# Patient Record
Sex: Female | Born: 1935 | Race: White | Hispanic: No | Marital: Married | Smoking: Former smoker
Health system: Southern US, Community
[De-identification: ages and names within clinical notes are randomized; demographics above are authoritative.]

## PROBLEM LIST (undated history)

## (undated) DIAGNOSIS — I714 Abdominal aortic aneurysm, without rupture, unspecified: Secondary | ICD-10-CM

## (undated) DIAGNOSIS — J449 Chronic obstructive pulmonary disease, unspecified: Secondary | ICD-10-CM

## (undated) DIAGNOSIS — K529 Noninfective gastroenteritis and colitis, unspecified: Secondary | ICD-10-CM

---

## 2017-07-10 ENCOUNTER — Other Ambulatory Visit: Payer: Self-pay

## 2017-07-10 ENCOUNTER — Emergency Department (HOSPITAL_COMMUNITY): Payer: Medicare FFS

## 2017-07-10 ENCOUNTER — Inpatient Hospital Stay (HOSPITAL_COMMUNITY)
Admission: EM | Admit: 2017-07-10 | Discharge: 2017-07-15 | DRG: 872 | Disposition: A | Discharge status: Home / Self Care | Payer: Medicare FFS | Attending: Internal Medicine | Admitting: Internal Medicine

## 2017-07-10 ENCOUNTER — Encounter (HOSPITAL_COMMUNITY): Payer: Self-pay

## 2017-07-10 DIAGNOSIS — I10 Essential (primary) hypertension: Secondary | ICD-10-CM | POA: Diagnosis present

## 2017-07-10 DIAGNOSIS — A419 Sepsis, unspecified organism: Secondary | ICD-10-CM | POA: Diagnosis not present

## 2017-07-10 DIAGNOSIS — J432 Centrilobular emphysema: Secondary | ICD-10-CM | POA: Diagnosis present

## 2017-07-10 DIAGNOSIS — Z79899 Other long term (current) drug therapy: Secondary | ICD-10-CM | POA: Diagnosis not present

## 2017-07-10 DIAGNOSIS — E785 Hyperlipidemia, unspecified: Secondary | ICD-10-CM | POA: Diagnosis present

## 2017-07-10 DIAGNOSIS — K5900 Constipation, unspecified: Secondary | ICD-10-CM | POA: Diagnosis present

## 2017-07-10 DIAGNOSIS — I4510 Unspecified right bundle-branch block: Secondary | ICD-10-CM | POA: Diagnosis present

## 2017-07-10 DIAGNOSIS — R630 Anorexia: Secondary | ICD-10-CM | POA: Diagnosis not present

## 2017-07-10 DIAGNOSIS — Z9981 Dependence on supplemental oxygen: Secondary | ICD-10-CM | POA: Diagnosis not present

## 2017-07-10 DIAGNOSIS — Z87891 Personal history of nicotine dependence: Secondary | ICD-10-CM | POA: Diagnosis not present

## 2017-07-10 DIAGNOSIS — D649 Anemia, unspecified: Secondary | ICD-10-CM | POA: Diagnosis present

## 2017-07-10 DIAGNOSIS — N390 Urinary tract infection, site not specified: Secondary | ICD-10-CM | POA: Diagnosis present

## 2017-07-10 DIAGNOSIS — Z6820 Body mass index (BMI) 20.0-20.9, adult: Secondary | ICD-10-CM | POA: Diagnosis not present

## 2017-07-10 DIAGNOSIS — J90 Pleural effusion, not elsewhere classified: Secondary | ICD-10-CM | POA: Diagnosis present

## 2017-07-10 DIAGNOSIS — I714 Abdominal aortic aneurysm, without rupture: Secondary | ICD-10-CM | POA: Diagnosis present

## 2017-07-10 DIAGNOSIS — R627 Adult failure to thrive: Secondary | ICD-10-CM

## 2017-07-10 DIAGNOSIS — J449 Chronic obstructive pulmonary disease, unspecified: Secondary | ICD-10-CM | POA: Diagnosis not present

## 2017-07-10 DIAGNOSIS — J9811 Atelectasis: Secondary | ICD-10-CM | POA: Diagnosis present

## 2017-07-10 DIAGNOSIS — E876 Hypokalemia: Secondary | ICD-10-CM | POA: Diagnosis present

## 2017-07-10 DIAGNOSIS — J9611 Chronic respiratory failure with hypoxia: Secondary | ICD-10-CM | POA: Diagnosis present

## 2017-07-10 DIAGNOSIS — K5732 Diverticulitis of large intestine without perforation or abscess without bleeding: Secondary | ICD-10-CM | POA: Diagnosis present

## 2017-07-10 DIAGNOSIS — K769 Liver disease, unspecified: Secondary | ICD-10-CM | POA: Diagnosis present

## 2017-07-10 DIAGNOSIS — R05 Cough: Secondary | ICD-10-CM

## 2017-07-10 DIAGNOSIS — E440 Moderate protein-calorie malnutrition: Secondary | ICD-10-CM | POA: Diagnosis not present

## 2017-07-10 DIAGNOSIS — K529 Noninfective gastroenteritis and colitis, unspecified: Secondary | ICD-10-CM | POA: Diagnosis present

## 2017-07-10 DIAGNOSIS — R059 Cough, unspecified: Secondary | ICD-10-CM

## 2017-07-10 DIAGNOSIS — R634 Abnormal weight loss: Secondary | ICD-10-CM

## 2017-07-10 HISTORY — DX: Abdominal aortic aneurysm, without rupture, unspecified: I71.40

## 2017-07-10 HISTORY — DX: Noninfective gastroenteritis and colitis, unspecified: K52.9

## 2017-07-10 HISTORY — DX: Abdominal aortic aneurysm, without rupture: I71.4

## 2017-07-10 HISTORY — DX: Chronic obstructive pulmonary disease, unspecified: J44.9

## 2017-07-10 LAB — URINALYSIS, ROUTINE W REFLEX MICROSCOPIC
Bilirubin Urine: NEGATIVE
Glucose, UA: NEGATIVE mg/dL
Ketones, ur: 80 mg/dL — AB
Nitrite: NEGATIVE
Protein, ur: NEGATIVE mg/dL
Specific Gravity, Urine: >1.046 — ABNORMAL HIGH (ref 1.005–1.030)
pH: 5 (ref 5.0–8.0)

## 2017-07-10 LAB — CBC WITH DIFFERENTIAL/PLATELET
BASOS ABS: 0 10*3/uL (ref 0.0–0.1)
BASOS PCT: 0 %
EOS ABS: 0 10*3/uL (ref 0.0–0.7)
EOS PCT: 0 %
HCT: 35 % — ABNORMAL LOW (ref 36.0–46.0)
Hemoglobin: 11.2 g/dL — ABNORMAL LOW (ref 12.0–15.0)
Lymphocytes Relative: 8 %
Lymphs Abs: 1 10*3/uL (ref 0.7–4.0)
MCH: 28.6 pg (ref 26.0–34.0)
MCHC: 32 g/dL (ref 30.0–36.0)
MCV: 89.3 fL (ref 78.0–100.0)
Monocytes Absolute: 0.9 10*3/uL (ref 0.1–1.0)
Monocytes Relative: 6 %
NEUTROS PCT: 86 %
Neutro Abs: 11.9 10*3/uL (ref 1.7–7.7)
PLATELETS: 514 10*3/uL — AB (ref 150–400)
RBC: 3.92 MIL/uL (ref 3.87–5.11)
RDW: 18.9 % — AB (ref 11.5–15.5)
WBC: 13.9 10*3/uL — AB (ref 4.0–10.5)

## 2017-07-10 LAB — COMPREHENSIVE METABOLIC PANEL
ALK PHOS: 79 U/L (ref 38–126)
ALT: 16 U/L (ref 14–54)
AST: 15 U/L (ref 15–41)
Albumin: 1.5 g/dL — ABNORMAL LOW (ref 3.5–5.0)
Anion gap: 13 (ref 5–15)
BILIRUBIN TOTAL: 1.1 mg/dL (ref 0.3–1.2)
BUN: 15 mg/dL (ref 6–20)
CALCIUM: 8.2 mg/dL — AB (ref 8.9–10.3)
CO2: 24 mmol/L (ref 22–32)
CREATININE: 0.69 mg/dL (ref 0.44–1.00)
Chloride: 100 mmol/L — ABNORMAL LOW (ref 101–111)
Glucose, Bld: 69 mg/dL (ref 65–99)
Potassium: 3.7 mmol/L (ref 3.5–5.1)
SODIUM: 137 mmol/L (ref 135–145)
TOTAL PROTEIN: 5.1 g/dL — AB (ref 6.5–8.1)

## 2017-07-10 LAB — I-STAT CG4 LACTIC ACID, ED: LACTIC ACID, VENOUS: 1.04 mmol/L (ref 0.5–1.9)

## 2017-07-10 LAB — C DIFFICILE QUICK SCREEN W PCR REFLEX
C Diff antigen: NEGATIVE
C Diff interpretation: NOT DETECTED
C Diff toxin: NEGATIVE

## 2017-07-10 LAB — MAGNESIUM: Magnesium: 2 mg/dL (ref 1.7–2.4)

## 2017-07-10 LAB — I-STAT TROPONIN, ED: TROPONIN I, POC: 0.03 ng/mL (ref 0.00–0.08)

## 2017-07-10 LAB — LIPASE, BLOOD: Lipase: 25 U/L (ref 11–51)

## 2017-07-10 LAB — CBG MONITORING, ED: Glucose-Capillary: 53 mg/dL — ABNORMAL LOW (ref 65–99)

## 2017-07-10 LAB — PROTIME-INR
INR: 1
PROTHROMBIN TIME: 13.1 s (ref 11.4–15.2)

## 2017-07-10 LAB — PHOSPHORUS: PHOSPHORUS: 2.3 mg/dL — AB (ref 2.5–4.6)

## 2017-07-10 LAB — BRAIN NATRIURETIC PEPTIDE: B NATRIURETIC PEPTIDE 5: 130.9 pg/mL — AB (ref 0.0–100.0)

## 2017-07-10 MED ORDER — ACETAMINOPHEN 650 MG RE SUPP: 650.0000 mg | Freq: Four times a day (QID) | RECTAL | Status: DC | PRN

## 2017-07-10 MED ORDER — IOPAMIDOL (ISOVUE-300) INJECTION 61%
100.0000 mL | Freq: Once | INTRAVENOUS | Status: AC | PRN
  Administered 2017-07-10: 100 mL via INTRAVENOUS

## 2017-07-10 MED ORDER — DEXTROSE-NACL 5-0.9 % IV SOLN
INTRAVENOUS | Status: DC
  Administered 2017-07-10: 14:00:00 via INTRAVENOUS

## 2017-07-10 MED ORDER — ONDANSETRON HCL 4 MG PO TABS: 4.0000 mg | ORAL_TABLET | Freq: Four times a day (QID) | ORAL | Status: DC | PRN

## 2017-07-10 MED ORDER — METRONIDAZOLE IN NACL 5-0.79 MG/ML-% IV SOLN
500.0000 mg | Freq: Once | INTRAVENOUS | Status: AC
  Administered 2017-07-10: 500 mg via INTRAVENOUS
  Filled 2017-07-10: qty 100

## 2017-07-10 MED ORDER — SODIUM CHLORIDE 0.9 % IV SOLN
INTRAVENOUS | Status: AC
  Administered 2017-07-10 – 2017-07-11 (×2): via INTRAVENOUS

## 2017-07-10 MED ORDER — MIRTAZAPINE 15 MG PO TABS
7.5000 mg | ORAL_TABLET | ORAL | Status: DC
  Administered 2017-07-10 – 2017-07-14 (×5): 7.5 mg via ORAL
  Filled 2017-07-10 (×5): qty 1

## 2017-07-10 MED ORDER — PRAVASTATIN SODIUM 40 MG PO TABS
40.0000 mg | ORAL_TABLET | Freq: Every day | ORAL | Status: DC
  Administered 2017-07-10 – 2017-07-15 (×6): 40 mg via ORAL
  Filled 2017-07-10 (×6): qty 1

## 2017-07-10 MED ORDER — ENOXAPARIN SODIUM 40 MG/0.4ML ~~LOC~~ SOLN
40.0000 mg | SUBCUTANEOUS | Status: DC
Start: 2017-07-10 — End: 2017-07-15
  Administered 2017-07-10 – 2017-07-14 (×4): 40 mg via SUBCUTANEOUS
  Filled 2017-07-10 (×4): qty 0.4

## 2017-07-10 MED ORDER — ONDANSETRON HCL 4 MG/2ML IJ SOLN: 4.0000 mg | Freq: Four times a day (QID) | INTRAMUSCULAR | Status: DC | PRN

## 2017-07-10 MED ORDER — SODIUM CHLORIDE 0.9 % IV SOLN
1.5000 g | Freq: Four times a day (QID) | INTRAVENOUS | Status: DC
  Administered 2017-07-10 – 2017-07-11 (×5): 1.5 g via INTRAVENOUS
  Administered 2017-07-12: 06:00:00 via INTRAVENOUS
  Administered 2017-07-12 – 2017-07-14 (×9): 1.5 g via INTRAVENOUS
  Filled 2017-07-10 (×16): qty 1.5

## 2017-07-10 MED ORDER — IOPAMIDOL (ISOVUE-300) INJECTION 61%
INTRAVENOUS | Status: AC
  Filled 2017-07-10: qty 100

## 2017-07-10 MED ORDER — CHOLESTYRAMINE LIGHT 4 G PO PACK
4.0000 g | PACK | Freq: Two times a day (BID) | ORAL | Status: DC
  Administered 2017-07-10 – 2017-07-11 (×3): 4 g via ORAL
  Filled 2017-07-10 (×5): qty 1

## 2017-07-10 MED ORDER — LEVOFLOXACIN IN D5W 500 MG/100ML IV SOLN
500.0000 mg | Freq: Once | INTRAVENOUS | Status: AC
  Administered 2017-07-10: 500 mg via INTRAVENOUS
  Filled 2017-07-10: qty 100

## 2017-07-10 MED ORDER — ACETAMINOPHEN 325 MG PO TABS
650.0000 mg | ORAL_TABLET | Freq: Four times a day (QID) | ORAL | Status: DC | PRN
  Administered 2017-07-15: 650 mg via ORAL
  Filled 2017-07-10: qty 2

## 2017-07-10 NOTE — ED Provider Notes (Signed)
[facility] DEPT Provider Note   CSN: [id number] Arrival date & time: 07/10/17  0931     History   Chief Complaint Chief Complaint  Patient presents with  . Weakness  . Emesis    HPI Melissa Riddle is a 84 y.o. female.  HPI Had nearly a year worth of decreasing weight and loss of appetite.  His daughter reports she has become increasingly weak and can no longer independently bear weight and perform ADLs.  Patient has been hospitalized in [place] several times with pneumonia and COPD.  They attempted to do a rehabilitation facility but the patient did not improve.  Her daughter reports that she is supposed to get a colonoscopy but has not been able to get adequately strong to proceed with other diagnostic testing.  She reports due to her inability to take care of herself, she has moved her to the [place] area to take care of her in her home.  She brings her in today for concern for increased cough.  Over the past 2 days patient developed increasing wet cough.  Generalized weakness has worsened.  No fever is developed.  Patient has severe swelling of her legs.  This has been developing for several weeks.  His daughter is concerned for her mother's progressive loss of appetite, weight loss and debilitating weakness.  Her daughter feels that these issues had not been addressed by her caregivers in [place] and that as she continued to decline she had to move her closer for care.  The patient denies any point time she suffers from pain.  She reports she notes that she is extremely weak and has no appetite but does not complain of any pain.  Reports that she did have a large amount of loose diarrheal stool for several weeks.  She does advised that she was tested twice for C. difficile and tested negative.  She was put on cholestran which she reports has firmed the stool up though it is still not formed.  She is no longer having watery diarrhea. Past Medical History:   Diagnosis Date  . Abdominal aneurysm ([place])   . COPD (chronic obstructive pulmonary disease) ([facility])     There are no active problems to display for this patient.      OB History   None      Home Medications    Prior to Admission medications   Medication Sig Start Date End Date Taking? Authorizing Provider  albuterol (PROVENTIL HFA;VENTOLIN HFA) 108 (90 Base) MCG/ACT inhaler Inhale 2 puffs into the lungs daily.   Yes [provider]  albuterol (PROVENTIL) (2.5 MG/3ML) 0.083% nebulizer solution Take 2.5 mg by nebulization every 6 (six) hours as needed for wheezing or shortness of breath.   Yes [provider]  cholestyramine (QUESTRAN) 4 g packet Take 4 g by mouth 2 (two) times daily.   Yes [provider]  lovastatin (MEVACOR) 40 MG tablet Take 40 mg by mouth at bedtime.   Yes [provider]  promethazine (PHENERGAN) 25 MG tablet Take 25 mg by mouth every 6 (six) hours as needed for nausea or vomiting.   Yes [provider]    Family History History reviewed. No pertinent family history.  Social History Social History   Tobacco Use  . Smoking status: Former Smoker    Packs/day: 1.50    Years: 70.00    Pack years: 105.00    Types: Cigarettes  . Smokeless tobacco: Never Used  Substance Use Topics  .  Alcohol use: Never    Frequency: Never  . Drug use: Never     Allergies   Patient has no known allergies.   Review of Systems Review of Systems 10 Systems reviewed and are negative for acute change except as noted in the HPI.   Physical Exam Updated Vital Signs BP (!) 99/51   Pulse 97   Temp 97.7 F (36.5 C) (Oral)   Resp 18   Ht 5\' 4"  (1.626 m)   Wt 53.5 kg (118 lb)   SpO2 100%   BMI 20.25 kg/m   Physical Exam  Constitutional: She is oriented to person, place, and time.  Is frail in appearance.  She has frequent wet cough.  She is alert.  She does not have respiratory distress at rest.  HENT:  Head:  Normocephalic and atraumatic.  Mucous membranes slightly dry.  Poor dentition.  Eyes: Pupils are equal, round, and reactive to light. EOM are normal.  Neck: Neck supple.  Cardiovascular:  Borderline tachycardia.  No gross rub murmur gallop.  Pulmonary/Chest:  Frequent wet cough.  Very poor airflow to the bases.  Scattered rhonchi and expiratory wheeze coarse in nature.  Abdominal: Soft. Bowel sounds are normal. She exhibits no distension. There is no tenderness. There is no guarding.  Musculoskeletal:  3+ pitting edema left lower extremity.  2+ pitting edema right lower extremity.  Skin is very thin and fragile.  Patient also appears to have subtle edema of the right upper extremity.  Neurological: She is alert and oriented to person, place, and time. No cranial nerve deficit.  Patient is generally weak but does not have focal neurologic deficit of motor extremity.  He needs assistance to lean forward in the bed.  Skin:  Skin is thin   Psychiatric: She has a normal mood and affect.     ED Treatments / Results  Labs (all labs ordered are listed, but only abnormal results are displayed) Labs Reviewed  COMPREHENSIVE METABOLIC PANEL - Abnormal; Notable for the following components:      Result Value   Chloride 100 (*)    Calcium 8.2 (*)    Total Protein 5.1 (*)    Albumin 1.5 (*)    All other components within normal limits  BRAIN NATRIURETIC PEPTIDE - Abnormal; Notable for the following components:   B Natriuretic Peptide 130.9 (*)    All other components within normal limits  CBC WITH DIFFERENTIAL/PLATELET - Abnormal; Notable for the following components:   WBC 13.9 (*)    Hemoglobin 11.2 (*)    HCT 35.0 (*)    RDW 18.9 (*)    Platelets 514 (*)    All other components within normal limits  PHOSPHORUS - Abnormal; Notable for the following components:   Phosphorus 2.3 (*)    All other components within normal limits  CBG MONITORING, ED - Abnormal; Notable for the following  components:   Glucose-Capillary 53 (*)    All other components within normal limits  URINE CULTURE  CULTURE, BLOOD (ROUTINE X 2)  CULTURE, BLOOD (ROUTINE X 2)  LIPASE, BLOOD  PROTIME-INR  MAGNESIUM  URINALYSIS, ROUTINE W REFLEX MICROSCOPIC  I-STAT CG4 LACTIC ACID, ED  I-STAT TROPONIN, ED    EKG EKG Interpretation  Date/Time:  Friday Jul 10 2017 10:22:05 EDT Ventricular Rate:  99 PR Interval:    QRS Duration: 116 QT Interval:  352 QTC Calculation: 452 R Axis:   -79 Text Interpretation:  Sinus rhythm Incomplete right bundle branch block Low  voltage, extremity and precordial leads agree. no STEMI. no old comparison Confirmed by Charlesetta Shanks [phone]) on 07/10/2017 1:50:46 PM   Radiology Ct Chest W Contrast  Result Date: 07/10/2017 CLINICAL DATA:  Weakness, shortness of breath, and weight loss. History of breast cancer. EXAM: CT CHEST, ABDOMEN, AND PELVIS WITH CONTRAST TECHNIQUE: Multidetector CT imaging of the chest, abdomen and pelvis was performed following the standard protocol during bolus administration of intravenous contrast. CONTRAST:  180mL ISOVUE-300 IOPAMIDOL (ISOVUE-300) INJECTION 61% COMPARISON:  None. FINDINGS: CT CHEST FINDINGS Cardiovascular: Normal heart size. No pericardial effusion. Normal caliber thoracic aorta. Coronary, aortic arch, and branch vessel atherosclerotic vascular disease. No pulmonary embolism. Mediastinum/Nodes: No enlarged mediastinal, hilar, or axillary lymph nodes. Prior left axillary lymph node dissection. Thyroid gland, trachea, and esophagus demonstrate no significant findings. Small spiculated, calcified lesion at the right cardiophrenic angle is of unclear etiology, possibly an old calcified lymph node, but likely of no clinical significance. Lungs/Pleura: Small left greater than right pleural effusions. Severe upper lobe predominant centrilobular emphysema. Mild lower lobe predominant peribronchial thickening. Biapical pleuroparenchymal scarring.  Scattered minimally increased subpleural reticulation and peribronchovascular nodularity involving both lungs, sparing the right middle lobe. No consolidation or pneumothorax. No suspicious pulmonary nodule. Focal area of bronchiectasis in the left upper lobe is likely postinfectious or postinflammatory. Musculoskeletal: Prior left mastectomy. No acute or significant osseous findings. CT ABDOMEN PELVIS FINDINGS Hepatobiliary: 2.0 cm hypodense lesion in segment 5 of the liver. Status post cholecystectomy. No biliary dilatation. Pancreas: Moderate pancreatic atrophy. No ductal dilatation or surrounding inflammatory changes. Spleen: Normal in size. Multiple small calcifications, consistent with prior granulomatous disease. Adrenals/Urinary Tract: The adrenal glands are unremarkable. Moderate bilateral renal cortical atrophy. No focal renal lesion. No renal or ureteral calculi. No hydronephrosis. The bladder is unremarkable. Stomach/Bowel: Stomach is within normal limits. Appendix is not definitively visualized, however there are no secondary signs of inflammation in the right lower quadrant. 2.7 cm duodenal diverticulum arising from the second portion. No bowel obstruction. Extensive sigmoid diverticulosis. Mild wall thickening of the sigmoid colon with mild surrounding fat stranding. Moderate stool throughout the colon. Vascular/Lymphatic: There are two fusiform infrarenal abdominal aortic aneurysms. The more superior aneurysm measures 4.5 cm in maximum dimension, while the more inferior aneurysm measures up to 3.3 cm in maximum dimension. Extensive aortoiliac atherosclerotic vascular calcifications. Moderate to severe stenosis of the right renal artery origin. No enlarged abdominal or pelvic lymph nodes. Reproductive: Uterus and bilateral adnexa are unremarkable for the patient's age. Other: No abdominal wall hernia or pneumoperitoneum. No fluid collection. Musculoskeletal: No acute or significant osseous findings.  Old L4 compression deformity status post cement augmentation. IMPRESSION: Chest: 1.  No acute intrathoracic process. 2. Small left greater than right pleural effusions. 3. COPD and chronic small airways disease.  Emphysema (ICD10-J43.9). 4.  Aortic atherosclerosis (ICD10-I70.0). Abdomen and pelvis: 1. Extensive sigmoid diverticulosis with mild sigmoid colon wall thickening and surrounding inflammatory changes, suspicious for mild sigmoid diverticulitis. No evidence of perforation or drainable fluid collection. 2. Indeterminate 2.0 cm hypodense lesion in segment 5 of the liver. Recommend further evaluation with ultrasound or nonemergent MRI of the liver with and without contrast (if the patient can hold her breath appropriately). 3. Two fusiform infrarenal abdominal aortic aneurysms measuring up to 4.5 cm in maximal dimension. Recommend followup by abdomen and pelvis CTA in 6 months, and vascular surgery referral/consultation if not already obtained. This recommendation follows ACR consensus guidelines: White Paper of the ACR Incidental Findings Committee II on Vascular Findings. J  Am Coll Radiol 2013; 10:789-794. Electronically Signed   By: Titus Dubin M.D.   On: 07/10/2017 13:15   Ct Abdomen Pelvis W Contrast  Result Date: 07/10/2017 CLINICAL DATA:  Weakness, shortness of breath, and weight loss. History of breast cancer. EXAM: CT CHEST, ABDOMEN, AND PELVIS WITH CONTRAST TECHNIQUE: Multidetector CT imaging of the chest, abdomen and pelvis was performed following the standard protocol during bolus administration of intravenous contrast. CONTRAST:  151mL ISOVUE-300 IOPAMIDOL (ISOVUE-300) INJECTION 61% COMPARISON:  None. FINDINGS: CT CHEST FINDINGS Cardiovascular: Normal heart size. No pericardial effusion. Normal caliber thoracic aorta. Coronary, aortic arch, and branch vessel atherosclerotic vascular disease. No pulmonary embolism. Mediastinum/Nodes: No enlarged mediastinal, hilar, or axillary lymph nodes.  Prior left axillary lymph node dissection. Thyroid gland, trachea, and esophagus demonstrate no significant findings. Small spiculated, calcified lesion at the right cardiophrenic angle is of unclear etiology, possibly an old calcified lymph node, but likely of no clinical significance. Lungs/Pleura: Small left greater than right pleural effusions. Severe upper lobe predominant centrilobular emphysema. Mild lower lobe predominant peribronchial thickening. Biapical pleuroparenchymal scarring. Scattered minimally increased subpleural reticulation and peribronchovascular nodularity involving both lungs, sparing the right middle lobe. No consolidation or pneumothorax. No suspicious pulmonary nodule. Focal area of bronchiectasis in the left upper lobe is likely postinfectious or postinflammatory. Musculoskeletal: Prior left mastectomy. No acute or significant osseous findings. CT ABDOMEN PELVIS FINDINGS Hepatobiliary: 2.0 cm hypodense lesion in segment 5 of the liver. Status post cholecystectomy. No biliary dilatation. Pancreas: Moderate pancreatic atrophy. No ductal dilatation or surrounding inflammatory changes. Spleen: Normal in size. Multiple small calcifications, consistent with prior granulomatous disease. Adrenals/Urinary Tract: The adrenal glands are unremarkable. Moderate bilateral renal cortical atrophy. No focal renal lesion. No renal or ureteral calculi. No hydronephrosis. The bladder is unremarkable. Stomach/Bowel: Stomach is within normal limits. Appendix is not definitively visualized, however there are no secondary signs of inflammation in the right lower quadrant. 2.7 cm duodenal diverticulum arising from the second portion. No bowel obstruction. Extensive sigmoid diverticulosis. Mild wall thickening of the sigmoid colon with mild surrounding fat stranding. Moderate stool throughout the colon. Vascular/Lymphatic: There are two fusiform infrarenal abdominal aortic aneurysms. The more superior aneurysm  measures 4.5 cm in maximum dimension, while the more inferior aneurysm measures up to 3.3 cm in maximum dimension. Extensive aortoiliac atherosclerotic vascular calcifications. Moderate to severe stenosis of the right renal artery origin. No enlarged abdominal or pelvic lymph nodes. Reproductive: Uterus and bilateral adnexa are unremarkable for the patient's age. Other: No abdominal wall hernia or pneumoperitoneum. No fluid collection. Musculoskeletal: No acute or significant osseous findings. Old L4 compression deformity status post cement augmentation. IMPRESSION: Chest: 1.  No acute intrathoracic process. 2. Small left greater than right pleural effusions. 3. COPD and chronic small airways disease.  Emphysema (ICD10-J43.9). 4.  Aortic atherosclerosis (ICD10-I70.0). Abdomen and pelvis: 1. Extensive sigmoid diverticulosis with mild sigmoid colon wall thickening and surrounding inflammatory changes, suspicious for mild sigmoid diverticulitis. No evidence of perforation or drainable fluid collection. 2. Indeterminate 2.0 cm hypodense lesion in segment 5 of the liver. Recommend further evaluation with ultrasound or nonemergent MRI of the liver with and without contrast (if the patient can hold her breath appropriately). 3. Two fusiform infrarenal abdominal aortic aneurysms measuring up to 4.5 cm in maximal dimension. Recommend followup by abdomen and pelvis CTA in 6 months, and vascular surgery referral/consultation if not already obtained. This recommendation follows ACR consensus guidelines: White Paper of the ACR Incidental Findings Committee II on Vascular Findings. J Am  Coll Radiol 2013; 10:789-794. Electronically Signed   By: Titus Dubin M.D.   On: 07/10/2017 13:15    Procedures Procedures (including critical care time)  Medications Ordered in ED Medications  iopamidol (ISOVUE-300) 61 % injection (has no administration in time range)  dextrose 5 %-0.9 % sodium chloride infusion ( Intravenous New  Bag/Given 07/10/17 1331)  levofloxacin (LEVAQUIN) IVPB 500 mg (has no administration in time range)  metroNIDAZOLE (FLAGYL) IVPB 500 mg (has no administration in time range)  iopamidol (ISOVUE-300) 61 % injection 100 mL (100 mLs Intravenous Contrast Given 07/10/17 1232)     Initial Impression / Assessment and Plan / ED Course  I have reviewed the triage vital signs and the nursing notes.  Pertinent labs & imaging results that were available during my care of the patient were reviewed by me and considered in my medical decision making (see chart for details).    Consult: [facility] hospitalist Dr. Herbert Moors for admission.  Final Clinical Impressions(s) / ED Diagnoses   Final diagnoses:  Sigmoid diverticulitis  Chronic obstructive pulmonary disease, unspecified COPD type ([place])  Weight loss  Anorexia   Patient presents as outlined above.  He has multifactorial chronic medical problems that may be contributing to the patient's overall condition.  She however has acutely worsened with inability to tolerate oral intake and too weak to walk independently per the patient's daughter.  Positive findings today include sigmoid diverticulitis and leukocytosis.  Reportedly, patient has had 2 C. difficile tests that have been negative.  He has however had direct exposure via her husband and previous antibiotics.  Reportedly at this time however stool is not liquid.  Plan will be to admit for diverticulitis with decreased oral intake and chronic COPD with exacerbation. ED Discharge Orders    None       Charlesetta Shanks, MD 07/10/17 1358

## 2017-07-10 NOTE — ED Notes (Signed)
Attempted second blood culture but unable to obtain. RN aware.

## 2017-07-10 NOTE — ED Triage Notes (Addendum)
Patient BIB EMS from home with complaints of N/V and generalized weakness. Patient family reports the patient has had decreased PO intake for the past year. In the past week, patient has started vomiting any PO intake. Patient denies any specific pain. Patient recently discharged from rehab facility, per EMS report. Patient has history of COPD and lives on [address]. Patient has history of left sided mastectomy. Patient also complains of new onset unproductive, but congested cough

## 2017-07-10 NOTE — ED Notes (Signed)
Bed: WA04 Expected date:  Expected time:  Means of arrival:  Comments: EMS-N/V

## 2017-07-10 NOTE — ED Notes (Signed)
Unable to obtain 2nd blood culture. EDMD Pfeiffer aware.

## 2017-07-10 NOTE — H&P (Signed)
History and Physical    Quynh Basso RCV:[id number] DOB: 09/04/1928 DOA: 07/10/2017  PCP: Patient, No Pcp Per   Patient coming from: home    Chief Complaint: N/V, FTT  HPI: Melissa Riddle is a 84 y.o. female with medical history significant of COPD on 2.5 L nasal cannula, chronic diarrhea of unclear etiology on cholestyramine, hyperlipidemia who comes in with 1 week of nausea, vomiting, 1 year of diarrhea and failure to thrive.  On discussion with the daughter patient has had approximately 1 year of dominantly diarrhea sometimes alternating with constipation.  She was pending an outpatient GI evaluation however she was never medically stable enough to receive it.  She apparently was hospitalized in March 17 to March 29 at outside hospital in [place] with a "blockage" and then discharged for approximately 2 weeks to rehab.  Since then her daughter reports that the patient has progressively gotten weaker.  She could normally get around with a walker but is now been unable to get out of bed.  The patient apparently used to live with her husband of 32 years however her daughter recently brought her to [facility] approximately 1 week ago when she did not feel like the patient was safe at home.  The patient can barely perform most ADLs including toileting, transfer, making her own food.  Since the week that her daughter took her home she has had 3 episodes of nonbilious nonbloody emesis.  These are usually in the setting of eating food.  She has not had any abdominal pain and adamantly denies this.  She has had significantly decreased p.o. intake for the past week as well.  She denies any fevers, cough, congestion, shortness of breath, orthopnea, lower extremity edema, paroxysmal nocturnal dyspnea, rash.  ED Course: In the ED patient's vitals are notable for mild hypertension at 91/51 and heart rate of 104.  Patient's labs are notable for a white blood cell count of 13.9.  Platelets were 514.  CT chest  abdomen pelvis was performed and showed possible subtle sigmoid diverticulitis.  CT chest showed small left greater than right pleural effusions with no acute process.  Patient's albumin was noted to be 1.5.  Patient was admitted for colitis and failure to thrive.  Review of Systems: As per HPI otherwise 10 point review of systems negative.    Past Medical History:  Diagnosis Date  . Abdominal aneurysm ([place])   . Chronic diarrhea 07/10/2017  . COPD (chronic obstructive pulmonary disease) ([place])     History reviewed. No pertinent surgical history.   reports that she has quit smoking. Her smoking use included cigarettes. She has a 105.00 pack-year smoking history. She has never used smokeless tobacco. She reports that she does not drink alcohol or use drugs.  No Known Allergies  History reviewed. No pertinent family history.   Prior to Admission medications   Medication Sig Start Date End Date Taking? Authorizing Provider  albuterol (PROVENTIL HFA;VENTOLIN HFA) 108 (90 Base) MCG/ACT inhaler Inhale 2 puffs into the lungs daily.   Yes [provider]  albuterol (PROVENTIL) (2.5 MG/3ML) 0.083% nebulizer solution Take 2.5 mg by nebulization every 6 (six) hours as needed for wheezing or shortness of breath.   Yes [provider]  cholestyramine (QUESTRAN) 4 g packet Take 4 g by mouth 2 (two) times daily.   Yes [provider]  lovastatin (MEVACOR) 40 MG tablet Take 40 mg by mouth at bedtime.   Yes [provider]  promethazine (PHENERGAN) 25 MG tablet  Take 25 mg by mouth every 6 (six) hours as needed for nausea or vomiting.   Yes [provider]    Physical Exam: Vitals:   07/10/17 1111 07/10/17 1130 07/10/17 1200 07/10/17 1330  BP: 119/62 (!) 103/53 (!) 106/48 (!) 99/51  Pulse: 99 (!) 102 (!) 104 97  Resp:    18  Temp:      TempSrc:      SpO2: 100% 100% 100% 100%  Weight:      Height:        Constitutional: NAD, calm, comfortable Vitals:    07/10/17 1111 07/10/17 1130 07/10/17 1200 07/10/17 1330  BP: 119/62 (!) 103/53 (!) 106/48 (!) 99/51  Pulse: 99 (!) 102 (!) 104 97  Resp:    18  Temp:      TempSrc:      SpO2: 100% 100% 100% 100%  Weight:      Height:       Eyes: Anicteric sclera ENMT: Dry mucous membranes, poor dentition Neck: normal, supple Respiratory: No increased work of breathing, diminished lung sounds at bases, no wheezes, rhonchi, rales Cardiovascular: Stent heart sounds, regular rate and rhythm, no murmurs Abdomen: Soft, nontender, plus bowel sounds, no rebound or guarding Musculoskeletal: 2+ lower extremity edema Skin: Skin tears Neurologic: Mostly intact, moving all extremities Psychiatric: Normal judgment and insight. Alert and oriented x 3. Normal mood.    Labs on Admission: I have personally reviewed following labs and imaging studies  CBC: Recent Labs  Lab 07/10/17 1110  WBC 13.9*  NEUTROABS 11.9  HGB 11.2*  HCT 35.0*  MCV 89.3  PLT 824*   Basic Metabolic Panel: Recent Labs  Lab 07/10/17 1110  NA 137  K 3.7  CL 100*  CO2 24  GLUCOSE 69  BUN 15  CREATININE 0.69  CALCIUM 8.2*  MG 2.0  PHOS 2.3*   GFR: Estimated Creatinine Clearance: 41.8 mL/min (by C-G formula based on SCr of 0.69 mg/dL). Liver Function Tests: Recent Labs  Lab 07/10/17 1110  AST 15  ALT 16  ALKPHOS 79  BILITOT 1.1  PROT 5.1*  ALBUMIN 1.5*   Recent Labs  Lab 07/10/17 1110  LIPASE 25   No results for input(s): AMMONIA in the last 168 hours. Coagulation Profile: Recent Labs  Lab 07/10/17 1110  INR 1.00   Cardiac Enzymes: No results for input(s): CKTOTAL, CKMB, CKMBINDEX, TROPONINI in the last 168 hours. BNP (last 3 results) No results for input(s): PROBNP in the last 8760 hours. HbA1C: No results for input(s): HGBA1C in the last 72 hours. CBG: Recent Labs  Lab 07/10/17 1209  GLUCAP 53*   Lipid Profile: No results for input(s): CHOL, HDL, LDLCALC, TRIG, CHOLHDL, LDLDIRECT in the last  72 hours. Thyroid Function Tests: No results for input(s): TSH, T4TOTAL, FREET4, T3FREE, THYROIDAB in the last 72 hours. Anemia Panel: No results for input(s): VITAMINB12, FOLATE, FERRITIN, TIBC, IRON, RETICCTPCT in the last 72 hours. Urine analysis: No results found for: COLORURINE, APPEARANCEUR, [place], [place], GLUCOSEU, HGBUR, BILIRUBINUR, KETONESUR, PROTEINUR, UROBILINOGEN, NITRITE, LEUKOCYTESUR  Radiological Exams on Admission: Ct Chest W Contrast  Result Date: 07/10/2017 CLINICAL DATA:  Weakness, shortness of breath, and weight loss. History of breast cancer. EXAM: CT CHEST, ABDOMEN, AND PELVIS WITH CONTRAST TECHNIQUE: Multidetector CT imaging of the chest, abdomen and pelvis was performed following the standard protocol during bolus administration of intravenous contrast. CONTRAST:  132mL ISOVUE-300 IOPAMIDOL (ISOVUE-300) INJECTION 61% COMPARISON:  None. FINDINGS: CT CHEST FINDINGS Cardiovascular: Normal heart size. No pericardial effusion.  Normal caliber thoracic aorta. Coronary, aortic arch, and branch vessel atherosclerotic vascular disease. No pulmonary embolism. Mediastinum/Nodes: No enlarged mediastinal, hilar, or axillary lymph nodes. Prior left axillary lymph node dissection. Thyroid gland, trachea, and esophagus demonstrate no significant findings. Small spiculated, calcified lesion at the right cardiophrenic angle is of unclear etiology, possibly an old calcified lymph node, but likely of no clinical significance. Lungs/Pleura: Small left greater than right pleural effusions. Severe upper lobe predominant centrilobular emphysema. Mild lower lobe predominant peribronchial thickening. Biapical pleuroparenchymal scarring. Scattered minimally increased subpleural reticulation and peribronchovascular nodularity involving both lungs, sparing the right middle lobe. No consolidation or pneumothorax. No suspicious pulmonary nodule. Focal area of bronchiectasis in the left upper lobe is likely  postinfectious or postinflammatory. Musculoskeletal: Prior left mastectomy. No acute or significant osseous findings. CT ABDOMEN PELVIS FINDINGS Hepatobiliary: 2.0 cm hypodense lesion in segment 5 of the liver. Status post cholecystectomy. No biliary dilatation. Pancreas: Moderate pancreatic atrophy. No ductal dilatation or surrounding inflammatory changes. Spleen: Normal in size. Multiple small calcifications, consistent with prior granulomatous disease. Adrenals/Urinary Tract: The adrenal glands are unremarkable. Moderate bilateral renal cortical atrophy. No focal renal lesion. No renal or ureteral calculi. No hydronephrosis. The bladder is unremarkable. Stomach/Bowel: Stomach is within normal limits. Appendix is not definitively visualized, however there are no secondary signs of inflammation in the right lower quadrant. 2.7 cm duodenal diverticulum arising from the second portion. No bowel obstruction. Extensive sigmoid diverticulosis. Mild wall thickening of the sigmoid colon with mild surrounding fat stranding. Moderate stool throughout the colon. Vascular/Lymphatic: There are two fusiform infrarenal abdominal aortic aneurysms. The more superior aneurysm measures 4.5 cm in maximum dimension, while the more inferior aneurysm measures up to 3.3 cm in maximum dimension. Extensive aortoiliac atherosclerotic vascular calcifications. Moderate to severe stenosis of the right renal artery origin. No enlarged abdominal or pelvic lymph nodes. Reproductive: Uterus and bilateral adnexa are unremarkable for the patient's age. Other: No abdominal wall hernia or pneumoperitoneum. No fluid collection. Musculoskeletal: No acute or significant osseous findings. Old L4 compression deformity status post cement augmentation. IMPRESSION: Chest: 1.  No acute intrathoracic process. 2. Small left greater than right pleural effusions. 3. COPD and chronic small airways disease.  Emphysema (ICD10-J43.9). 4.  Aortic atherosclerosis  (ICD10-I70.0). Abdomen and pelvis: 1. Extensive sigmoid diverticulosis with mild sigmoid colon wall thickening and surrounding inflammatory changes, suspicious for mild sigmoid diverticulitis. No evidence of perforation or drainable fluid collection. 2. Indeterminate 2.0 cm hypodense lesion in segment 5 of the liver. Recommend further evaluation with ultrasound or nonemergent MRI of the liver with and without contrast (if the patient can hold her breath appropriately). 3. Two fusiform infrarenal abdominal aortic aneurysms measuring up to 4.5 cm in maximal dimension. Recommend followup by abdomen and pelvis CTA in 6 months, and vascular surgery referral/consultation if not already obtained. This recommendation follows ACR consensus guidelines: White Paper of the ACR Incidental Findings Committee II on Vascular Findings. J Am Coll Radiol 2013; 10:789-794. Electronically Signed   By: Titus Dubin M.D.   On: 07/10/2017 13:15   Ct Abdomen Pelvis W Contrast  Result Date: 07/10/2017 CLINICAL DATA:  Weakness, shortness of breath, and weight loss. History of breast cancer. EXAM: CT CHEST, ABDOMEN, AND PELVIS WITH CONTRAST TECHNIQUE: Multidetector CT imaging of the chest, abdomen and pelvis was performed following the standard protocol during bolus administration of intravenous contrast. CONTRAST:  125mL ISOVUE-300 IOPAMIDOL (ISOVUE-300) INJECTION 61% COMPARISON:  None. FINDINGS: CT CHEST FINDINGS Cardiovascular: Normal heart size. No pericardial effusion. Normal  caliber thoracic aorta. Coronary, aortic arch, and branch vessel atherosclerotic vascular disease. No pulmonary embolism. Mediastinum/Nodes: No enlarged mediastinal, hilar, or axillary lymph nodes. Prior left axillary lymph node dissection. Thyroid gland, trachea, and esophagus demonstrate no significant findings. Small spiculated, calcified lesion at the right cardiophrenic angle is of unclear etiology, possibly an old calcified lymph node, but likely of no  clinical significance. Lungs/Pleura: Small left greater than right pleural effusions. Severe upper lobe predominant centrilobular emphysema. Mild lower lobe predominant peribronchial thickening. Biapical pleuroparenchymal scarring. Scattered minimally increased subpleural reticulation and peribronchovascular nodularity involving both lungs, sparing the right middle lobe. No consolidation or pneumothorax. No suspicious pulmonary nodule. Focal area of bronchiectasis in the left upper lobe is likely postinfectious or postinflammatory. Musculoskeletal: Prior left mastectomy. No acute or significant osseous findings. CT ABDOMEN PELVIS FINDINGS Hepatobiliary: 2.0 cm hypodense lesion in segment 5 of the liver. Status post cholecystectomy. No biliary dilatation. Pancreas: Moderate pancreatic atrophy. No ductal dilatation or surrounding inflammatory changes. Spleen: Normal in size. Multiple small calcifications, consistent with prior granulomatous disease. Adrenals/Urinary Tract: The adrenal glands are unremarkable. Moderate bilateral renal cortical atrophy. No focal renal lesion. No renal or ureteral calculi. No hydronephrosis. The bladder is unremarkable. Stomach/Bowel: Stomach is within normal limits. Appendix is not definitively visualized, however there are no secondary signs of inflammation in the right lower quadrant. 2.7 cm duodenal diverticulum arising from the second portion. No bowel obstruction. Extensive sigmoid diverticulosis. Mild wall thickening of the sigmoid colon with mild surrounding fat stranding. Moderate stool throughout the colon. Vascular/Lymphatic: There are two fusiform infrarenal abdominal aortic aneurysms. The more superior aneurysm measures 4.5 cm in maximum dimension, while the more inferior aneurysm measures up to 3.3 cm in maximum dimension. Extensive aortoiliac atherosclerotic vascular calcifications. Moderate to severe stenosis of the right renal artery origin. No enlarged abdominal or  pelvic lymph nodes. Reproductive: Uterus and bilateral adnexa are unremarkable for the patient's age. Other: No abdominal wall hernia or pneumoperitoneum. No fluid collection. Musculoskeletal: No acute or significant osseous findings. Old L4 compression deformity status post cement augmentation. IMPRESSION: Chest: 1.  No acute intrathoracic process. 2. Small left greater than right pleural effusions. 3. COPD and chronic small airways disease.  Emphysema (ICD10-J43.9). 4.  Aortic atherosclerosis (ICD10-I70.0). Abdomen and pelvis: 1. Extensive sigmoid diverticulosis with mild sigmoid colon wall thickening and surrounding inflammatory changes, suspicious for mild sigmoid diverticulitis. No evidence of perforation or drainable fluid collection. 2. Indeterminate 2.0 cm hypodense lesion in segment 5 of the liver. Recommend further evaluation with ultrasound or nonemergent MRI of the liver with and without contrast (if the patient can hold her breath appropriately). 3. Two fusiform infrarenal abdominal aortic aneurysms measuring up to 4.5 cm in maximal dimension. Recommend followup by abdomen and pelvis CTA in 6 months, and vascular surgery referral/consultation if not already obtained. This recommendation follows ACR consensus guidelines: White Paper of the ACR Incidental Findings Committee II on Vascular Findings. J Am Coll Radiol 2013; 10:789-794. Electronically Signed   By: Titus Dubin M.D.   On: 07/10/2017 13:15    EKG: Independently reviewed. Low voltage, right bundle branch block, no acute ST segment changes  Assessment/Plan Principal Problem:   Acute colitis Active Problems:   COPD (chronic obstructive pulmonary disease) ([facility])   HLD (hyperlipidemia)   Chronic diarrhea   FTT (failure to thrive) in adult    #) Acute colitis: Unclear if this is contributing to her failure to thrive or not however she does have an elevated white count and CT  evidence of colitis.  She does not have much abdominal  pain however she does have some nausea and vomiting.  Unclear if this is related to her chronic diarrhea or not however suspect that this is either a separate issue or simply sequelae of this. - IV Unasyn - Gentle IV fluids for 1 day - Follow-up stool studies and C. difficile PCR  #) Failure to thrive: No clear medical reason for this.  She continues to have worsening anorexia, weight loss, significant weakness.  Her mood appears to be so-so.  Her dentition is quite poor but she denies any tooth pain.  She simply reports she is not hungry.  Unclear if there is any reversible component to this failure to thrive.  The daughter understands this and is primarily concerned about whether the patient can care for herself or not.  Patient already has home health and home health PT however has not started the services yet. -PT consult -Nutrition consult -Consult care management and social work  #) Hypoalbuminemia: Patient noted to have quite a low albumin considering that she is otherwise relatively healthy.  Suspect that this is most likely related to her diminished p.o. intake and a marker of her extremely poor nutritional status however will check a UA to see if she has significant amounts of albuminuria.  Would also consider sending off stool studies for fecal alpha-1 antitrypsin to see if she is developed protein losing enteropathy possibly as a consequence of whatever this chronic diarrhea is ongoing.  #) Chronic diarrhea: Unclear etiology for this chronic diarrhea.  Suspect most likely based on her age that IBD is unlikely that microscopic colitis is on the differential.  None of the medications he is on is known to cause this.  Unfortunately diagnosing her is quite difficult as she is on a significant amount of oxygen support at home. -Continue home cholestyramine -Outpatient GI consultation -Stool studies as above  #) Hyperlipidemia: -Continue statin  #) COPD: Unknown Gold stage, patient is on  her chronic 2.5 L nasal cannula -Continue PRN bronchodilators  Fluids: Gentle IV fluids Elect lites: Monitor and supplement Nutrition: Per above nutrition consult and regular diet  Prophylaxis: Enoxaparin  Disposition: Pending improvement of her colitis and evaluation by PT, social work  Full code    Cristy Folks MD [facility]   If 7PM-7AM, please contact night-coverage [email or web address] Password [email or web address]  07/10/2017, 2:07 PM

## 2017-07-10 NOTE — ED Notes (Signed)
Patient transported to CT 

## 2017-07-10 NOTE — ED Notes (Signed)
ED TO INPATIENT HANDOFF REPORT  Name/Age/Gender Melissa Riddle 84 y.o. female  Code Status   Home/SNF/Other Home  Chief Complaint Nausea; Emesis  Level of Care/Admitting Diagnosis ED Disposition    ED Disposition Condition [facility] Area: [place] [[id number]]  Level of Care: Med-Surg [16]  Diagnosis: Acute colitis [[id number]]  Admitting Physician: Cristy Folks [[id number]]  Attending Physician: Cristy Folks [[id number]]  Estimated length of stay: past midnight tomorrow  Certification:: I certify this patient will need inpatient services for at least 2 midnights  PT Class (Do Not Modify): Inpatient [101]  PT Acc Code (Do Not Modify): Private [1]       Medical History Past Medical History:  Diagnosis Date  . Abdominal aneurysm ([place])   . Chronic diarrhea 07/10/2017  . COPD (chronic obstructive pulmonary disease) ([facility])     Allergies No Known Allergies  IV Location/Drains/Wounds Patient Lines/Drains/Airways Status   Active Line/Drains/Airways    Name:   Placement date:   Placement time:   Site:   Days:   Peripheral IV 07/10/17 Right Antecubital   07/10/17    1113    Antecubital   less than 1          Labs/Imaging Results for orders placed or performed during the hospital encounter of 07/10/17 (from the past 48 hour(s))  Lipase, blood     Status: None   Collection Time: 07/10/17 11:10 AM  Result Value Ref Range   Lipase 25 11 - 51 U/L    Comment: Performed at [facility], [place] [address] [place], [place] [address]  Comprehensive metabolic panel     Status: Abnormal   Collection Time: 07/10/17 11:10 AM  Result Value Ref Range   Sodium 137 135 - 145 mmol/L   Potassium 3.7 3.5 - 5.1 mmol/L   Chloride 100 (L) 101 - 111 mmol/L   CO2 24 22 - 32 mmol/L   Glucose, Bld 69 65 - 99 mg/dL   BUN 15 6 - 20 mg/dL   Creatinine, Ser 0.69 0.44 - 1.00 mg/dL   Calcium 8.2 (L) 8.9 - 10.3 mg/dL   Total Protein 5.1 (L)  6.5 - 8.1 g/dL   Albumin 1.5 (L) 3.5 - 5.0 g/dL   AST 15 15 - 41 U/L   ALT 16 14 - 54 U/L   Alkaline Phosphatase 79 38 - 126 U/L   Total Bilirubin 1.1 0.3 - 1.2 mg/dL   GFR calc non Af Amer >60 >60 mL/min   GFR calc Af Amer >60 >60 mL/min    Comment: (NOTE) The eGFR has been calculated using the CKD EPI equation. This calculation has not been validated in all clinical situations. eGFR's persistently <60 mL/min signify possible Chronic Kidney Disease.    Anion gap 13 5 - 15    Comment: Performed at [facility], [place] [address], [place], [place] [address]  Brain natriuretic peptide     Status: Abnormal   Collection Time: 07/10/17 11:10 AM  Result Value Ref Range   B Natriuretic Peptide 130.9 (H) 0.0 - 100.0 pg/mL    Comment: Performed at [facility], [place] [address] [place], [place] [address]  CBC with Differential     Status: Abnormal   Collection Time: 07/10/17 11:10 AM  Result Value Ref Range   WBC 13.9 (H) 4.0 - 10.5 K/uL   RBC 3.92 3.87 - 5.11 MIL/uL   Hemoglobin 11.2 (L) 12.0 - 15.0 g/dL  HCT 35.0 (L) 36.0 - 46.0 %   MCV 89.3 78.0 - 100.0 fL   MCH 28.6 26.0 - 34.0 pg   MCHC 32.0 30.0 - 36.0 g/dL   RDW 18.9 (H) 11.5 - 15.5 %   Platelets 514 (H) 150 - 400 K/uL   Neutrophils Relative % 86 %   Neutro Abs 11.9 1.7 - 7.7 K/uL   Lymphocytes Relative 8 %   Lymphs Abs 1.0 0.7 - 4.0 K/uL   Monocytes Relative 6 %   Monocytes Absolute 0.9 0.1 - 1.0 K/uL   Eosinophils Relative 0 %   Eosinophils Absolute 0.0 0.0 - 0.7 K/uL   Basophils Relative 0 %   Basophils Absolute 0.0 0.0 - 0.1 K/uL   WBC Morphology TOXIC GRANULATION     Comment: Performed at [facility], [place] [address] [place], [place] [address]  Protime-INR     Status: None   Collection Time: 07/10/17 11:10 AM  Result Value Ref Range   Prothrombin Time 13.1 11.4 - 15.2 seconds   INR 1.00     Comment: Performed at [facility], [place] [address] [place], [place] [address]  Magnesium     Status: None   Collection Time: 07/10/17 11:10 AM  Result Value Ref Range   Magnesium 2.0 1.7 - 2.4 mg/dL    Comment: Performed at [facility], [place] [address], [place], [place] [address]  Phosphorus     Status: Abnormal   Collection Time: 07/10/17 11:10 AM  Result Value Ref Range   Phosphorus 2.3 (L) 2.5 - 4.6 mg/dL    Comment: Performed at [facility], [place] [address] [place], [place] [address]  I-stat troponin, ED     Status: None   Collection Time: 07/10/17 11:20 AM  Result Value Ref Range   Troponin i, poc 0.03 0.00 - 0.08 ng/mL   Comment 3            Comment: Due to the release kinetics of cTnI, a negative result within the first hours of the onset of symptoms does not rule out myocardial infarction with certainty. If myocardial infarction is still suspected, repeat the test at appropriate intervals.   I-Stat CG4 Lactic Acid, ED     Status: None   Collection Time: 07/10/17 11:22 AM  Result Value Ref Range   Lactic Acid, Venous 1.04 0.5 - 1.9 mmol/L  CBG monitoring, ED     Status: Abnormal   Collection Time: 07/10/17 12:09 PM  Result Value Ref Range   Glucose-Capillary 53 (L) 65 - 99 mg/dL   Ct Chest W Contrast  Result Date: 07/10/2017 CLINICAL DATA:  Weakness, shortness of breath, and weight loss. History of breast cancer. EXAM: CT CHEST, ABDOMEN, AND PELVIS WITH CONTRAST TECHNIQUE: Multidetector CT imaging of the chest, abdomen and pelvis was performed following the standard protocol during bolus administration of intravenous contrast. CONTRAST:  13m ISOVUE-300 IOPAMIDOL (ISOVUE-300) INJECTION 61% COMPARISON:  None. FINDINGS: CT CHEST FINDINGS Cardiovascular: Normal heart size. No pericardial effusion. Normal caliber thoracic aorta. Coronary, aortic arch, and branch vessel atherosclerotic vascular disease. No pulmonary embolism. Mediastinum/Nodes: No enlarged mediastinal, hilar, or axillary  lymph nodes. Prior left axillary lymph node dissection. Thyroid gland, trachea, and esophagus demonstrate no significant findings. Small spiculated, calcified lesion at the right cardiophrenic angle is of unclear etiology, possibly an old calcified lymph node, but likely of no clinical significance. Lungs/Pleura: Small left greater than right pleural effusions. Severe upper lobe predominant centrilobular  emphysema. Mild lower lobe predominant peribronchial thickening. Biapical pleuroparenchymal scarring. Scattered minimally increased subpleural reticulation and peribronchovascular nodularity involving both lungs, sparing the right middle lobe. No consolidation or pneumothorax. No suspicious pulmonary nodule. Focal area of bronchiectasis in the left upper lobe is likely postinfectious or postinflammatory. Musculoskeletal: Prior left mastectomy. No acute or significant osseous findings. CT ABDOMEN PELVIS FINDINGS Hepatobiliary: 2.0 cm hypodense lesion in segment 5 of the liver. Status post cholecystectomy. No biliary dilatation. Pancreas: Moderate pancreatic atrophy. No ductal dilatation or surrounding inflammatory changes. Spleen: Normal in size. Multiple small calcifications, consistent with prior granulomatous disease. Adrenals/Urinary Tract: The adrenal glands are unremarkable. Moderate bilateral renal cortical atrophy. No focal renal lesion. No renal or ureteral calculi. No hydronephrosis. The bladder is unremarkable. Stomach/Bowel: Stomach is within normal limits. Appendix is not definitively visualized, however there are no secondary signs of inflammation in the right lower quadrant. 2.7 cm duodenal diverticulum arising from the second portion. No bowel obstruction. Extensive sigmoid diverticulosis. Mild wall thickening of the sigmoid colon with mild surrounding fat stranding. Moderate stool throughout the colon. Vascular/Lymphatic: There are two fusiform infrarenal abdominal aortic aneurysms. The more superior  aneurysm measures 4.5 cm in maximum dimension, while the more inferior aneurysm measures up to 3.3 cm in maximum dimension. Extensive aortoiliac atherosclerotic vascular calcifications. Moderate to severe stenosis of the right renal artery origin. No enlarged abdominal or pelvic lymph nodes. Reproductive: Uterus and bilateral adnexa are unremarkable for the patient's age. Other: No abdominal wall hernia or pneumoperitoneum. No fluid collection. Musculoskeletal: No acute or significant osseous findings. Old L4 compression deformity status post cement augmentation. IMPRESSION: Chest: 1.  No acute intrathoracic process. 2. Small left greater than right pleural effusions. 3. COPD and chronic small airways disease.  Emphysema (ICD10-J43.9). 4.  Aortic atherosclerosis (ICD10-I70.0). Abdomen and pelvis: 1. Extensive sigmoid diverticulosis with mild sigmoid colon wall thickening and surrounding inflammatory changes, suspicious for mild sigmoid diverticulitis. No evidence of perforation or drainable fluid collection. 2. Indeterminate 2.0 cm hypodense lesion in segment 5 of the liver. Recommend further evaluation with ultrasound or nonemergent MRI of the liver with and without contrast (if the patient can hold her breath appropriately). 3. Two fusiform infrarenal abdominal aortic aneurysms measuring up to 4.5 cm in maximal dimension. Recommend followup by abdomen and pelvis CTA in 6 months, and vascular surgery referral/consultation if not already obtained. This recommendation follows ACR consensus guidelines: White Paper of the ACR Incidental Findings Committee II on Vascular Findings. J Am Coll Radiol 2013; 10:789-794. Electronically Signed   By: Titus Dubin M.D.   On: 07/10/2017 13:15   Ct Abdomen Pelvis W Contrast  Result Date: 07/10/2017 CLINICAL DATA:  Weakness, shortness of breath, and weight loss. History of breast cancer. EXAM: CT CHEST, ABDOMEN, AND PELVIS WITH CONTRAST TECHNIQUE: Multidetector CT imaging of  the chest, abdomen and pelvis was performed following the standard protocol during bolus administration of intravenous contrast. CONTRAST:  113m ISOVUE-300 IOPAMIDOL (ISOVUE-300) INJECTION 61% COMPARISON:  None. FINDINGS: CT CHEST FINDINGS Cardiovascular: Normal heart size. No pericardial effusion. Normal caliber thoracic aorta. Coronary, aortic arch, and branch vessel atherosclerotic vascular disease. No pulmonary embolism. Mediastinum/Nodes: No enlarged mediastinal, hilar, or axillary lymph nodes. Prior left axillary lymph node dissection. Thyroid gland, trachea, and esophagus demonstrate no significant findings. Small spiculated, calcified lesion at the right cardiophrenic angle is of unclear etiology, possibly an old calcified lymph node, but likely of no clinical significance. Lungs/Pleura: Small left greater than right pleural effusions. Severe upper lobe predominant centrilobular emphysema.  Mild lower lobe predominant peribronchial thickening. Biapical pleuroparenchymal scarring. Scattered minimally increased subpleural reticulation and peribronchovascular nodularity involving both lungs, sparing the right middle lobe. No consolidation or pneumothorax. No suspicious pulmonary nodule. Focal area of bronchiectasis in the left upper lobe is likely postinfectious or postinflammatory. Musculoskeletal: Prior left mastectomy. No acute or significant osseous findings. CT ABDOMEN PELVIS FINDINGS Hepatobiliary: 2.0 cm hypodense lesion in segment 5 of the liver. Status post cholecystectomy. No biliary dilatation. Pancreas: Moderate pancreatic atrophy. No ductal dilatation or surrounding inflammatory changes. Spleen: Normal in size. Multiple small calcifications, consistent with prior granulomatous disease. Adrenals/Urinary Tract: The adrenal glands are unremarkable. Moderate bilateral renal cortical atrophy. No focal renal lesion. No renal or ureteral calculi. No hydronephrosis. The bladder is unremarkable.  Stomach/Bowel: Stomach is within normal limits. Appendix is not definitively visualized, however there are no secondary signs of inflammation in the right lower quadrant. 2.7 cm duodenal diverticulum arising from the second portion. No bowel obstruction. Extensive sigmoid diverticulosis. Mild wall thickening of the sigmoid colon with mild surrounding fat stranding. Moderate stool throughout the colon. Vascular/Lymphatic: There are two fusiform infrarenal abdominal aortic aneurysms. The more superior aneurysm measures 4.5 cm in maximum dimension, while the more inferior aneurysm measures up to 3.3 cm in maximum dimension. Extensive aortoiliac atherosclerotic vascular calcifications. Moderate to severe stenosis of the right renal artery origin. No enlarged abdominal or pelvic lymph nodes. Reproductive: Uterus and bilateral adnexa are unremarkable for the patient's age. Other: No abdominal wall hernia or pneumoperitoneum. No fluid collection. Musculoskeletal: No acute or significant osseous findings. Old L4 compression deformity status post cement augmentation. IMPRESSION: Chest: 1.  No acute intrathoracic process. 2. Small left greater than right pleural effusions. 3. COPD and chronic small airways disease.  Emphysema (ICD10-J43.9). 4.  Aortic atherosclerosis (ICD10-I70.0). Abdomen and pelvis: 1. Extensive sigmoid diverticulosis with mild sigmoid colon wall thickening and surrounding inflammatory changes, suspicious for mild sigmoid diverticulitis. No evidence of perforation or drainable fluid collection. 2. Indeterminate 2.0 cm hypodense lesion in segment 5 of the liver. Recommend further evaluation with ultrasound or nonemergent MRI of the liver with and without contrast (if the patient can hold her breath appropriately). 3. Two fusiform infrarenal abdominal aortic aneurysms measuring up to 4.5 cm in maximal dimension. Recommend followup by abdomen and pelvis CTA in 6 months, and vascular surgery  referral/consultation if not already obtained. This recommendation follows ACR consensus guidelines: White Paper of the ACR Incidental Findings Committee II on Vascular Findings. J Am Coll Radiol 2013; 10:789-794. Electronically Signed   By: Titus Dubin M.D.   On: 07/10/2017 13:15    Pending Labs Unresulted Labs (From admission, onward)   Start     Ordered   07/10/17 1421  C difficile quick scan w PCR reflex  (C Difficile quick screen w PCR reflex panel)  Once, for 24 hours,   R     07/10/17 1420   07/10/17 1421  Gastrointestinal Panel by PCR , Stool  (Gastrointestinal Panel by PCR, Stool)  Once,   R     07/10/17 1420   07/10/17 1048  Urine culture  STAT,   STAT     07/10/17 1049   07/10/17 1048  Culture, blood (routine x 2)  BLOOD CULTURE X 2,   STAT     07/10/17 1049   07/10/17 1001  Urinalysis, Routine w reflex microscopic  Once,   STAT     07/10/17 1001   Signed and Held  Basic metabolic panel  Tomorrow morning,  R     Signed and Held   Signed and Held  CBC  Tomorrow morning,   R     Signed and Held      Vitals/Pain Today's Vitals   07/10/17 1500 07/10/17 1530 07/10/17 1600 07/10/17 1640  BP: (!) 93/53 (!) 94/52 (!) 99/56 (!) 95/59  Pulse: (!) 109 (!) 104 (!) 103 (!) 104  Resp:      Temp:      TempSrc:      SpO2: 92% 94% 100% 100%  Weight:      Height:        Isolation Precautions Enteric precautions (UV disinfection)  Medications Medications  iopamidol (ISOVUE-300) 61 % injection (has no administration in time range)  iopamidol (ISOVUE-300) 61 % injection 100 mL (100 mLs Intravenous Contrast Given 07/10/17 1232)  levofloxacin (LEVAQUIN) IVPB 500 mg (0 mg Intravenous Stopped 07/10/17 1512)  metroNIDAZOLE (FLAGYL) IVPB 500 mg (0 mg Intravenous Stopped 07/10/17 1639)    Mobility non-ambulatory

## 2017-07-11 DIAGNOSIS — J449 Chronic obstructive pulmonary disease, unspecified: Secondary | ICD-10-CM

## 2017-07-11 DIAGNOSIS — R627 Adult failure to thrive: Secondary | ICD-10-CM

## 2017-07-11 DIAGNOSIS — A419 Sepsis, unspecified organism: Principal | ICD-10-CM

## 2017-07-11 DIAGNOSIS — E785 Hyperlipidemia, unspecified: Secondary | ICD-10-CM

## 2017-07-11 DIAGNOSIS — E876 Hypokalemia: Secondary | ICD-10-CM

## 2017-07-11 LAB — GASTROINTESTINAL PANEL BY PCR, STOOL (REPLACES STOOL CULTURE)

## 2017-07-11 LAB — CBC
HCT: 28.9 % — ABNORMAL LOW (ref 36.0–46.0)
Hemoglobin: 9.2 g/dL — ABNORMAL LOW (ref 12.0–15.0)
MCH: 28.7 pg (ref 26.0–34.0)
MCHC: 31.8 g/dL (ref 30.0–36.0)
MCV: 90 fL (ref 78.0–100.0)
Platelets: 402 10*3/uL — ABNORMAL HIGH (ref 150–400)
RBC: 3.21 MIL/uL — ABNORMAL LOW (ref 3.87–5.11)
RDW: 18.4 % — ABNORMAL HIGH (ref 11.5–15.5)
WBC: 10.5 K/uL (ref 4.0–10.5)

## 2017-07-11 LAB — BASIC METABOLIC PANEL WITH GFR
Anion gap: 10 (ref 5–15)
BUN: 12 mg/dL (ref 6–20)
Chloride: 104 mmol/L (ref 101–111)
GFR calc Af Amer: >60 mL/min (ref >60)
GFR calc non Af Amer: >60 mL/min (ref >60)
Potassium: 3.2 mmol/L — ABNORMAL LOW (ref 3.5–5.1)
Sodium: 137 mmol/L (ref 135–145)

## 2017-07-11 LAB — BASIC METABOLIC PANEL
CO2: 23 mmol/L (ref 22–32)
Calcium: 7.5 mg/dL — ABNORMAL LOW (ref 8.9–10.3)
Creatinine, Ser: 0.64 mg/dL (ref 0.44–1.00)
Glucose, Bld: 75 mg/dL (ref 65–99)

## 2017-07-11 NOTE — Evaluation (Signed)
Physical Therapy Evaluation Patient Details Name: Melissa Riddle MRN: [id number] DOB: 31-Jan-1929 Today's Date: 07/11/2017   History of Present Illness  Janika Jedlicka is a 84 y.o. female with medical history significant of COPD on 2.5 L nasal cannula, chronic diarrhea of unclear etiology on cholestyramine, hyperlipidemia who comes in with 1 week of nausea, vomiting, 1 year of diarrhea and failure to thrive.   Clinical Impression  Pt admitted with above diagnosis. Pt currently with functional limitations due to the deficits listed below (see PT Problem List). Pt will benefit from skilled PT to increase their independence and safety with mobility to allow discharge to the venue listed below.  Pt has come to [place] from [place] to be with family after recent rehab stay.  Pt reports they are building a ramp at daughter's house and will have 24 hour A.  She has a rollator, BSC and w/c.  If family can A pt at current level then recommend HHPT, if they are unable to , then will need SNF. Pt was able to stand with MAX A, but unable to take steps.     Follow Up Recommendations Home health PT;Supervision/Assistance - 24 hour    Equipment Recommendations  Other (comment)(hospital bed? continue to assess)    Recommendations for Other Services       Precautions / Restrictions Precautions Precautions: Fall      Mobility  Bed Mobility Overal bed mobility: Needs Assistance Bed Mobility: Supine to Sit     Supine to sit: Min assist     General bed mobility comments: with increased time  Transfers Overall transfer level: Needs assistance   Transfers: Sit to/from Stand;Squat Pivot Transfers Sit to Stand: Max assist   Squat pivot transfers: Max assist     General transfer comment: Pt unable to stand on first attempt.  On 2nd attempt, she was able to stand, but unable to turn feet and had some retropulsion noted.  Dropped arm on recliner and did squat pivot with MAX A.  Ambulation/Gait                 Stairs            Wheelchair Mobility    Modified Rankin (Stroke Patients Only)       Balance Overall balance assessment: Needs assistance Sitting-balance support: Feet supported Sitting balance-Leahy Scale: Fair       Standing balance-Leahy Scale: Poor Standing balance comment: heavy reliance on RW                             Pertinent Vitals/Pain Pain Assessment: No/denies pain    Home Living Family/patient expects to be discharged to:: Private residence Living Arrangements: Children(staying with daughter) Available Help at Discharge: Family;Available 24 hours/day Type of Home: House Home Access: Ramped entrance;Stairs to enter(currently building ramp)   Entrance Stairs-Number of Steps: 2 Home Layout: One level Home Equipment: Walker - 4 wheels;Bedside commode;Wheelchair - manual Additional Comments: home o2 at 3 L    Prior Function Level of Independence: Independent with assistive device(s)   Gait / Transfers Assistance Needed: hasn't walked in over a week. She was in rehab in [place] and didn't feel she got any stronger     Comments: Staying with daughter til she is well enough to return to [place] and her husband     Hand Dominance        Extremity/Trunk Assessment   Upper Extremity Assessment Upper Extremity Assessment: Generalized  weakness    Lower Extremity Assessment Lower Extremity Assessment: Generalized weakness(swelling noted B feet)       Communication   Communication: No difficulties  Cognition Arousal/Alertness: Awake/alert Behavior During Therapy: WFL for tasks assessed/performed Overall Cognitive Status: Within Functional Limits for tasks assessed                                        General Comments General comments (skin integrity, edema, etc.): swelling in B feet    Exercises     Assessment/Plan    PT Assessment Patient needs continued PT services  PT Problem List Decreased  strength;Decreased activity tolerance;Decreased balance;Decreased mobility       PT Treatment Interventions DME instruction;Functional mobility training;Gait training;Therapeutic activities;Therapeutic exercise;Balance training;Patient/family education    PT Goals (Current goals can be found in the Care Plan section)  Acute Rehab PT Goals Patient Stated Goal: go to daughter's house and get therapy there PT Goal Formulation: With patient Time For Goal Achievement: 07/25/17 Potential to Achieve Goals: Good    Frequency Min 3X/week   Barriers to discharge        Co-evaluation               AM-PAC PT "6 Clicks" Daily Activity  Outcome Measure Difficulty turning over in bed (including adjusting bedclothes, sheets and blankets)?: Unable Difficulty moving from lying on back to sitting on the side of the bed? : Unable Difficulty sitting down on and standing up from a chair with arms (e.g., wheelchair, bedside commode, etc,.)?: Unable Help needed moving to and from a bed to chair (including a wheelchair)?: A Lot Help needed walking in hospital room?: Total Help needed climbing 3-5 steps with a railing? : Total 6 Click Score: 7    End of Session Equipment Utilized During Treatment: Gait belt Activity Tolerance: Patient limited by fatigue Patient left: in chair;with call bell/phone within reach;with nursing/sitter in room Nurse Communication: Mobility status PT Visit Diagnosis: Unsteadiness on feet (R26.81);Muscle weakness (generalized) (M62.81)    Time: 1856-3149 PT Time Calculation (min) (ACUTE ONLY): 36 min   Charges:   PT Evaluation $PT Eval Moderate Complexity: 1 Mod PT Treatments $Therapeutic Activity: 8-22 mins   PT G Codes:        Jesiah Yerby L. Tamala Julian, [place] Pager [phone] 07/11/2017   Galen Manila 07/11/2017, 11:13 AM

## 2017-07-11 NOTE — Clinical Social Work Note (Signed)
Clinical Social Work Assessment  Patient Details  Name: Melissa Riddle MRN: [id number] Date of Birth: 1928/05/27  Date of referral:  07/11/17               Reason for consult:  Facility Placement                Permission sought to share information with:  Case Manager, Family Supports Permission granted to share information::  Yes, Verbal Permission Granted  Name::     Charity fundraiser::     Relationship::  daughter  Contact Information:     Housing/Transportation Living arrangements for the past 2 months:  [place], [place] of Information:  Patient, Adult Children Patient Interpreter Needed:  None Criminal Activity/Legal Involvement Pertinent to Current Situation/Hospitalization:  No - Comment as needed Significant Relationships:  Adult Children, Spouse Lives with:  Spouse Do you feel safe going back to the place where you live?  Yes Need for family participation in patient care:  Yes (Comment)  Care giving concerns:  Patient reports she lives at home in [place] with her spouse that is 86. Spouse is limited in ability to help patient.    Social Worker assessment / plan:  LCSW consulted for SNF placement.   Patient admitted for acute colitis.   Patient is from [place], where she lives with her spouse. She has been staying with her daughter, Arbie Cookey, in [place], after a rehab stay at [facility] in [place]. According to daughter patient was in rehab for 15 days and discharged due to no progress. Arbie Cookey appealed the decision and was denied. Patient stayed at rehab a total of 17 days.   Ideally patient reports that she would like to go home to [place] ith her husband and have [place]. Patient understands that that is not an option because her husband cannot help her. Patient's daughter is willing to take her to her home with [facility] in [place], where she can provide 24 hr supervision. LCSW discussed HH and SNF options with patient and daughter. Daughter inquired about  SNF in [place]. LCSW discussed the process and cost of transport if they cannot provide transportation.   Patient did not give LCSW permission to fax out to facilities,but gave permission to start insurance [place]. Patient has [employer]. Patient and daughter expressed understanding that insurance co may not approve due to lack of progress during recent SNF stay. [place] office is not open on the weekends.   PLAN: TBD  Employment status:  Retired Nurse, adult PT Recommendations:  Home with [place] / Referral to community resources:  [place]  Patient/Family's Response to care:  Patient and family thankful for [employer].   Patient/Family's Understanding of and Emotional Response to Diagnosis, Current Treatment, and Prognosis:  Patient reports that she prefers to be at home with her spouse. Patient has poor insight into the level of care that she needs. Daughter, Arbie Cookey is realistic in the care that her mother needs. Arbie Cookey and patients spouse are in the process of talking to patient about SNF for LTC.   Emotional Assessment Appearance:  Appears stated age Attitude/Demeanor/Rapport:    Affect (typically observed):  Calm, Accepting Orientation:  Oriented to Self, Oriented to Place, Oriented to  Time, Oriented to Situation Alcohol / Substance use:  Not Applicable Psych involvement (Current and /or in the community):  No (Comment)  Discharge Needs  Concerns to be addressed:  Discharge Planning Concerns Readmission  within the last 30 days:  No Current discharge risk:  None Barriers to Discharge:  Continued Medical Work up, No SNF bed   Servando Snare, LCSW 07/11/2017, 1:30 PM

## 2017-07-11 NOTE — Progress Notes (Addendum)
PROGRESS NOTE    Melissa Riddle  YCX:[id number] DOB: [birth year] DOA: 07/10/2017 PCP: Patient, No Pcp Per   Brief Narrative:  HPI On 07/10/2017 by Dr. Brigid Re Melissa Riddle is a 84 y.o. female with medical history significant of COPD on 2.5 L nasal cannula, chronic diarrhea of unclear etiology on cholestyramine, hyperlipidemia who comes in with 1 week of nausea, vomiting, 1 year of diarrhea and failure to thrive.  On discussion with the daughter patient has had approximately 1 year of dominantly diarrhea sometimes alternating with constipation.  She was pending an outpatient GI evaluation however she was never medically stable enough to receive it.  She apparently was hospitalized in March 17 to March 29 at outside hospital in [place] with a "blockage" and then discharged for approximately 2 weeks to rehab.  Since then her daughter reports that the patient has progressively gotten weaker.  She could normally get around with a walker but is now been unable to get out of bed.  The patient apparently used to live with her husband of 72 years however her daughter recently brought her to [facility] approximately 1 week ago when she did not feel like the patient was safe at home.  The patient can barely perform most ADLs including toileting, transfer, making her own food.  Since the week that her daughter took her home she has had 3 episodes of nonbilious nonbloody emesis.  These are usually in the setting of eating food.  She has not had any abdominal pain and adamantly denies this.  She has had significantly decreased p.o. intake for the past week as well.  She denies any fevers, cough, congestion, shortness of breath, orthopnea, lower extremity edema, paroxysmal nocturnal dyspnea, rash.  Assessment & Plan   Sepsis secondary to acute colitis vs ?UTI -Upon admission, patient noted to be tachycardic, tachypneic with leukocytosis -Patient does have a history of chronic diarrhea.  Also complaining of  some nausea and vomiting on admission. -CT abdomen pelvis showed extensive sigmoid diverticulosis with mild sigmoid colon wall thickening and surrounding inflammatory changes suspicious for mild diverticulitis -Continue IV fluids, Unasyn -C. difficile PCR negative, GI pathogen panel pending -UA large leukocytes, many bacteria, 21-50 WBC -Blood and urine cultures pending -Continue Unasyn  Failure to thrive -Continues to have anorexia, weight loss, weakness -Unknown etiology -Nutrition consulted and appreciated -PT consulted  Hypoalbuminemia -Secondary to poor oral intake -Treatment plan as above  Chronic diarrhea -Unclear etiology -Patient is supposed to follow-up with gastroenterology as an outpatient -Continue cholestyramine -Stool studies as above  Hypokalemia -Possibly secondary to poor oral intake versus chronic diarrhea -Potassium 3.2, will replace and continue to monitor BMP  Hyperlipidemia -Continue statin  COPD with chronic home oxygen use -Patient uses 2.5 L home oxygen continuously -Stable  Liver lesion -CTA noted 2 cm hypodense lesion in segment 5 of the liver -Nonemergent MRI or ultrasound recommended for further evaluation  Abdominal aortic aneurysms -Patient noted to have to falciform infrarenal abdominal aortic aneurysms measuring up to 4.5 cm on CTA abdomen pelvis -Repeat CTA in 6 months and vascular consultation  DVT Prophylaxis  lovenox  Code Status: Full  Family Communication: None at bedside  Disposition Plan: Admitted. Pending improvement and work up.  Consultants None  Procedures  None  Antibiotics   Anti-infectives (From admission, onward)   Start     Dose/Rate Route Frequency Ordered Stop   07/10/17 1800  ampicillin-sulbactam (UNASYN) 1.5 g in sodium chloride 0.9 % 100 mL IVPB  1.5 g 200 mL/hr over 30 Minutes Intravenous Every 6 hours 07/10/17 1744     07/10/17 1345  levofloxacin (LEVAQUIN) IVPB 500 mg     500 mg 100 mL/hr  over 60 Minutes Intravenous  Once 07/10/17 1331 07/10/17 1512   07/10/17 1345  metroNIDAZOLE (FLAGYL) IVPB 500 mg     500 mg 100 mL/hr over 60 Minutes Intravenous  Once 07/10/17 1331 07/10/17 1639      Subjective:   Ar[place]a Jansky seen and examined today.  Continues to feel weak and tired.  Denies any current chest pain, shortness of breath, abdominal pain.  No longer having nausea or vomiting.  Has chronic diarrhea.  Denies current headache or dizziness.  Objective:   Vitals:   07/10/17 1745 07/10/17 1749 07/10/17 2030 07/11/17 0612  BP: (!) 112/56  (!) 104/55 (!) 100/53  Pulse: (!) 102  (!) 102 (!) 101  Resp: 20  18 (!) 25  Temp: 97.7 F (36.5 C)  97.9 F (36.6 C) 97.8 F (36.6 C)  TempSrc: Oral  Oral Oral  SpO2: (!) 89% 100% 100% 100%  Weight:      Height:       No intake or output data in the 24 hours ending 07/11/17 1025 Filed Weights   07/10/17 0957  Weight: 53.5 kg (118 lb)    Exam  General: Well developed, thin, elderly, chronically ill-appearing  HEENT: NCAT, mucous membranes dry, poor dentition  Neck: Supple  Cardiovascular: S1 S2 auscultated, no rubs, murmurs or gallops.  Tachycardic  Respiratory: Diminished breath sounds however clear.  No wheezing or rhonchi.  Abdomen: Soft, nontender, nondistended, + bowel sounds  Extremities: warm dry without cyanosis clubbing. +2LE edema  Neuro: AAOx3, nonfocal  Skin: Without rashes exudates or nodules. Multiple skin tears  Psych: depressed, however appropriate   Data Reviewed: I have personally reviewed following labs and imaging studies  CBC: Recent Labs  Lab 07/10/17 1110 07/11/17 0521  WBC 13.9* 10.5  NEUTROABS 11.9  --   HGB 11.2* 9.2*  HCT 35.0* 28.9*  MCV 89.3 90.0  PLT 514* 734*   Basic Metabolic Panel: Recent Labs  Lab 07/10/17 1110 07/11/17 0521  NA 137 137  K 3.7 3.2*  CL 100* 104  CO2 24 23  GLUCOSE 69 75  BUN 15 12  CREATININE 0.69 0.64  CALCIUM 8.2* 7.5*  MG 2.0  --     PHOS 2.3*  --    GFR: Estimated Creatinine Clearance: 41.8 mL/min (by C-G formula based on SCr of 0.64 mg/dL). Liver Function Tests: Recent Labs  Lab 07/10/17 1110  AST 15  ALT 16  ALKPHOS 79  BILITOT 1.1  PROT 5.1*  ALBUMIN 1.5*   Recent Labs  Lab 07/10/17 1110  LIPASE 25   No results for input(s): AMMONIA in the last 168 hours. Coagulation Profile: Recent Labs  Lab 07/10/17 1110  INR 1.00   Cardiac Enzymes: No results for input(s): CKTOTAL, CKMB, CKMBINDEX, TROPONINI in the last 168 hours. BNP (last 3 results) No results for input(s): PROBNP in the last 8760 hours. HbA1C: No results for input(s): HGBA1C in the last 72 hours. CBG: Recent Labs  Lab 07/10/17 1209  GLUCAP 53*   Lipid Profile: No results for input(s): CHOL, HDL, LDLCALC, TRIG, CHOLHDL, LDLDIRECT in the last 72 hours. Thyroid Function Tests: No results for input(s): TSH, T4TOTAL, FREET4, T3FREE, THYROIDAB in the last 72 hours. Anemia Panel: No results for input(s): VITAMINB12, FOLATE, FERRITIN, TIBC, IRON, RETICCTPCT in the last 72  hours. Urine analysis:    Component Value Date/Time   COLORURINE YELLOW 07/10/2017 2115   APPEARANCEUR HAZY (A) 07/10/2017 2115   LABSPEC >1.046 (H) 07/10/2017 2115   PHURINE 5.0 07/10/2017 2115   GLUCOSEU NEGATIVE 07/10/2017 2115   HGBUR MODERATE (A) 07/10/2017 2115   BILIRUBINUR NEGATIVE 07/10/2017 2115   KETONESUR 80 (A) 07/10/2017 2115   PROTEINUR NEGATIVE 07/10/2017 2115   NITRITE NEGATIVE 07/10/2017 2115   LEUKOCYTESUR LARGE (A) 07/10/2017 2115   Sepsis Labs: @LABRCNTIP (procalcitonin:4,lacticidven:4)  ) Recent Results (from the past 240 hour(s))  C difficile quick scan w PCR reflex     Status: None   Collection Time: 07/10/17  9:54 PM  Result Value Ref Range Status   C Diff antigen NEGATIVE NEGATIVE Final   C Diff toxin NEGATIVE NEGATIVE Final   C Diff interpretation No C. difficile detected.  Final    Comment: Performed at [facility], [place] [address] [place],  [address]      Radiology Studies: Ct Chest W Contrast  Result Date: 07/10/2017 CLINICAL DATA:  Weakness, shortness of breath, and weight loss. History of breast cancer. EXAM: CT CHEST, ABDOMEN, AND PELVIS WITH CONTRAST TECHNIQUE: Multidetector CT imaging of the chest, abdomen and pelvis was performed following the standard protocol during bolus administration of intravenous contrast. CONTRAST:  114mL ISOVUE-300 IOPAMIDOL (ISOVUE-300) INJECTION 61% COMPARISON:  None. FINDINGS: CT CHEST FINDINGS Cardiovascular: Normal heart size. No pericardial effusion. Normal caliber thoracic aorta. Coronary, aortic arch, and branch vessel atherosclerotic vascular disease. No pulmonary embolism. Mediastinum/Nodes: No enlarged mediastinal, hilar, or axillary lymph nodes. Prior left axillary lymph node dissection. Thyroid gland, trachea, and esophagus demonstrate no significant findings. Small spiculated, calcified lesion at the right cardiophrenic angle is of unclear etiology, possibly an old calcified lymph node, but likely of no clinical significance. Lungs/Pleura: Small left greater than right pleural effusions. Severe upper lobe predominant centrilobular emphysema. Mild lower lobe predominant peribronchial thickening. Biapical pleuroparenchymal scarring. Scattered minimally increased subpleural reticulation and peribronchovascular nodularity involving both lungs, sparing the right middle lobe. No consolidation or pneumothorax. No suspicious pulmonary nodule. Focal area of bronchiectasis in the left upper lobe is likely postinfectious or postinflammatory. Musculoskeletal: Prior left mastectomy. No acute or significant osseous findings. CT ABDOMEN PELVIS FINDINGS Hepatobiliary: 2.0 cm hypodense lesion in segment 5 of the liver. Status post cholecystectomy. No biliary dilatation. Pancreas: Moderate pancreatic atrophy. No ductal dilatation or surrounding inflammatory changes. Spleen:  Normal in size. Multiple small calcifications, consistent with prior granulomatous disease. Adrenals/Urinary Tract: The adrenal glands are unremarkable. Moderate bilateral renal cortical atrophy. No focal renal lesion. No renal or ureteral calculi. No hydronephrosis. The bladder is unremarkable. Stomach/Bowel: Stomach is within normal limits. Appendix is not definitively visualized, however there are no secondary signs of inflammation in the right lower quadrant. 2.7 cm duodenal diverticulum arising from the second portion. No bowel obstruction. Extensive sigmoid diverticulosis. Mild wall thickening of the sigmoid colon with mild surrounding fat stranding. Moderate stool throughout the colon. Vascular/Lymphatic: There are two fusiform infrarenal abdominal aortic aneurysms. The more superior aneurysm measures 4.5 cm in maximum dimension, while the more inferior aneurysm measures up to 3.3 cm in maximum dimension. Extensive aortoiliac atherosclerotic vascular calcifications. Moderate to severe stenosis of the right renal artery origin. No enlarged abdominal or pelvic lymph nodes. Reproductive: Uterus and bilateral adnexa are unremarkable for the patient's age. Other: No abdominal wall hernia or pneumoperitoneum. No fluid collection. Musculoskeletal: No acute or significant osseous findings. Old L4 compression deformity status post  cement augmentation. IMPRESSION: Chest: 1.  No acute intrathoracic process. 2. Small left greater than right pleural effusions. 3. COPD and chronic small airways disease.  Emphysema (ICD10-J43.9). 4.  Aortic atherosclerosis (ICD10-I70.0). Abdomen and pelvis: 1. Extensive sigmoid diverticulosis with mild sigmoid colon wall thickening and surrounding inflammatory changes, suspicious for mild sigmoid diverticulitis. No evidence of perforation or drainable fluid collection. 2. Indeterminate 2.0 cm hypodense lesion in segment 5 of the liver. Recommend further evaluation with ultrasound or  nonemergent MRI of the liver with and without contrast (if the patient can hold her breath appropriately). 3. Two fusiform infrarenal abdominal aortic aneurysms measuring up to 4.5 cm in maximal dimension. Recommend followup by abdomen and pelvis CTA in 6 months, and vascular surgery referral/consultation if not already obtained. This recommendation follows ACR consensus guidelines: White Paper of the ACR Incidental Findings Committee II on Vascular Findings. J Am Coll Radiol 2013; 10:789-794. Electronically Signed   By: Titus Dubin M.D.   On: 07/10/2017 13:15   Ct Abdomen Pelvis W Contrast  Result Date: 07/10/2017 CLINICAL DATA:  Weakness, shortness of breath, and weight loss. History of breast cancer. EXAM: CT CHEST, ABDOMEN, AND PELVIS WITH CONTRAST TECHNIQUE: Multidetector CT imaging of the chest, abdomen and pelvis was performed following the standard protocol during bolus administration of intravenous contrast. CONTRAST:  1110mL ISOVUE-300 IOPAMIDOL (ISOVUE-300) INJECTION 61% COMPARISON:  None. FINDINGS: CT CHEST FINDINGS Cardiovascular: Normal heart size. No pericardial effusion. Normal caliber thoracic aorta. Coronary, aortic arch, and branch vessel atherosclerotic vascular disease. No pulmonary embolism. Mediastinum/Nodes: No enlarged mediastinal, hilar, or axillary lymph nodes. Prior left axillary lymph node dissection. Thyroid gland, trachea, and esophagus demonstrate no significant findings. Small spiculated, calcified lesion at the right cardiophrenic angle is of unclear etiology, possibly an old calcified lymph node, but likely of no clinical significance. Lungs/Pleura: Small left greater than right pleural effusions. Severe upper lobe predominant centrilobular emphysema. Mild lower lobe predominant peribronchial thickening. Biapical pleuroparenchymal scarring. Scattered minimally increased subpleural reticulation and peribronchovascular nodularity involving both lungs, sparing the right middle  lobe. No consolidation or pneumothorax. No suspicious pulmonary nodule. Focal area of bronchiectasis in the left upper lobe is likely postinfectious or postinflammatory. Musculoskeletal: Prior left mastectomy. No acute or significant osseous findings. CT ABDOMEN PELVIS FINDINGS Hepatobiliary: 2.0 cm hypodense lesion in segment 5 of the liver. Status post cholecystectomy. No biliary dilatation. Pancreas: Moderate pancreatic atrophy. No ductal dilatation or surrounding inflammatory changes. Spleen: Normal in size. Multiple small calcifications, consistent with prior granulomatous disease. Adrenals/Urinary Tract: The adrenal glands are unremarkable. Moderate bilateral renal cortical atrophy. No focal renal lesion. No renal or ureteral calculi. No hydronephrosis. The bladder is unremarkable. Stomach/Bowel: Stomach is within normal limits. Appendix is not definitively visualized, however there are no secondary signs of inflammation in the right lower quadrant. 2.7 cm duodenal diverticulum arising from the second portion. No bowel obstruction. Extensive sigmoid diverticulosis. Mild wall thickening of the sigmoid colon with mild surrounding fat stranding. Moderate stool throughout the colon. Vascular/Lymphatic: There are two fusiform infrarenal abdominal aortic aneurysms. The more superior aneurysm measures 4.5 cm in maximum dimension, while the more inferior aneurysm measures up to 3.3 cm in maximum dimension. Extensive aortoiliac atherosclerotic vascular calcifications. Moderate to severe stenosis of the right renal artery origin. No enlarged abdominal or pelvic lymph nodes. Reproductive: Uterus and bilateral adnexa are unremarkable for the patient's age. Other: No abdominal wall hernia or pneumoperitoneum. No fluid collection. Musculoskeletal: No acute or significant osseous findings. Old L4 compression deformity status post cement  augmentation. IMPRESSION: Chest: 1.  No acute intrathoracic process. 2. Small left  greater than right pleural effusions. 3. COPD and chronic small airways disease.  Emphysema (ICD10-J43.9). 4.  Aortic atherosclerosis (ICD10-I70.0). Abdomen and pelvis: 1. Extensive sigmoid diverticulosis with mild sigmoid colon wall thickening and surrounding inflammatory changes, suspicious for mild sigmoid diverticulitis. No evidence of perforation or drainable fluid collection. 2. Indeterminate 2.0 cm hypodense lesion in segment 5 of the liver. Recommend further evaluation with ultrasound or nonemergent MRI of the liver with and without contrast (if the patient can hold her breath appropriately). 3. Two fusiform infrarenal abdominal aortic aneurysms measuring up to 4.5 cm in maximal dimension. Recommend followup by abdomen and pelvis CTA in 6 months, and vascular surgery referral/consultation if not already obtained. This recommendation follows ACR consensus guidelines: White Paper of the ACR Incidental Findings Committee II on Vascular Findings. J Am Coll Radiol 2013; 10:789-794. Electronically Signed   By: Titus Dubin M.D.   On: 07/10/2017 13:15     Scheduled Meds: . cholestyramine  4 g Oral BID  . enoxaparin (LOVENOX) injection  40 mg Subcutaneous Q24H  . mirtazapine  7.5 mg Oral Q24H  . pravastatin  40 mg Oral q1800   Continuous Infusions: . sodium chloride 50 mL/hr at 07/11/17 0740  . ampicillin-sulbactam (UNASYN) IV Stopped (07/11/17 0735)     LOS: 1 day   Time Spent in minutes   45 minutes  Teryn Gust D.O. on 07/11/2017 at 10:25 AM  Between 7am to 7pm - Pager - [phone]  After 7pm go to [email or web address] - password [email or web address]  And look for the night coverage person covering for me after hours  [facility] Office  [phone]

## 2017-07-12 LAB — URINE CULTURE

## 2017-07-12 LAB — BASIC METABOLIC PANEL
ANION GAP: 9 (ref 5–15)
BUN: 9 mg/dL (ref 6–20)
CALCIUM: 7.3 mg/dL — AB (ref 8.9–10.3)
CO2: 22 mmol/L (ref 22–32)
Chloride: 108 mmol/L (ref 101–111)
Creatinine, Ser: 0.49 mg/dL (ref 0.44–1.00)
GFR calc Af Amer: >60 mL/min (ref >60)
GFR calc non Af Amer: >60 mL/min (ref >60)
GLUCOSE: 74 mg/dL (ref 65–99)
POTASSIUM: 3.2 mmol/L — AB (ref 3.5–5.1)
Sodium: 139 mmol/L (ref 135–145)

## 2017-07-12 LAB — CBC
HEMATOCRIT: 28.7 % — AB (ref 36.0–46.0)
Hemoglobin: 9.1 g/dL — ABNORMAL LOW (ref 12.0–15.0)
MCH: 28.7 pg (ref 26.0–34.0)
MCHC: 31.7 g/dL (ref 30.0–36.0)
MCV: 90.5 fL (ref 78.0–100.0)
Platelets: 374 10*3/uL (ref 150–400)
RBC: 3.17 MIL/uL — ABNORMAL LOW (ref 3.87–5.11)
RDW: 18.7 % — AB (ref 11.5–15.5)
WBC: 9.2 10*3/uL (ref 4.0–10.5)

## 2017-07-12 LAB — MAGNESIUM: Magnesium: 2.2 mg/dL (ref 1.7–2.4)

## 2017-07-12 MED ORDER — BENEPROTEIN PO POWD
1.0000 | Freq: Three times a day (TID) | ORAL | Status: DC
  Filled 2017-07-12: qty 227

## 2017-07-12 MED ORDER — RESOURCE INSTANT PROTEIN PO PWD PACKET
1.0000 | Freq: Three times a day (TID) | ORAL | Status: DC
  Administered 2017-07-12 – 2017-07-14 (×3): 6 g via ORAL
  Filled 2017-07-12 (×10): qty 6

## 2017-07-12 MED ORDER — COLESTIPOL HCL 1 G PO TABS
2.0000 g | ORAL_TABLET | Freq: Two times a day (BID) | ORAL | Status: DC
  Administered 2017-07-12 – 2017-07-15 (×4): 2 g via ORAL
  Filled 2017-07-12 (×8): qty 2

## 2017-07-12 MED ORDER — UNJURY CHICKEN SOUP POWDER
8.0000 [oz_av] | Freq: Every day | ORAL | Status: DC
  Administered 2017-07-12 – 2017-07-14 (×2): 8 [oz_av] via ORAL
  Filled 2017-07-12 (×4): qty 27

## 2017-07-12 MED ORDER — BENZONATATE 100 MG PO CAPS
100.0000 mg | ORAL_CAPSULE | Freq: Three times a day (TID) | ORAL | Status: DC
  Administered 2017-07-12 – 2017-07-15 (×9): 100 mg via ORAL
  Filled 2017-07-12 (×11): qty 1

## 2017-07-12 MED ORDER — POTASSIUM CHLORIDE CRYS ER 10 MEQ PO TBCR
40.0000 meq | EXTENDED_RELEASE_TABLET | Freq: Once | ORAL | Status: AC
  Administered 2017-07-12: 40 meq via ORAL
  Filled 2017-07-12: qty 4

## 2017-07-12 NOTE — Progress Notes (Signed)
Initial Nutrition Assessment  DOCUMENTATION CODES:   Non-severe (moderate) malnutrition in context of chronic illness  INTERVENTION:   -Provide Beneprotein powder TID with meals, each provides 25 kcal and 6g protein -Provide Unjury chicken Soup daily, Each serving provides 100kcal and 21g protein   NUTRITION DIAGNOSIS:   Moderate Malnutrition related to chronic illness, diarrhea as evidenced by energy intake < or equal to 75% for > or equal to 1 month, moderate fat depletion, moderate muscle depletion.  GOAL:   Patient will meet greater than or equal to 90% of their needs  MONITOR:   PO intake, Supplement acceptance, Labs, Weight trends, I & O's  REASON FOR ASSESSMENT:   Consult Assessment of nutrition requirement/status  ASSESSMENT:   84 y.o. female with medical history significant of COPD on 2.5 L nasal cannula, chronic diarrhea of unclear etiology on cholestyramine, hyperlipidemia who comes in with 1 week of nausea, vomiting, 1 year of diarrhea and failure to thrive.  On discussion with the daughter patient has had approximately 1 year of dominantly diarrhea sometimes alternating with constipation.  Patient in room with daughter at bedside. Pt and pt's daughter report improved appetite. Lunch was still at bedside, pt consumed <25% of potato salad. Pt did not eat anything for breakfast other than coffee and some chocolate milk. Pt states she tries to drink milk with her meals. Pt states she has had poor appetite for the past week but per report from pt's daughter, pt has struggled with appetite, diarrhea and some vomitiing for ~1 year now.  Pt does not like Ensure or Boost supplements. Pt's daughter was trying to find clear Premier Protein for pt to try at home. Pt is willing to try Beneprotein and Unjury chicken soup supplements. Will order.  No weight history in chart.   Medications: Remeron tablet daily Labs reviewed: Low K   NUTRITION - FOCUSED PHYSICAL EXAM:    Most  Recent Value  Orbital Region  No depletion  Upper Arm Region  Moderate depletion  Thoracic and Lumbar Region  Unable to assess  Buccal Region  No depletion  Temple Region  Mild depletion  Clavicle Bone Region  Moderate depletion  Clavicle and Acromion Bone Region  Moderate depletion  Scapular Bone Region  Unable to assess  Dorsal Hand  No depletion  Patellar Region  Unable to assess  Anterior Thigh Region  Unable to assess  Posterior Calf Region  Unable to assess  Edema (RD Assessment)  Moderate       Diet Order:   Diet Order           Diet regular Room service appropriate? Yes; Fluid consistency: Thin  Diet effective now          EDUCATION NEEDS:   Education needs have been addressed  Skin:  Skin Assessment: Reviewed RN Assessment  Last BM:  5/17  Height:   Ht Readings from Last 1 Encounters:  07/10/17 5\' 4"  (1.626 m)    Weight:   Wt Readings from Last 1 Encounters:  07/10/17 118 lb (53.5 kg)    Ideal Body Weight:  54.5 kg  BMI:  Body mass index is 20.25 kg/m.  Estimated Nutritional Needs:   Kcal:  1400-1600  Protein:  65-75g  Fluid:  1.6L/day   Clayton Bibles, MS, RD, LDN [place] Dietitian Pager: [phone] After Hours Pager: [phone]

## 2017-07-12 NOTE — Progress Notes (Signed)
PT Note Clarification on DC recommendations per RN request: If family can provide 12* assist, then HHPT recommended. If not, then SNF recommended. Please see Clinical Impression in PT Evaluation from 07/11/17.   Blondell Reveal Kistler PT 07/12/2017  [phone]

## 2017-07-12 NOTE — Progress Notes (Signed)
PROGRESS NOTE    Melissa Riddle  MLJ:[id number] DOB: 25-Sep-1928 DOA: 07/10/2017 PCP: Patient, No Pcp Per   Brief Narrative:  HPI On 07/10/2017 by Dr. Brigid Re Purohit Melissa Riddle is a 84 y.o. female with medical history significant of COPD on 2.5 L nasal cannula, chronic diarrhea of unclear etiology on cholestyramine, hyperlipidemia who comes in with 1 week of nausea, vomiting, 1 year of diarrhea and failure to thrive.  On discussion with the daughter patient has had approximately 1 year of dominantly diarrhea sometimes alternating with constipation.  She was pending an outpatient GI evaluation however she was never medically stable enough to receive it.  She apparently was hospitalized in March 17 to March 29 at outside hospital in [place] with a "blockage" and then discharged for approximately 2 weeks to rehab.  Since then her daughter reports that the patient has progressively gotten weaker.  She could normally get around with a walker but is now been unable to get out of bed.  The patient apparently used to live with her husband of 10 years however her daughter recently brought her to [facility] approximately 1 week ago when she did not feel like the patient was safe at home.  The patient can barely perform most ADLs including toileting, transfer, making her own food.  Since the week that her daughter took her home she has had 3 episodes of nonbilious nonbloody emesis.  These are usually in the setting of eating food.  She has not had any abdominal pain and adamantly denies this.  She has had significantly decreased p.o. intake for the past week as well.  She denies any fevers, cough, congestion, shortness of breath, orthopnea, lower extremity edema, paroxysmal nocturnal dyspnea, rash.  Interim history Admitted for sepsis for colitis and UTI. Currently on antibiotics. Needs SNF  Assessment & Plan   Sepsis secondary to acute colitis vs ?UTI -Upon admission, patient noted to be tachycardic,  tachypneic with leukocytosis -Patient does have a history of chronic diarrhea.  Also complaining of some nausea and vomiting on admission. -CT abdomen pelvis showed extensive sigmoid diverticulosis with mild sigmoid colon wall thickening and surrounding inflammatory changes suspicious for mild diverticulitis -Continue IV fluids, Unasyn -C. difficile PCR negative, GI pathogen panel pending -UA large leukocytes, many bacteria, 21-50 WBC -Blood cultures show no growth to date -Urine culture show multiple species -Continue Unasyn  Failure to thrive/Deconditioning -Continues to have anorexia, weight loss, weakness -Unknown etiology -Nutrition consulted and appreciated -PT consulted and recommended Home health, however patient appears to need max assistance with standing and unable to take steps.  -Patient truly needs SNF placement -Appetite seems to be improving  Hypoalbuminemia -Secondary to poor oral intake -Treatment plan as above  Chronic diarrhea -Unclear etiology -Patient is supposed to follow-up with gastroenterology as an outpatient -Continue cholestyramine -Stool studies as above  Hypokalemia -Possibly secondary to poor oral intake versus chronic diarrhea -Despite replacement, potassium still 3.2.  Will obtain magnesium level today as well as continue to replace potassium -Continue to monitor BMP  Hyperlipidemia -Continue statin  COPD with chronic home oxygen use -Patient uses 2.5 L home oxygen continuously -Stable  Cough -Possibly secondary to COPD -Will obtain chest x-ray today -Order antitussives  Liver lesion -CTA noted 2 cm hypodense lesion in segment 5 of the liver -Nonemergent MRI or ultrasound recommended for further evaluation  Abdominal aortic aneurysms -Patient noted to have to falciform infrarenal abdominal aortic aneurysms measuring up to 4.5 cm on CTA abdomen pelvis -Repeat CTA in  6 months and vascular consultation  DVT Prophylaxis   lovenox  Code Status: Full  Family Communication: Daughter at bedside  Disposition Plan: Admitted. Pending improvement and work up.  Dispo TBD  Consultants None  Procedures  None  Antibiotics   Anti-infectives (From admission, onward)   Start     Dose/Rate Route Frequency Ordered Stop   07/10/17 1800  ampicillin-sulbactam (UNASYN) 1.5 g in sodium chloride 0.9 % 100 mL IVPB     1.5 g 200 mL/hr over 30 Minutes Intravenous Every 6 hours 07/10/17 1744     07/10/17 1345  levofloxacin (LEVAQUIN) IVPB 500 mg     500 mg 100 mL/hr over 60 Minutes Intravenous  Once 07/10/17 1331 07/10/17 1512   07/10/17 1345  metroNIDAZOLE (FLAGYL) IVPB 500 mg     500 mg 100 mL/hr over 60 Minutes Intravenous  Once 07/10/17 1331 07/10/17 1639      Subjective:   Arcola Jansky seen and examined today.  Continues to feel weak and tired.  Although states that she was able to eat more yesterday and this morning.  Does complain of cough.  Denies current chest pain, shortness of breath, abdominal pain, nausea or vomiting, dizziness or headache.  Objective:   Vitals:   07/11/17 0612 07/11/17 1400 07/11/17 2232 07/12/17 0543  BP: (!) 100/53 (!) 100/49 (!) 121/51 (!) 115/53  Pulse: (!) 101 97 97 92  Resp: (!) 25 (!) 24 20 19   Temp: 97.8 F (36.6 C) 97.9 F (36.6 C) 97.7 F (36.5 C) 98.2 F (36.8 C)  TempSrc: Oral Oral Oral Oral  SpO2: 100% 100% 95% 100%  Weight:      Height:        Intake/Output Summary (Last 24 hours) at 07/12/2017 1207 Last data filed at 07/12/2017 8657 Gross per 24 hour  Intake 1631.67 ml  Output 2 ml  Net 1629.67 ml   Filed Weights   07/10/17 0957  Weight: 53.5 kg (118 lb)   Exam  General: Well developed, chronically ill appearing, thin, elderly, NAD  HEENT: NCAT, mucous membranes moist.   Neck: Supple   Cardiovascular: S1 S2 auscultated, no rubs, murmurs or gallops. Regular rate and rhythm.  Respiratory: Diminished breath sounds, wheezing or rhonchi.   Occasional dry cough.  Abdomen: Soft, nontender, nondistended, + bowel sounds  Extremities: warm dry without cyanosis clubbing. +2 LE edema  Neuro: AAOx3, nonfocal  Psych: appropriate mood and affect  Data Reviewed: I have personally reviewed following labs and imaging studies  CBC: Recent Labs  Lab 07/10/17 1110 07/11/17 0521 07/12/17 0537  WBC 13.9* 10.5 9.2  NEUTROABS 11.9  --   --   HGB 11.2* 9.2* 9.1*  HCT 35.0* 28.9* 28.7*  MCV 89.3 90.0 90.5  PLT 514* 402* 846   Basic Metabolic Panel: Recent Labs  Lab 07/10/17 1110 07/11/17 0521 07/12/17 0537  NA 137 137 139  K 3.7 3.2* 3.2*  CL 100* 104 108  CO2 24 23 22   GLUCOSE 69 75 74  BUN 15 12 9   CREATININE 0.69 0.64 0.49  CALCIUM 8.2* 7.5* 7.3*  MG 2.0  --   --   PHOS 2.3*  --   --    GFR: Estimated Creatinine Clearance: 41.8 mL/min (by C-G formula based on SCr of 0.49 mg/dL). Liver Function Tests: Recent Labs  Lab 07/10/17 1110  AST 15  ALT 16  ALKPHOS 79  BILITOT 1.1  PROT 5.1*  ALBUMIN 1.5*   Recent Labs  Lab 07/10/17 1110  LIPASE 25   No results for input(s): AMMONIA in the last 168 hours. Coagulation Profile: Recent Labs  Lab 07/10/17 1110  INR 1.00   Cardiac Enzymes: No results for input(s): CKTOTAL, CKMB, CKMBINDEX, TROPONINI in the last 168 hours. BNP (last 3 results) No results for input(s): PROBNP in the last 8760 hours. HbA1C: No results for input(s): HGBA1C in the last 72 hours. CBG: Recent Labs  Lab 07/10/17 1209  GLUCAP 53*   Lipid Profile: No results for input(s): CHOL, HDL, LDLCALC, TRIG, CHOLHDL, LDLDIRECT in the last 72 hours. Thyroid Function Tests: No results for input(s): TSH, T4TOTAL, FREET4, T3FREE, THYROIDAB in the last 72 hours. Anemia Panel: No results for input(s): VITAMINB12, FOLATE, FERRITIN, TIBC, IRON, RETICCTPCT in the last 72 hours. Urine analysis:    Component Value Date/Time   COLORURINE YELLOW 07/10/2017 2115   APPEARANCEUR HAZY (A) 07/10/2017  2115   LABSPEC >1.046 (H) 07/10/2017 2115   PHURINE 5.0 07/10/2017 2115   GLUCOSEU NEGATIVE 07/10/2017 2115   HGBUR MODERATE (A) 07/10/2017 2115   BILIRUBINUR NEGATIVE 07/10/2017 2115   KETONESUR 80 (A) 07/10/2017 2115   PROTEINUR NEGATIVE 07/10/2017 2115   NITRITE NEGATIVE 07/10/2017 2115   LEUKOCYTESUR LARGE (A) 07/10/2017 2115   Sepsis Labs: @LABRCNTIP (procalcitonin:4,lacticidven:4)  ) Recent Results (from the past 240 hour(s))  Culture, blood (routine x 2)     Status: None (Preliminary result)   Collection Time: 07/10/17 11:10 AM  Result Value Ref Range Status   Specimen Description   Final    BLOOD RIGHT ANTECUBITAL Performed at [facility], [place] [address], [place], [place] [address]    Special Requests   Final    BOTTLES DRAWN AEROBIC AND ANAEROBIC Blood Culture adequate volume Performed at [facility] [address], [place], [place] [address]    Culture   Final    NO GROWTH 2 DAYS Performed at [facility] [address], [place], [place] [address]    Report Status PENDING  Incomplete  Gastrointestinal Panel by PCR , Stool     Status: None   Collection Time: 07/10/17  2:21 PM  Result Value Ref Range Status   Campylobacter species NOT DETECTED NOT DETECTED Final   Plesimonas shigelloides NOT DETECTED NOT DETECTED Final   Salmonella species NOT DETECTED NOT DETECTED Final   Yersinia enterocolitica NOT DETECTED NOT DETECTED Final   Vibrio species NOT DETECTED NOT DETECTED Final   Vibrio cholerae NOT DETECTED NOT DETECTED Final   Enteroaggregative E coli (EAEC) NOT DETECTED NOT DETECTED Final   Enteropathogenic E coli (EPEC) NOT DETECTED NOT DETECTED Final   Enterotoxigenic E coli (ETEC) NOT DETECTED NOT DETECTED Final   Shiga like toxin producing E coli (STEC) NOT DETECTED NOT DETECTED Final   Shigella/Enteroinvasive E coli (EIEC) NOT DETECTED NOT DETECTED Final   Cryptosporidium NOT DETECTED NOT DETECTED Final    Cyclospora cayetanensis NOT DETECTED NOT DETECTED Final   Entamoeba histolytica NOT DETECTED NOT DETECTED Final   Giardia lamblia NOT DETECTED NOT DETECTED Final   Adenovirus F40/41 NOT DETECTED NOT DETECTED Final   Astrovirus NOT DETECTED NOT DETECTED Final   Norovirus GI/GII NOT DETECTED NOT DETECTED Final   Rotavirus A NOT DETECTED NOT DETECTED Final   Sapovirus (I, II, IV, and V) NOT DETECTED NOT DETECTED Final    Comment: Performed at [facility], [address] [place], [place] [address]  Culture, blood (routine x 2)     Status: None (Preliminary result)  Collection Time: 07/10/17  5:40 PM  Result Value Ref Range Status   Specimen Description   Final    BLOOD RIGHT HAND Performed at [facility], [place] [address], [place], [place] [address]    Special Requests   Final    BOTTLES DRAWN AEROBIC ONLY Blood Culture results may not be optimal due to an inadequate volume of blood received in culture bottles Performed at [facility] [address] [place], [place] [address]    Culture   Final    NO GROWTH 2 DAYS Performed at [facility] [address], [place], [place] [address]    Report Status PENDING  Incomplete  Urine culture     Status: Abnormal   Collection Time: 07/10/17  9:15 PM  Result Value Ref Range Status   Specimen Description   Final    URINE, RANDOM Performed at [facility] [address], [place], [place] [address]    Special Requests   Final    NONE Performed at [facility], [place] [address], [place], [place] [address]    Culture MULTIPLE SPECIES PRESENT, SUGGEST RECOLLECTION (A)  Final   Report Status 07/12/2017 FINAL  Final  C difficile quick scan w PCR reflex     Status: None   Collection Time: 07/10/17  9:54 PM  Result Value Ref Range Status   C Diff antigen NEGATIVE NEGATIVE Final   C Diff toxin NEGATIVE NEGATIVE Final   C Diff interpretation No C.  difficile detected.  Final    Comment: Performed at [facility], [place] [address] [place], [place] [address]      Radiology Studies: Ct Chest W Contrast  Result Date: 07/10/2017 CLINICAL DATA:  Weakness, shortness of breath, and weight loss. History of breast cancer. EXAM: CT CHEST, ABDOMEN, AND PELVIS WITH CONTRAST TECHNIQUE: Multidetector CT imaging of the chest, abdomen and pelvis was performed following the standard protocol during bolus administration of intravenous contrast. CONTRAST:  135mL ISOVUE-300 IOPAMIDOL (ISOVUE-300) INJECTION 61% COMPARISON:  None. FINDINGS: CT CHEST FINDINGS Cardiovascular: Normal heart size. No pericardial effusion. Normal caliber thoracic aorta. Coronary, aortic arch, and branch vessel atherosclerotic vascular disease. No pulmonary embolism. Mediastinum/Nodes: No enlarged mediastinal, hilar, or axillary lymph nodes. Prior left axillary lymph node dissection. Thyroid gland, trachea, and esophagus demonstrate no significant findings. Small spiculated, calcified lesion at the right cardiophrenic angle is of unclear etiology, possibly an old calcified lymph node, but likely of no clinical significance. Lungs/Pleura: Small left greater than right pleural effusions. Severe upper lobe predominant centrilobular emphysema. Mild lower lobe predominant peribronchial thickening. Biapical pleuroparenchymal scarring. Scattered minimally increased subpleural reticulation and peribronchovascular nodularity involving both lungs, sparing the right middle lobe. No consolidation or pneumothorax. No suspicious pulmonary nodule. Focal area of bronchiectasis in the left upper lobe is likely postinfectious or postinflammatory. Musculoskeletal: Prior left mastectomy. No acute or significant osseous findings. CT ABDOMEN PELVIS FINDINGS Hepatobiliary: 2.0 cm hypodense lesion in segment 5 of the liver. Status post cholecystectomy. No biliary dilatation. Pancreas: Moderate pancreatic  atrophy. No ductal dilatation or surrounding inflammatory changes. Spleen: Normal in size. Multiple small calcifications, consistent with prior granulomatous disease. Adrenals/Urinary Tract: The adrenal glands are unremarkable. Moderate bilateral renal cortical atrophy. No focal renal lesion. No renal or ureteral calculi. No hydronephrosis. The bladder is unremarkable. Stomach/Bowel: Stomach is within normal limits. Appendix is not definitively visualized, however there are no secondary signs of inflammation in the right lower quadrant. 2.7 cm  duodenal diverticulum arising from the second portion. No bowel obstruction. Extensive sigmoid diverticulosis. Mild wall thickening of the sigmoid colon with mild surrounding fat stranding. Moderate stool throughout the colon. Vascular/Lymphatic: There are two fusiform infrarenal abdominal aortic aneurysms. The more superior aneurysm measures 4.5 cm in maximum dimension, while the more inferior aneurysm measures up to 3.3 cm in maximum dimension. Extensive aortoiliac atherosclerotic vascular calcifications. Moderate to severe stenosis of the right renal artery origin. No enlarged abdominal or pelvic lymph nodes. Reproductive: Uterus and bilateral adnexa are unremarkable for the patient's age. Other: No abdominal wall hernia or pneumoperitoneum. No fluid collection. Musculoskeletal: No acute or significant osseous findings. Old L4 compression deformity status post cement augmentation. IMPRESSION: Chest: 1.  No acute intrathoracic process. 2. Small left greater than right pleural effusions. 3. COPD and chronic small airways disease.  Emphysema (ICD10-J43.9). 4.  Aortic atherosclerosis (ICD10-I70.0). Abdomen and pelvis: 1. Extensive sigmoid diverticulosis with mild sigmoid colon wall thickening and surrounding inflammatory changes, suspicious for mild sigmoid diverticulitis. No evidence of perforation or drainable fluid collection. 2. Indeterminate 2.0 cm hypodense lesion in  segment 5 of the liver. Recommend further evaluation with ultrasound or nonemergent MRI of the liver with and without contrast (if the patient can hold her breath appropriately). 3. Two fusiform infrarenal abdominal aortic aneurysms measuring up to 4.5 cm in maximal dimension. Recommend followup by abdomen and pelvis CTA in 6 months, and vascular surgery referral/consultation if not already obtained. This recommendation follows ACR consensus guidelines: White Paper of the ACR Incidental Findings Committee II on Vascular Findings. J Am Coll Radiol 2013; 10:789-794. Electronically Signed   By: Titus Dubin M.D.   On: 07/10/2017 13:15   Ct Abdomen Pelvis W Contrast  Result Date: 07/10/2017 CLINICAL DATA:  Weakness, shortness of breath, and weight loss. History of breast cancer. EXAM: CT CHEST, ABDOMEN, AND PELVIS WITH CONTRAST TECHNIQUE: Multidetector CT imaging of the chest, abdomen and pelvis was performed following the standard protocol during bolus administration of intravenous contrast. CONTRAST:  131mL ISOVUE-300 IOPAMIDOL (ISOVUE-300) INJECTION 61% COMPARISON:  None. FINDINGS: CT CHEST FINDINGS Cardiovascular: Normal heart size. No pericardial effusion. Normal caliber thoracic aorta. Coronary, aortic arch, and branch vessel atherosclerotic vascular disease. No pulmonary embolism. Mediastinum/Nodes: No enlarged mediastinal, hilar, or axillary lymph nodes. Prior left axillary lymph node dissection. Thyroid gland, trachea, and esophagus demonstrate no significant findings. Small spiculated, calcified lesion at the right cardiophrenic angle is of unclear etiology, possibly an old calcified lymph node, but likely of no clinical significance. Lungs/Pleura: Small left greater than right pleural effusions. Severe upper lobe predominant centrilobular emphysema. Mild lower lobe predominant peribronchial thickening. Biapical pleuroparenchymal scarring. Scattered minimally increased subpleural reticulation and  peribronchovascular nodularity involving both lungs, sparing the right middle lobe. No consolidation or pneumothorax. No suspicious pulmonary nodule. Focal area of bronchiectasis in the left upper lobe is likely postinfectious or postinflammatory. Musculoskeletal: Prior left mastectomy. No acute or significant osseous findings. CT ABDOMEN PELVIS FINDINGS Hepatobiliary: 2.0 cm hypodense lesion in segment 5 of the liver. Status post cholecystectomy. No biliary dilatation. Pancreas: Moderate pancreatic atrophy. No ductal dilatation or surrounding inflammatory changes. Spleen: Normal in size. Multiple small calcifications, consistent with prior granulomatous disease. Adrenals/Urinary Tract: The adrenal glands are unremarkable. Moderate bilateral renal cortical atrophy. No focal renal lesion. No renal or ureteral calculi. No hydronephrosis. The bladder is unremarkable. Stomach/Bowel: Stomach is within normal limits. Appendix is not definitively visualized, however there are no secondary signs of inflammation in the right lower quadrant. 2.7 cm duodenal  diverticulum arising from the second portion. No bowel obstruction. Extensive sigmoid diverticulosis. Mild wall thickening of the sigmoid colon with mild surrounding fat stranding. Moderate stool throughout the colon. Vascular/Lymphatic: There are two fusiform infrarenal abdominal aortic aneurysms. The more superior aneurysm measures 4.5 cm in maximum dimension, while the more inferior aneurysm measures up to 3.3 cm in maximum dimension. Extensive aortoiliac atherosclerotic vascular calcifications. Moderate to severe stenosis of the right renal artery origin. No enlarged abdominal or pelvic lymph nodes. Reproductive: Uterus and bilateral adnexa are unremarkable for the patient's age. Other: No abdominal wall hernia or pneumoperitoneum. No fluid collection. Musculoskeletal: No acute or significant osseous findings. Old L4 compression deformity status post cement  augmentation. IMPRESSION: Chest: 1.  No acute intrathoracic process. 2. Small left greater than right pleural effusions. 3. COPD and chronic small airways disease.  Emphysema (ICD10-J43.9). 4.  Aortic atherosclerosis (ICD10-I70.0). Abdomen and pelvis: 1. Extensive sigmoid diverticulosis with mild sigmoid colon wall thickening and surrounding inflammatory changes, suspicious for mild sigmoid diverticulitis. No evidence of perforation or drainable fluid collection. 2. Indeterminate 2.0 cm hypodense lesion in segment 5 of the liver. Recommend further evaluation with ultrasound or nonemergent MRI of the liver with and without contrast (if the patient can hold her breath appropriately). 3. Two fusiform infrarenal abdominal aortic aneurysms measuring up to 4.5 cm in maximal dimension. Recommend followup by abdomen and pelvis CTA in 6 months, and vascular surgery referral/consultation if not already obtained. This recommendation follows ACR consensus guidelines: White Paper of the ACR Incidental Findings Committee II on Vascular Findings. J Am Coll Radiol 2013; 10:789-794. Electronically Signed   By: Titus Dubin M.D.   On: 07/10/2017 13:15     Scheduled Meds: . colestipol  2 g Oral BID  . enoxaparin (LOVENOX) injection  40 mg Subcutaneous Q24H  . mirtazapine  7.5 mg Oral Q24H  . potassium chloride  40 mEq Oral Once  . pravastatin  40 mg Oral q1800   Continuous Infusions: . ampicillin-sulbactam (UNASYN) IV Stopped (07/12/17 0643)     LOS: 2 days   Time Spent in minutes   45 minutes (greater than 50% of time spent with patient and daugther face to face, as well as reviewing imaging and labs, and formulating a plan)  Cristal Ford D.O. on 07/12/2017 at 12:07 PM  Between 7am to 7pm - Pager - [phone]  After 7pm go to [email or web address] - password [email or web address]  And look for the night coverage person covering for me after hours  [facility] Office  [phone]

## 2017-07-13 ENCOUNTER — Inpatient Hospital Stay (HOSPITAL_COMMUNITY): Payer: Medicare FFS

## 2017-07-13 DIAGNOSIS — E440 Moderate protein-calorie malnutrition: Secondary | ICD-10-CM

## 2017-07-13 LAB — BASIC METABOLIC PANEL
Anion gap: 6 (ref 5–15)
BUN: 11 mg/dL (ref 6–20)
CALCIUM: 7.5 mg/dL — AB (ref 8.9–10.3)
CO2: 24 mmol/L (ref 22–32)
CREATININE: 0.58 mg/dL (ref 0.44–1.00)
Chloride: 110 mmol/L (ref 101–111)
Glucose, Bld: 101 mg/dL — ABNORMAL HIGH (ref 65–99)
Potassium: 3.5 mmol/L (ref 3.5–5.1)
Sodium: 140 mmol/L (ref 135–145)

## 2017-07-13 LAB — CBC
HEMATOCRIT: 27.3 % — AB (ref 36.0–46.0)
Hemoglobin: 8.8 g/dL — ABNORMAL LOW (ref 12.0–15.0)
MCH: 29 pg (ref 26.0–34.0)
MCHC: 32.2 g/dL (ref 30.0–36.0)
MCV: 90.1 fL (ref 78.0–100.0)
PLATELETS: 398 10*3/uL (ref 150–400)
RBC: 3.03 MIL/uL — ABNORMAL LOW (ref 3.87–5.11)
RDW: 19.1 % — AB (ref 11.5–15.5)
WBC: 9.2 10*3/uL (ref 4.0–10.5)

## 2017-07-13 NOTE — Progress Notes (Addendum)
PROGRESS NOTE    Melissa Riddle  LOV:[id number] DOB: 07-09-1928 DOA: 07/10/2017 PCP: Patient, No Pcp Per   Brief Narrative:  HPI On 07/10/2017 by Dr. Brigid Re Purohit Sahian Melissa Riddle is a 84 y.o. female with medical history significant of COPD on 2.5 L nasal cannula, chronic diarrhea of unclear etiology on cholestyramine, hyperlipidemia who comes in with 1 week of nausea, vomiting, 1 year of diarrhea and failure to thrive.  On discussion with the daughter patient has had approximately 1 year of dominantly diarrhea sometimes alternating with constipation.  She was pending an outpatient GI evaluation however she was never medically stable enough to receive it.  She apparently was hospitalized in March 17 to March 29 at outside hospital in [place] with a "blockage" and then discharged for approximately 2 weeks to rehab.  Since then her daughter reports that the patient has progressively gotten weaker.  She could normally get around with a walker but is now been unable to get out of bed.  The patient apparently used to live with her husband of 25 years however her daughter recently brought her to [facility] approximately 1 week ago when she did not feel like the patient was safe at home.  The patient can barely perform most ADLs including toileting, transfer, making her own food.  Since the week that her daughter took her home she has had 3 episodes of nonbilious nonbloody emesis.  These are usually in the setting of eating food.  She has not had any abdominal pain and adamantly denies this.  She has had significantly decreased p.o. intake for the past week as well.  She denies any fevers, cough, congestion, shortness of breath, orthopnea, lower extremity edema, paroxysmal nocturnal dyspnea, rash.  Interim history Admitted for sepsis for colitis and UTI. Currently on antibiotics. Needs SNF  Assessment & Plan   Sepsis secondary to acute colitis vs ?UTI -Upon admission, patient noted to be tachycardic,  tachypneic with leukocytosis -Patient does have a history of chronic diarrhea.  Also complaining of some nausea and vomiting on admission. -CT abdomen pelvis showed extensive sigmoid diverticulosis with mild sigmoid colon wall thickening and surrounding inflammatory changes suspicious for mild diverticulitis -Continue IV fluids, Unasyn -C. difficile PCR and GI pathogen panel negative -UA large leukocytes, many bacteria, 21-50 WBC -Blood cultures show no growth to date -Urine culture show multiple species -Continue Unasyn  Failure to thrive/Deconditioning/moderate malnutrition -Continues to have anorexia, weight loss, weakness -Unknown etiology -Nutrition consulted and appreciated- recommended beneprotein poweder TID, unjury chicken soup daily -PT consulted and recommended Home health, however patient appears to need max assistance with standing and unable to take steps.  -Patient truly needs SNF placement -Appetite seems to be improving -social work consulted -Discussed with patient's daughter on 07/12/2017, feels if patient does need to be at a rehab facility as she cannot provide 24-hour care at this time.   Hypoalbuminemia -Secondary to poor oral intake -Treatment plan as above  Chronic diarrhea -Unclear etiology -Patient is supposed to follow-up with gastroenterology as an outpatient -Continue cholestyramine -Stool studies as above  Hypokalemia -Possibly secondary to poor oral intake versus chronic diarrhea -Despite replacement, potassium still 3.2.  Will obtain magnesium level today as well as continue to replace potassium -Continue to monitor BMP  Hyperlipidemia -Continue statin  COPD with chronic home oxygen use -Patient uses 2.5 L home oxygen continuously -Stable  Cough -Possibly secondary to COPD -Continue antitussives  -will obtain CXR  Liver lesion -CTA noted 2 cm hypodense lesion in segment  5 of the liver -Nonemergent MRI or ultrasound recommended for  further evaluation  Abdominal aortic aneurysms -Patient noted to have to falciform infrarenal abdominal aortic aneurysms measuring up to 4.5 cm on CTA abdomen pelvis -Repeat CTA in 6 months and vascular consultation  Normocytic anemia -reviewed epic, no prior labs to this admission for comparison -Hemoglobin currently 8.8 -will order anemia panel -Continue to monitor CBC closely  Hypocalcemia -Likely secondary to hypoalbuminemia -Corrected calcium within normal limits  DVT Prophylaxis  lovenox  Code Status: Full  Family Communication: None at bedside  Disposition Plan: Admitted. Pending SNF  Consultants None  Procedures  None  Antibiotics   Anti-infectives (From admission, onward)   Start     Dose/Rate Route Frequency Ordered Stop   07/10/17 1800  ampicillin-sulbactam (UNASYN) 1.5 g in sodium chloride 0.9 % 100 mL IVPB     1.5 g 200 mL/hr over 30 Minutes Intravenous Every 6 hours 07/10/17 1744     07/10/17 1345  levofloxacin (LEVAQUIN) IVPB 500 mg     500 mg 100 mL/hr over 60 Minutes Intravenous  Once 07/10/17 1331 07/10/17 1512   07/10/17 1345  metroNIDAZOLE (FLAGYL) IVPB 500 mg     500 mg 100 mL/hr over 60 Minutes Intravenous  Once 07/10/17 1331 07/10/17 1639      Subjective:   Arcola Jansky seen and examined today.  Denies current chest pain, shortness of breath, abdominal pain, nausea vomiting.  Has chronic diarrhea.  Continues to have mild cough.  Objective:   Vitals:   07/12/17 0543 07/12/17 1436 07/12/17 2047 07/13/17 0450  BP: (!) 115/53 (!) 106/56 (!) 101/49 (!) 117/58  Pulse: 92 95 99 (!) 101  Resp: 19 18 18 18   Temp: 98.2 F (36.8 C) 97.9 F (36.6 C) 97.8 F (36.6 C) 97.8 F (36.6 C)  TempSrc: Oral Oral Oral Oral  SpO2: 100% 100% 100% 100%  Weight:      Height:        Intake/Output Summary (Last 24 hours) at 07/13/2017 1333 Last data filed at 07/12/2017 1821 Gross per 24 hour  Intake -  Output 2 ml  Net -2 ml   Filed Weights    07/10/17 0957  Weight: 53.5 kg (118 lb)   Exam  General: Well developed, chronically ill-appearing, elderly, NAD  HEENT: NCAT, mucous membranes moist.   Neck: Supple  Cardiovascular: S1 S2 auscultated, no rubs, murmurs or gallops. Regular rate and rhythm.  Respiratory: Diminished breath sounds, mild expiratory wheezing  Abdomen: Soft, nontender, nondistended, + bowel sounds  Extremities: warm dry without cyanosis clubbing. 2+ LE edema  Neuro: AAOx3, nonfocal  Psych: appropriate  Data Reviewed: I have personally reviewed following labs and imaging studies  CBC: Recent Labs  Lab 07/10/17 1110 07/11/17 0521 07/12/17 0537 07/13/17 0552  WBC 13.9* 10.5 9.2 9.2  NEUTROABS 11.9  --   --   --   HGB 11.2* 9.2* 9.1* 8.8*  HCT 35.0* 28.9* 28.7* 27.3*  MCV 89.3 90.0 90.5 90.1  PLT 514* 402* 374 621   Basic Metabolic Panel: Recent Labs  Lab 07/10/17 1110 07/11/17 0521 07/12/17 0537 07/12/17 1225 07/13/17 0552  NA 137 137 139  --  140  K 3.7 3.2* 3.2*  --  3.5  CL 100* 104 108  --  110  CO2 24 23 22   --  24  GLUCOSE 69 75 74  --  101*  BUN 15 12 9   --  11  CREATININE 0.69 0.64 0.49  --  0.58  CALCIUM 8.2* 7.5* 7.3*  --  7.5*  MG 2.0  --   --  2.2  --   PHOS 2.3*  --   --   --   --    GFR: Estimated Creatinine Clearance: 41.8 mL/min (by C-G formula based on SCr of 0.58 mg/dL). Liver Function Tests: Recent Labs  Lab 07/10/17 1110  AST 15  ALT 16  ALKPHOS 79  BILITOT 1.1  PROT 5.1*  ALBUMIN 1.5*   Recent Labs  Lab 07/10/17 1110  LIPASE 25   No results for input(s): AMMONIA in the last 168 hours. Coagulation Profile: Recent Labs  Lab 07/10/17 1110  INR 1.00   Cardiac Enzymes: No results for input(s): CKTOTAL, CKMB, CKMBINDEX, TROPONINI in the last 168 hours. BNP (last 3 results) No results for input(s): PROBNP in the last 8760 hours. HbA1C: No results for input(s): HGBA1C in the last 72 hours. CBG: Recent Labs  Lab 07/10/17 1209  GLUCAP 53*     Lipid Profile: No results for input(s): CHOL, HDL, LDLCALC, TRIG, CHOLHDL, LDLDIRECT in the last 72 hours. Thyroid Function Tests: No results for input(s): TSH, T4TOTAL, FREET4, T3FREE, THYROIDAB in the last 72 hours. Anemia Panel: No results for input(s): VITAMINB12, FOLATE, FERRITIN, TIBC, IRON, RETICCTPCT in the last 72 hours. Urine analysis:    Component Value Date/Time   COLORURINE YELLOW 07/10/2017 2115   APPEARANCEUR HAZY (A) 07/10/2017 2115   LABSPEC >1.046 (H) 07/10/2017 2115   PHURINE 5.0 07/10/2017 2115   GLUCOSEU NEGATIVE 07/10/2017 2115   HGBUR MODERATE (A) 07/10/2017 2115   BILIRUBINUR NEGATIVE 07/10/2017 2115   KETONESUR 80 (A) 07/10/2017 2115   PROTEINUR NEGATIVE 07/10/2017 2115   NITRITE NEGATIVE 07/10/2017 2115   LEUKOCYTESUR LARGE (A) 07/10/2017 2115   Sepsis Labs: @LABRCNTIP (procalcitonin:4,lacticidven:4)  ) Recent Results (from the past 240 hour(s))  Culture, blood (routine x 2)     Status: None (Preliminary result)   Collection Time: 07/10/17 11:10 AM  Result Value Ref Range Status   Specimen Description   Final    BLOOD RIGHT ANTECUBITAL Performed at [facility], [place] [address], [place], [place] [address]    Special Requests   Final    BOTTLES DRAWN AEROBIC AND ANAEROBIC Blood Culture adequate volume Performed at [place] [address], [place], [place] [address]    Culture   Final    NO GROWTH 2 DAYS Performed at [place] [address] [place], [place] [address]    Report Status PENDING  Incomplete  Gastrointestinal Panel by PCR , Stool     Status: None   Collection Time: 07/10/17  2:21 PM  Result Value Ref Range Status   Campylobacter species NOT DETECTED NOT DETECTED Final   Plesimonas shigelloides NOT DETECTED NOT DETECTED Final   Salmonella species NOT DETECTED NOT DETECTED Final   Yersinia enterocolitica NOT DETECTED NOT DETECTED Final   Vibrio species NOT DETECTED NOT DETECTED  Final   Vibrio cholerae NOT DETECTED NOT DETECTED Final   Enteroaggregative E coli (EAEC) NOT DETECTED NOT DETECTED Final   Enteropathogenic E coli (EPEC) NOT DETECTED NOT DETECTED Final   Enterotoxigenic E coli (ETEC) NOT DETECTED NOT DETECTED Final   Shiga like toxin producing E coli (STEC) NOT DETECTED NOT DETECTED Final   Shigella/Enteroinvasive E coli (EIEC) NOT DETECTED NOT DETECTED Final   Cryptosporidium NOT DETECTED NOT DETECTED Final   Cyclospora cayetanensis NOT DETECTED NOT DETECTED Final   Entamoeba histolytica NOT DETECTED  NOT DETECTED Final   Giardia lamblia NOT DETECTED NOT DETECTED Final   Adenovirus F40/41 NOT DETECTED NOT DETECTED Final   Astrovirus NOT DETECTED NOT DETECTED Final   Norovirus GI/GII NOT DETECTED NOT DETECTED Final   Rotavirus A NOT DETECTED NOT DETECTED Final   Sapovirus (I, II, IV, and V) NOT DETECTED NOT DETECTED Final    Comment: Performed at [facility], [place] [place], [place] [address]  Culture, blood (routine x 2)     Status: None (Preliminary result)   Collection Time: 07/10/17  5:40 PM  Result Value Ref Range Status   Specimen Description   Final    BLOOD RIGHT HAND Performed at [facility], [place] [address] [place], [place] [address]    Special Requests   Final    BOTTLES DRAWN AEROBIC ONLY Blood Culture results may not be optimal due to an inadequate volume of blood received in culture bottles Performed at [place] [address], [place], [place] [address]    Culture   Final    NO GROWTH 2 DAYS Performed at [place] [address], [place], [place] [address]    Report Status PENDING  Incomplete  Urine culture     Status: Abnormal   Collection Time: 07/10/17  9:15 PM  Result Value Ref Range Status   Specimen Description   Final    URINE, RANDOM Performed at [place] [address] [place], [place] [address]    Special Requests    Final    NONE Performed at [facility], [place] [address], [place], [place] [address]    Culture MULTIPLE SPECIES PRESENT, SUGGEST RECOLLECTION (A)  Final   Report Status 07/12/2017 FINAL  Final  C difficile quick scan w PCR reflex     Status: None   Collection Time: 07/10/17  9:54 PM  Result Value Ref Range Status   C Diff antigen NEGATIVE NEGATIVE Final   C Diff toxin NEGATIVE NEGATIVE Final   C Diff interpretation No C. difficile detected.  Final    Comment: Performed at [facility], [place] [address], [place], [place] [address]      Radiology Studies: No results found.   Scheduled Meds: . benzonatate  100 mg Oral TID  . colestipol  2 g Oral BID  . enoxaparin (LOVENOX) injection  40 mg Subcutaneous Q24H  . mirtazapine  7.5 mg Oral Q24H  . pravastatin  40 mg Oral q1800  . protein supplement  1 scoop Oral TID WC  . protein supplement  8 oz Oral Q2000   Continuous Infusions: . ampicillin-sulbactam (UNASYN) IV Stopped (07/13/17 1207)     LOS: 3 days   Time Spent in minutes   30 minutes  Jais Demir D.O. on 07/13/2017 at 1:33 PM  Between 7am to 7pm - Pager - [phone]  After 7pm go to [email or web address] - password [email or web address]  And look for the night coverage person covering for me after hours  [facility] Office  [phone]

## 2017-07-13 NOTE — Evaluation (Signed)
Occupational Therapy Evaluation Patient Details Name: Melissa Riddle MRN: [id number] DOB: 31-Jul-1928 Today's Date: 07/13/2017    History of Present Illness Fawne Hughley is a 84 y.o. female with medical history significant of COPD on 2.5 L nasal cannula, chronic diarrhea of unclear etiology on cholestyramine, hyperlipidemia who comes in with 1 week of nausea, vomiting, 1 year of diarrhea and failure to thrive.    Clinical Impression   Pt admitted with the above diagnosis. Pt currently with functional limitations due to the deficits listed below (see OT Problem List).  Pt will benefit from skilled OT to increase their safety and independence with ADL and functional mobility for ADL to facilitate discharge to venue listed below.      Follow Up Recommendations  SNF    Equipment Recommendations  None recommended by OT    Recommendations for Other Services       Precautions / Restrictions Precautions Precautions: Fall      Mobility Bed Mobility Overal bed mobility: Needs Assistance Bed Mobility: Supine to Sit;Sit to Supine     Supine to sit: Max assist Sit to supine: Max assist   General bed mobility comments: pt needed increased time and A this day.  Transfers                 General transfer comment: unable to attempt standing this session.      Balance Overall balance assessment: Needs assistance Sitting-balance support: Bilateral upper extremity supported Sitting balance-Leahy Scale: Zero   Postural control: Posterior lean                                 ADL either performed or assessed with clinical judgement   ADL Overall ADL's : Needs assistance/impaired Eating/Feeding: Maximal assistance;Sitting Eating/Feeding Details (indicate cue type and reason): pt not able to maintain sitting balance to feed self Grooming: Maximal assistance;Sitting Grooming Details (indicate cue type and reason): pt with posterior lean in sitting.  Unable to  lift hand off bed to performing grooming task                               General ADL Comments: OT session focused on sitting balance EOB.  Pt max A to maintain sitting balance.  Pt had awareness of posterior lean but not able to self correct.       Vision Patient Visual Report: No change from baseline              Pertinent Vitals/Pain Pain Assessment: Faces           Communication Communication Communication: No difficulties                 Home Living Family/patient expects to be discharged to:: Private residence Living Arrangements: Children(staying with daughter) Available Help at Discharge: Family;Available 24 hours/day Type of Home: House Home Access: Ramped entrance;Stairs to enter(currently building ramp) Entrance Stairs-Number of Steps: 2   Home Layout: One level               Home Equipment: Walker - 4 wheels;Bedside commode;Wheelchair - manual   Additional Comments: home o2 at 3 L      Prior Functioning/Environment Level of Independence: Independent with assistive device(s)  Gait / Transfers Assistance Needed: hasn't walked in over a week. She was in rehab in [place] and didn't feel she got any stronger  Comments: Staying with daughter til she is well enough to return to [place] and her husband        OT Problem List: Decreased strength;Decreased activity tolerance;Impaired balance (sitting and/or standing);Decreased safety awareness;Decreased knowledge of use of DME or AE      OT Treatment/Interventions: Self-care/ADL training;Patient/family education    OT Goals(Current goals can be found in the care plan section) Acute Rehab OT Goals Patient Stated Goal: go to daughter's house and get therapy there OT Goal Formulation: With patient Time For Goal Achievement: 07/20/17  OT Frequency:      AM-PAC PT "6 Clicks" Daily Activity     Outcome Measure Help from another person eating meals?: A Little Help from another person  taking care of personal grooming?: A Lot Help from another person toileting, which includes using toliet, bedpan, or urinal?: Total Help from another person bathing (including washing, rinsing, drying)?: A Lot Help from another person to put on and taking off regular upper body clothing?: A Lot Help from another person to put on and taking off regular lower body clothing?: Total 6 Click Score: 11   End of Session Nurse Communication: Mobility status  Activity Tolerance: Patient limited by fatigue Patient left: in bed;with call bell/phone within reach;with family/visitor present  OT Visit Diagnosis: Unsteadiness on feet (R26.81);Other abnormalities of gait and mobility (R26.89);Repeated falls (R29.6);History of falling (Z91.81);Muscle weakness (generalized) (M62.81)                Time: 4010-2725 OT Time Calculation (min): 22 min Charges:  OT General Charges $OT Visit: 1 Visit OT Evaluation $OT Eval Moderate Complexity: 1 Mod G-Codes:     Kari Baars, Vina  Payton Mccallum D 07/13/2017, 4:15 PM

## 2017-07-13 NOTE — Progress Notes (Signed)
CSW following to assist with disposition-spoke with daughter today to follow up on plans.  Daughter reports that family wishes to pursue short term SNF admission if authorized by [facility].   Explains pt was DC'd from [facility] in [place] 07/03/17 after losing an appeal for continued authorization from [facility]. States she feels based on pt's lack of progress during that SNF stay understands there is chance SNF will not be authorized. Daughter states she is considering taking pt to her home in [place] with home health (is arranging 24 7 supervision and making accommodations in house for her, however pt's husband considering long term SNF placement in [place] where he resides- reports this would not be an immediate plan as they currently have no funds for [facility] and do not qualify for medicaid per daughter).  CSW completed FL2 and made referrals to area SNFs. Interested in returning to [facility] in [place] or to a SNF in [place] Medicare authorization (managed by [facility]).  Sharren Bridge, MSW, LCSW Clinical Social Work 07/13/2017 [phone] coverage for [phone]

## 2017-07-13 NOTE — Care Management Important Message (Signed)
Important Message  Patient Details  Name: Melissa Riddle MRN: [id number] Date of Birth: 1929/02/06   Medicare Important Message Given:  Yes    Kerin Salen 07/13/2017, 11:41 AMImportant Message  Patient Details  Name: Melissa Riddle MRN: [id number] Date of Birth: [birth year]   Medicare Important Message Given:  Yes    Kerin Salen 07/13/2017, 11:41 AM

## 2017-07-13 NOTE — NC FL2 (Signed)
[place] LEVEL OF CARE SCREENING TOOL     IDENTIFICATION  Patient Name: Melissa Riddle Birthdate: 15-Jun-1928 Sex: female Admission Date (Current Location): 07/10/2017  [facility] [place] Number:  [place] and Address:  [facility],  [place] [address], [place]      Provider Number: [id number]  Attending Physician Name and Address:  Cristal Ford, DO  Relative Name and Phone Number:       Current Level of Care: Hospital Recommended Level of Care: [place] Prior Approval Number:    Date Approved/Denied:   PASRR Number: [id number]  Discharge Plan: SNF    Current Diagnoses: Patient Active Problem List   Diagnosis Date Noted  . Malnutrition of moderate degree 07/13/2017  . COPD (chronic obstructive pulmonary disease) ([place]) 07/10/2017  . HLD (hyperlipidemia) 07/10/2017  . Acute colitis 07/10/2017  . Chronic diarrhea 07/10/2017  . FTT (failure to thrive) in adult 07/10/2017    Orientation RESPIRATION BLADDER Height & Weight     Self, Situation, Place, Time  O2(3L) Continent Weight: 118 lb (53.5 kg) Height:  5\' 4"  (162.6 cm)  BEHAVIORAL SYMPTOMS/MOOD NEUROLOGICAL BOWEL NUTRITION STATUS      Incontinent Diet(regular diet)  AMBULATORY STATUS COMMUNICATION OF NEEDS Skin   Extensive Assist Verbally Normal                       Personal Care Assistance Level of Assistance  Bathing, Feeding, Dressing Bathing Assistance: Maximum assistance Feeding assistance: Independent Dressing Assistance: Maximum assistance     Functional Limitations Info  Sight, Hearing, Speech Sight Info: Adequate Hearing Info: Adequate Speech Info: Adequate    SPECIAL CARE FACTORS FREQUENCY  PT (By licensed PT), OT (By licensed OT)     PT Frequency: 5x OT Frequency: 5x            Contractures Contractures Info: Not present    Additional Factors Info  Code Status, Allergies Code Status Info: full  code Allergies Info: nka           Current Medications (07/13/2017):  This is the current hospital active medication list Current Facility-Administered Medications  Medication Dose Route Frequency Provider Last Rate Last Dose  . acetaminophen (TYLENOL) tablet 650 mg  650 mg Oral Q6H PRN Purohit, Konrad Dolores, MD       Or  . acetaminophen (TYLENOL) suppository 650 mg  650 mg Rectal Q6H PRN Purohit, Shrey C, MD      . ampicillin-sulbactam (UNASYN) 1.5 g in sodium chloride 0.9 % 100 mL IVPB  1.5 g Intravenous Q6H Purohit, Konrad Dolores, MD   Stopped at 07/13/17 1207  . benzonatate (TESSALON) capsule 100 mg  100 mg Oral TID Cristal Ford, DO   100 mg at 07/13/17 0940  . colestipol (COLESTID) tablet 2 g  2 g Oral BID Cristal Ford, DO   2 g at 07/13/17 1059  . enoxaparin (LOVENOX) injection 40 mg  40 mg Subcutaneous Q24H Purohit, Shrey C, MD   40 mg at 07/12/17 2153  . mirtazapine (REMERON) tablet 7.5 mg  7.5 mg Oral Q24H Purohit, Shrey C, MD   7.5 mg at 07/12/17 2155  . ondansetron (ZOFRAN) tablet 4 mg  4 mg Oral Q6H PRN Purohit, Shrey C, MD       Or  . ondansetron (ZOFRAN) injection 4 mg  4 mg Intravenous Q6H PRN Purohit, Shrey C, MD      . pravastatin (PRAVACHOL) tablet 40 mg  40  mg Oral q1800 Purohit, Shrey C, MD   40 mg at 07/12/17 1734  . protein supplement (RESOURCE BENEPROTEIN) powder packet 6 g  1 scoop Oral TID WC Mikhail, Velta Addison, DO   6 g at 07/13/17 0849  . protein supplement (UNJURY CHICKEN SOUP) powder 8 oz  8 oz Oral Q2000 Cristal Ford, DO   8 oz at 07/12/17 2204     Discharge Medications: Please see discharge summary for a list of discharge medications.  Relevant Imaging Results:  Relevant Lab Results:   Additional Information SS# [id number]  Nila Nephew, LCSW

## 2017-07-14 LAB — IRON AND TIBC: Iron: 23 ug/dL — ABNORMAL LOW (ref 28–170)

## 2017-07-14 LAB — CBC
HEMATOCRIT: 27.1 % — AB (ref 36.0–46.0)
HEMOGLOBIN: 8.7 g/dL — AB (ref 12.0–15.0)
MCH: 29.2 pg (ref 26.0–34.0)
MCHC: 32.1 g/dL (ref 30.0–36.0)
MCV: 90.9 fL (ref 78.0–100.0)
Platelets: 380 10*3/uL (ref 150–400)
RBC: 2.98 MIL/uL — AB (ref 3.87–5.11)
RDW: 19.2 % — ABNORMAL HIGH (ref 11.5–15.5)
WBC: 10.1 10*3/uL (ref 4.0–10.5)

## 2017-07-14 LAB — RETICULOCYTES
RBC.: 2.98 MIL/uL — ABNORMAL LOW (ref 3.87–5.11)
RETIC CT PCT: 1.6 % (ref 0.4–3.1)
Retic Count, Absolute: 47.7 10*3/uL (ref 19.0–186.0)

## 2017-07-14 LAB — BASIC METABOLIC PANEL
ANION GAP: 6 (ref 5–15)
BUN: 11 mg/dL (ref 6–20)
CHLORIDE: 110 mmol/L (ref 101–111)
CO2: 26 mmol/L (ref 22–32)
Calcium: 8 mg/dL — ABNORMAL LOW (ref 8.9–10.3)
Creatinine, Ser: 0.53 mg/dL (ref 0.44–1.00)
GFR calc non Af Amer: >60 mL/min (ref >60)
Glucose, Bld: 84 mg/dL (ref 65–99)
Potassium: 3.5 mmol/L (ref 3.5–5.1)
Sodium: 142 mmol/L (ref 135–145)

## 2017-07-14 LAB — FERRITIN: Ferritin: 278 ng/mL (ref 11–307)

## 2017-07-14 LAB — FOLATE: Folate: 2.8 ng/mL — ABNORMAL LOW (ref >5.9)

## 2017-07-14 LAB — VITAMIN B12: VITAMIN B 12: 533 pg/mL (ref 180–914)

## 2017-07-14 MED ORDER — FUROSEMIDE 10 MG/ML IJ SOLN
20.0000 mg | Freq: Once | INTRAMUSCULAR | Status: AC
  Administered 2017-07-14: 20 mg via INTRAVENOUS
  Filled 2017-07-14: qty 2

## 2017-07-14 MED ORDER — AMOXICILLIN-POT CLAVULANATE 875-125 MG PO TABS
1.0000 | ORAL_TABLET | Freq: Two times a day (BID) | ORAL | Status: DC
  Administered 2017-07-14 – 2017-07-15 (×2): 1 via ORAL
  Filled 2017-07-14 (×3): qty 1

## 2017-07-14 MED ORDER — POTASSIUM CHLORIDE CRYS ER 10 MEQ PO TBCR
40.0000 meq | EXTENDED_RELEASE_TABLET | Freq: Two times a day (BID) | ORAL | Status: AC
  Administered 2017-07-14 – 2017-07-15 (×2): 40 meq via ORAL
  Filled 2017-07-14 (×2): qty 4

## 2017-07-14 NOTE — Progress Notes (Signed)
LCSW following for SNF placement.   Patient and family chose bed at [facility]. Patient will have bed tomorrow.   LCSW will continue to follow for disposition.   Carolin Coy [facility] [place]

## 2017-07-14 NOTE — Progress Notes (Addendum)
PROGRESS NOTE    Melissa Riddle  DQQ:[id number] DOB: [birth year] DOA: 07/10/2017 PCP: Patient, No Pcp Per   Brief Narrative:  HPI On 07/10/2017 by Dr. Brigid Re Purohit Melissa Riddle is a 84 y.o. female with medical history significant of COPD on 2.5 L nasal cannula, chronic diarrhea of unclear etiology on cholestyramine, hyperlipidemia who comes in with 1 week of nausea, vomiting, 1 year of diarrhea and failure to thrive.  On discussion with the daughter patient has had approximately 1 year of dominantly diarrhea sometimes alternating with constipation.  She was pending an outpatient GI evaluation however she was never medically stable enough to receive it.  She apparently was hospitalized in March 17 to March 29 at outside hospital in [place] with a "blockage" and then discharged for approximately 2 weeks to rehab.  Since then her daughter reports that the patient has progressively gotten weaker.  She could normally get around with a walker but is now been unable to get out of bed.  The patient apparently used to live with her husband of 44 years however her daughter recently brought her to [facility] approximately 1 week ago when she did not feel like the patient was safe at home.  The patient can barely perform most ADLs including toileting, transfer, making her own food.  Since the week that her daughter took her home she has had 3 episodes of nonbilious nonbloody emesis.  These are usually in the setting of eating food.  She has not had any abdominal pain and adamantly denies this.  She has had significantly decreased p.o. intake for the past week as well.  She denies any fevers, cough, congestion, shortness of breath, orthopnea, lower extremity edema, paroxysmal nocturnal dyspnea, rash.  Interim history Admitted for sepsis for colitis and UTI. Currently on antibiotics. Needs SNF  Assessment & Plan   Sepsis secondary to acute colitis vs ?UTI -Upon admission, patient noted to be tachycardic,  tachypneic with leukocytosis -Patient does have a history of chronic diarrhea.  Also complaining of some nausea and vomiting on admission. -CT abdomen pelvis showed extensive sigmoid diverticulosis with mild sigmoid colon wall thickening and surrounding inflammatory changes suspicious for mild diverticulitis -C. difficile PCR and GI pathogen panel negative -UA large leukocytes, many bacteria, 21-50 WBC -Blood cultures show no growth to date -Urine culture show multiple species -Placed on Unasyn, transition to Augmentin  Failure to thrive/Deconditioning/moderate malnutrition -Continues to have anorexia, weight loss, weakness -Unknown etiology -Nutrition consulted and appreciated- recommended beneprotein poweder TID, unjury chicken soup daily -PT consulted and recommended Home health, however patient appears to need max assistance with standing and unable to take steps.  -Patient truly needs SNF placement -Appetite seems to be improving -social work consulted, planning for SNF placement on 07/15/2017 if bed is available  Hypoalbuminemia -Secondary to poor oral intake -Treatment plan as above  Chronic diarrhea -Unclear etiology -Patient is supposed to follow-up with gastroenterology as an outpatient -Continue cholestyramine -Stool studies as above  Hypokalemia -Possibly secondary to poor oral intake versus chronic diarrhea -Resolved with replacement -Continue to monitor BMP  Hyperlipidemia -Continue statin  COPD with chronic hypoxia  -with chronic home oxygen use, 2.5L continuously  -Stable  Cough -Possibly secondary to COPD -Continue antitussives  -Chest x-ray obtained showing mild bilateral pleural effusions, mild basilar atelectasis.  Emphysema. -will give lasix, one dose  Liver lesion -CTA noted 2 cm hypodense lesion in segment 5 of the liver -Nonemergent MRI or ultrasound recommended for further evaluation  Abdominal aortic aneurysms -Patient  noted to have to  falciform infrarenal abdominal aortic aneurysms measuring up to 4.5 cm on CTA abdomen pelvis -Repeat CTA in 6 months and vascular consultation  Normocytic anemia -reviewed epic, no prior labs to this admission for comparison -Hemoglobin currently 8.7 -Pending anemia panel -Continue to monitor CBC closely  Hypocalcemia -Likely secondary to hypoalbuminemia -Corrected calcium within normal limits  DVT Prophylaxis  lovenox  Code Status: Full  Family Communication: None at bedside  Disposition Plan: Admitted. Pending SNF- likely on 5/22 if bed available  Consultants None  Procedures  None  Antibiotics   Anti-infectives (From admission, onward)   Start     Dose/Rate Route Frequency Ordered Stop   07/14/17 1800  amoxicillin-clavulanate (AUGMENTIN) 875-125 MG per tablet 1 tablet     1 tablet Oral Every 12 hours 07/14/17 0914     07/10/17 1800  ampicillin-sulbactam (UNASYN) 1.5 g in sodium chloride 0.9 % 100 mL IVPB  Status:  Discontinued     1.5 g 200 mL/hr over 30 Minutes Intravenous Every 6 hours 07/10/17 1744 07/14/17 0914   07/10/17 1345  levofloxacin (LEVAQUIN) IVPB 500 mg     500 mg 100 mL/hr over 60 Minutes Intravenous  Once 07/10/17 1331 07/10/17 1512   07/10/17 1345  metroNIDAZOLE (FLAGYL) IVPB 500 mg     500 mg 100 mL/hr over 60 Minutes Intravenous  Once 07/10/17 1331 07/10/17 1639      Subjective:   Melissa Riddle seen and examined today.  Denies current shortness of breath, chest pain, abdominal pain, nausea or vomiting, diarrhea or constipation.  Objective:   Vitals:   07/13/17 2030 07/14/17 0555 07/14/17 0954 07/14/17 1507  BP: (!) 118/52 133/63  128/60  Pulse: (!) 101 (!) 104  (!) 108  Resp: 16   16  Temp: 97.8 F (36.6 C) (!) 97.4 F (36.3 C) 97.6 F (36.4 C) 97.7 F (36.5 C)  TempSrc: Oral Oral Oral Oral  SpO2: 100% 100%  99%  Weight:      Height:        Intake/Output Summary (Last 24 hours) at 07/14/2017 1518 Last data filed at 07/14/2017  0931 Gross per 24 hour  Intake 240 ml  Output -  Net 240 ml   Filed Weights   07/10/17 0957  Weight: 53.5 kg (118 lb)   Exam  General: Well developed, chronically ill-appearing, no apparent distress  HEENT: NCAT,  mucous membranes moist.   Neck: Supple  Cardiovascular: S1 S2 auscultated, RRR  Respiratory: Diminished breath sounds, mild expiratory wheezing  Abdomen: Soft, nontender, nondistended, + bowel sounds  Extremities: warm dry without cyanosis clubbing. 2+ LE edema  Neuro: AAOx3, nonfocal  Psych: appropriate mood and affect  Data Reviewed: I have personally reviewed following labs and imaging studies  CBC: Recent Labs  Lab 07/10/17 1110 07/11/17 0521 07/12/17 0537 07/13/17 0552 07/14/17 0550  WBC 13.9* 10.5 9.2 9.2 10.1  NEUTROABS 11.9  --   --   --   --   HGB 11.2* 9.2* 9.1* 8.8* 8.7*  HCT 35.0* 28.9* 28.7* 27.3* 27.1*  MCV 89.3 90.0 90.5 90.1 90.9  PLT 514* 402* 374 398 119   Basic Metabolic Panel: Recent Labs  Lab 07/10/17 1110 07/11/17 0521 07/12/17 0537 07/12/17 1225 07/13/17 0552 07/14/17 0550  NA 137 137 139  --  140 142  K 3.7 3.2* 3.2*  --  3.5 3.5  CL 100* 104 108  --  110 110  CO2 24 23 22   --  24 26  GLUCOSE 69 75 74  --  101* 84  BUN 15 12 9   --  11 11  CREATININE 0.69 0.64 0.49  --  0.58 0.53  CALCIUM 8.2* 7.5* 7.3*  --  7.5* 8.0*  MG 2.0  --   --  2.2  --   --   PHOS 2.3*  --   --   --   --   --    GFR: Estimated Creatinine Clearance: 41.8 mL/min (by C-G formula based on SCr of 0.53 mg/dL). Liver Function Tests: Recent Labs  Lab 07/10/17 1110  AST 15  ALT 16  ALKPHOS 79  BILITOT 1.1  PROT 5.1*  ALBUMIN 1.5*   Recent Labs  Lab 07/10/17 1110  LIPASE 25   No results for input(s): AMMONIA in the last 168 hours. Coagulation Profile: Recent Labs  Lab 07/10/17 1110  INR 1.00   Cardiac Enzymes: No results for input(s): CKTOTAL, CKMB, CKMBINDEX, TROPONINI in the last 168 hours. BNP (last 3 results) No results  for input(s): PROBNP in the last 8760 hours. HbA1C: No results for input(s): HGBA1C in the last 72 hours. CBG: Recent Labs  Lab 07/10/17 1209  GLUCAP 53*   Lipid Profile: No results for input(s): CHOL, HDL, LDLCALC, TRIG, CHOLHDL, LDLDIRECT in the last 72 hours. Thyroid Function Tests: No results for input(s): TSH, T4TOTAL, FREET4, T3FREE, THYROIDAB in the last 72 hours. Anemia Panel: Recent Labs    07/14/17 0550  VITAMINB12 533  FOLATE 2.8*  FERRITIN 278  TIBC NOT CALCULATED  IRON 23*  RETICCTPCT 1.6   Urine analysis:    Component Value Date/Time   COLORURINE YELLOW 07/10/2017 2115   APPEARANCEUR HAZY (A) 07/10/2017 2115   LABSPEC >1.046 (H) 07/10/2017 2115   PHURINE 5.0 07/10/2017 2115   GLUCOSEU NEGATIVE 07/10/2017 2115   HGBUR MODERATE (A) 07/10/2017 2115   BILIRUBINUR NEGATIVE 07/10/2017 2115   KETONESUR 80 (A) 07/10/2017 2115   PROTEINUR NEGATIVE 07/10/2017 2115   NITRITE NEGATIVE 07/10/2017 2115   LEUKOCYTESUR LARGE (A) 07/10/2017 2115   Sepsis Labs: @LABRCNTIP (procalcitonin:4,lacticidven:4)  ) Recent Results (from the past 240 hour(s))  Culture, blood (routine x 2)     Status: None (Preliminary result)   Collection Time: 07/10/17 11:10 AM  Result Value Ref Range Status   Specimen Description   Final    BLOOD RIGHT ANTECUBITAL Performed at [facility], [place] [address], [place], [place] [address]    Special Requests   Final    BOTTLES DRAWN AEROBIC AND ANAEROBIC Blood Culture adequate volume Performed at [place] [address] [place], [place] [address]    Culture   Final    NO GROWTH 4 DAYS Performed at [facility], [place] [address], [place],  [address]    Report Status PENDING  Incomplete  Gastrointestinal Panel by PCR , Stool     Status: None   Collection Time: 07/10/17  2:21 PM  Result Value Ref Range Status   Campylobacter species NOT DETECTED NOT DETECTED Final   Plesimonas  shigelloides NOT DETECTED NOT DETECTED Final   Salmonella species NOT DETECTED NOT DETECTED Final   Yersinia enterocolitica NOT DETECTED NOT DETECTED Final   Vibrio species NOT DETECTED NOT DETECTED Final   Vibrio cholerae NOT DETECTED NOT DETECTED Final   Enteroaggregative E coli (EAEC) NOT DETECTED NOT DETECTED Final   Enteropathogenic E coli (EPEC) NOT DETECTED NOT DETECTED Final   Enterotoxigenic E coli (ETEC) NOT DETECTED NOT DETECTED Final  Shiga like toxin producing E coli (STEC) NOT DETECTED NOT DETECTED Final   Shigella/Enteroinvasive E coli (EIEC) NOT DETECTED NOT DETECTED Final   Cryptosporidium NOT DETECTED NOT DETECTED Final   Cyclospora cayetanensis NOT DETECTED NOT DETECTED Final   Entamoeba histolytica NOT DETECTED NOT DETECTED Final   Giardia lamblia NOT DETECTED NOT DETECTED Final   Adenovirus F40/41 NOT DETECTED NOT DETECTED Final   Astrovirus NOT DETECTED NOT DETECTED Final   Norovirus GI/GII NOT DETECTED NOT DETECTED Final   Rotavirus A NOT DETECTED NOT DETECTED Final   Sapovirus (I, II, IV, and V) NOT DETECTED NOT DETECTED Final    Comment: Performed at [facility], [address] [place], [place] [address]  Culture, blood (routine x 2)     Status: None (Preliminary result)   Collection Time: 07/10/17  5:40 PM  Result Value Ref Range Status   Specimen Description   Final    BLOOD RIGHT HAND Performed at [place] [address], [place], [place] [address]    Special Requests   Final    BOTTLES DRAWN AEROBIC ONLY Blood Culture results may not be optimal due to an inadequate volume of blood received in culture bottles Performed at [place] [address], [place], [place] [address]    Culture   Final    NO GROWTH 4 DAYS Performed at [facility], [place] [address], [place], [place] [address]    Report Status PENDING  Incomplete  Urine culture     Status: Abnormal   Collection Time: 07/10/17  9:15  PM  Result Value Ref Range Status   Specimen Description   Final    URINE, RANDOM Performed at [place] [address] [place], [place] [address]    Special Requests   Final    NONE Performed at [facility], [place] [address], [place], [place] [address]    Culture MULTIPLE SPECIES PRESENT, SUGGEST RECOLLECTION (A)  Final   Report Status 07/12/2017 FINAL  Final  C difficile quick scan w PCR reflex     Status: None   Collection Time: 07/10/17  9:54 PM  Result Value Ref Range Status   C Diff antigen NEGATIVE NEGATIVE Final   C Diff toxin NEGATIVE NEGATIVE Final   C Diff interpretation No C. difficile detected.  Final    Comment: Performed at [facility], [place] [address] [place], [place] [address]      Radiology Studies: Dg Chest Port 1 View  Result Date: 07/13/2017 CLINICAL DATA:  Cough and shortness of breath. EXAM: PORTABLE CHEST 1 VIEW COMPARISON:  CT chest, abdomen and pelvis 07/10/2017. FINDINGS: There are small bilateral pleural effusions and mild basilar atelectasis as seen on the comparison CT. The lungs are emphysematous. No pneumothorax. Aortic atherosclerosis is noted. Heart size is normal. IMPRESSION: Small bilateral pleural effusions and mild basilar atelectasis. Atherosclerosis. Emphysema. Electronically Signed   By: Inge Rise M.D.   On: 07/13/2017 14:01     Scheduled Meds: . amoxicillin-clavulanate  1 tablet Oral Q12H  . benzonatate  100 mg Oral TID  . colestipol  2 g Oral BID  . enoxaparin (LOVENOX) injection  40 mg Subcutaneous Q24H  . mirtazapine  7.5 mg Oral Q24H  . pravastatin  40 mg Oral q1800  . protein supplement  1 scoop Oral TID WC  . protein supplement  8 oz Oral Q2000   Continuous Infusions:    LOS: 4 days  Time Spent in minutes   30 minutes  Malic Rosten D.O. on 07/14/2017 at 3:18 PM  Between 7am to 7pm - Pager - [phone]  After 7pm go to [email or web address] - password  [email or web address]  And look for the night coverage person covering for me after hours  [facility] Office  [phone]

## 2017-07-14 NOTE — Progress Notes (Signed)
Physical Therapy Treatment Patient Details Name: Melissa Riddle MRN: [id number] DOB: June 06, 1928 Today's Date: 07/14/2017    History of Present Illness Melissa Riddle is a 84 y.o. female with medical history significant of COPD on 2.5 L nasal cannula, chronic diarrhea of unclear etiology on cholestyramine, hyperlipidemia who comes in with 1 week of nausea, vomiting, 1 year of diarrhea and failure to thrive.     PT Comments    Pt in bed on 3 lts nasal AxO x3 wanting to use bathroom but then stated she was on the bed pan (which was not).  Pt required Much assist to transfer from supine to EOB then with poor ability to stay upright.  Forward flex posture and MAX c/o weakness/fatigue pt required Max Assist to prevent sitting LOB.  General transfer comment: attempted sit to stand with walker however pt unable to rise enogh for hips to clearf bed.  Very weak.  Assisted from elevated bed to [facility] via "Bear Hug" with increased assist to complete 1/4 pivot turn.  Pt required + 2 assist from lower BSC height.  General Gait Details: unable to attempt due to low transfer ability and severe deconditioning.    Follow Up Recommendations  SNF     Equipment Recommendations  None recommended by PT    Recommendations for Other Services       Precautions / Restrictions Precautions Precautions: Fall Precaution Comments: COPD Restrictions Weight Bearing Restrictions: No    Mobility  Bed Mobility Overal bed mobility: Needs Assistance Bed Mobility: Supine to Sit;Sit to Supine     Supine to sit: Max assist Sit to supine: Total assist   General bed mobility comments: pt needed increased time and A back to bed  Transfers Overall transfer level: Needs assistance Equipment used: None Transfers: Stand Pivot Transfers Sit to Stand: Max assist Stand pivot transfers: Total assist       General transfer comment: attempted sit to stand with walker however pt unable to rise enogh for hips to clearf bed.   Very weak.  Assisted from elevated bed to [facility] via "Bear Hug" with increased assist to complete 1/4 pivot turn.  Pt required + 2 assist from lower BSC hight.    Ambulation/Gait             General Gait Details: unable to attempt due to low transfer ability   Stairs             Wheelchair Mobility    Modified Rankin (Stroke Patients Only)       Balance                                            Cognition Arousal/Alertness: Awake/alert Behavior During Therapy: WFL for tasks assessed/performed Overall Cognitive Status: Within Functional Limits for tasks assessed                                        Exercises      General Comments        Pertinent Vitals/Pain      Home Living                      Prior Function            PT Goals (current goals can now  be found in the care plan section) Progress towards PT goals: Progressing toward goals    Frequency    Min 3X/week      PT Plan Current plan remains appropriate    Co-evaluation              AM-PAC PT "6 Clicks" Daily Activity  Outcome Measure  Difficulty turning over in bed (including adjusting bedclothes, sheets and blankets)?: Unable Difficulty moving from lying on back to sitting on the side of the bed? : Unable Difficulty sitting down on and standing up from a chair with arms (e.g., wheelchair, bedside commode, etc,.)?: Unable Help needed moving to and from a bed to chair (including a wheelchair)?: Total Help needed walking in hospital room?: Total Help needed climbing 3-5 steps with a railing? : Total 6 Click Score: 6    End of Session Equipment Utilized During Treatment: Gait belt Activity Tolerance: Patient limited by fatigue Patient left: in bed;with bed alarm set Nurse Communication: Mobility status PT Visit Diagnosis: Unsteadiness on feet (R26.81);Muscle weakness (generalized) (M62.81)     Time: 0109-3235 PT Time  Calculation (min) (ACUTE ONLY): 26 min  Charges:  $Therapeutic Activity: 23-37 mins                    G Codes:       Rica Koyanagi  PTA [facility]  Acute  Rehab Pager      [phone]

## 2017-07-15 DIAGNOSIS — K5732 Diverticulitis of large intestine without perforation or abscess without bleeding: Secondary | ICD-10-CM

## 2017-07-15 LAB — BASIC METABOLIC PANEL
ANION GAP: 9 (ref 5–15)
BUN: 11 mg/dL (ref 6–20)
CALCIUM: 7.8 mg/dL — AB (ref 8.9–10.3)
CO2: 26 mmol/L (ref 22–32)
CREATININE: 0.55 mg/dL (ref 0.44–1.00)
Chloride: 105 mmol/L (ref 101–111)
GFR calc Af Amer: >60 mL/min (ref >60)
Glucose, Bld: 79 mg/dL (ref 65–99)
Potassium: 3.5 mmol/L (ref 3.5–5.1)
SODIUM: 140 mmol/L (ref 135–145)

## 2017-07-15 LAB — CBC
HCT: 27.6 % — ABNORMAL LOW (ref 36.0–46.0)
Hemoglobin: 8.7 g/dL — ABNORMAL LOW (ref 12.0–15.0)
MCH: 28.7 pg (ref 26.0–34.0)
MCHC: 31.5 g/dL (ref 30.0–36.0)
MCV: 91.1 fL (ref 78.0–100.0)
PLATELETS: 362 10*3/uL (ref 150–400)
RBC: 3.03 MIL/uL — ABNORMAL LOW (ref 3.87–5.11)
RDW: 19.2 % — AB (ref 11.5–15.5)
WBC: 7.2 10*3/uL (ref 4.0–10.5)

## 2017-07-15 LAB — CULTURE, BLOOD (ROUTINE X 2)
Culture: NO GROWTH
Culture: NO GROWTH
Special Requests: ADEQUATE

## 2017-07-15 MED ORDER — AMOXICILLIN-POT CLAVULANATE 875-125 MG PO TABS: 1.0000 | ORAL_TABLET | Freq: Two times a day (BID) | ORAL | 0 refills | Status: AC

## 2017-07-15 MED ORDER — MIRTAZAPINE 7.5 MG PO TABS: 7.5000 mg | ORAL_TABLET | ORAL | 0 refills | Status: DC

## 2017-07-15 MED ORDER — ONDANSETRON HCL 4 MG PO TABS: 4.0000 mg | ORAL_TABLET | Freq: Four times a day (QID) | ORAL | 0 refills | Status: DC | PRN

## 2017-07-15 NOTE — Progress Notes (Addendum)
CSW received phone call from charge nurse. Pt requires transport via [facility] to nursing home. [facility]. CSW called and scheduled PT[facility] transportation. PTAR required because pt needs a 3 person assist team to transfer. Pt is unable to safely ambulate and has oxygen.   Wendelyn Breslow, [facility]  [phone]

## 2017-07-15 NOTE — Progress Notes (Signed)
Occupational Therapy Treatment Patient Details Name: Joelie Schou MRN: [id number] DOB: 03/13/1928 Today's Date: 07/15/2017    History of present illness Chakira Jachim is a 84 y.o. female with medical history significant of COPD on 2.5 L nasal cannula, chronic diarrhea of unclear etiology on cholestyramine, hyperlipidemia who comes in with 1 week of nausea, vomiting, 1 year of diarrhea and failure to thrive.    OT comments  Pt soaked in urine nd had not called for A.  Pt performed rolling in bed for clean up and changing bed.  Pt max A- total A  Follow Up Recommendations  SNF    Equipment Recommendations  None recommended by OT    Recommendations for Other Services      Precautions / Restrictions Precautions Precautions: Fall Precaution Comments: COPD Restrictions Weight Bearing Restrictions: No       Mobility Bed Mobility Overal bed mobility: Needs Assistance Bed Mobility: Supine to Sit;Sit to Supine     Supine to sit: Total assist Sit to supine: Total assist   General bed mobility comments: pt needed increased time and A back to bed  Transfers   Equipment used: None             General transfer comment: pt did not perform    Balance Overall balance assessment: Needs assistance Sitting-balance support: Bilateral upper extremity supported Sitting balance-Leahy Scale: Zero   Postural control: Posterior lean                                 ADL either performed or assessed with clinical judgement   ADL Overall ADL's : Needs assistance/impaired     Grooming: Sitting;Maximal assistance Grooming Details (indicate cue type and reason): washing face in sitting                   Toilet Transfer Details (indicate cue type and reason): pt soaked in urine. returned to supine for cleaning                 Vision Patient Visual Report: No change from baseline     Perception     Praxis      Cognition Arousal/Alertness:  Awake/alert Behavior During Therapy: WFL for tasks assessed/performed Overall Cognitive Status: Within Functional Limits for tasks assessed                                          Exercises     Shoulder Instructions       General Comments      Pertinent Vitals/ Pain       Pain Score: 0-No pain  Home Living                                          Prior Functioning/Environment              Frequency  Min 2X/week        Progress Toward Goals  OT Goals(current goals can now be found in the care plan section)  Progress towards OT goals: Progressing toward goals     Plan Discharge plan remains appropriate    Co-evaluation                 AM-PAC  PT "6 Clicks" Daily Activity     Outcome Measure   Help from another person eating meals?: A Little Help from another person taking care of personal grooming?: A Lot Help from another person toileting, which includes using toliet, bedpan, or urinal?: Total Help from another person bathing (including washing, rinsing, drying)?: A Lot Help from another person to put on and taking off regular upper body clothing?: A Lot Help from another person to put on and taking off regular lower body clothing?: Total 6 Click Score: 11    End of Session    OT Visit Diagnosis: Unsteadiness on feet (R26.81);Other abnormalities of gait and mobility (R26.89);Repeated falls (R29.6);History of falling (Z91.81);Muscle weakness (generalized) (M62.81)   Activity Tolerance Patient limited by fatigue   Patient Left in bed;with call bell/phone within reach;with family/visitor present   Nurse Communication Mobility status        Time: 3112-1624 OT Time Calculation (min): 25 min  Charges:    Kari Baars, OT [phone]   Payton Mccallum D 07/15/2017, 3:18 PM

## 2017-07-15 NOTE — Progress Notes (Signed)
Report given to [facility].

## 2017-07-15 NOTE — Clinical Social Work Placement (Addendum)
    10:29 AM   Patient and family chose bed at [facility].  LCSW confirmed bed with facility.  Room 713  LCSW faxed dc docs to facility.   Patient's daughter will transport.   RN report # [phone]  BKJ  CLINICAL SOCIAL WORK PLACEMENT  NOTE  Date:  07/15/2017  Patient Details  Name: Melissa Riddle MRN: [id number] Date of Birth: 02/15/1929  Clinical Social Work is seeking post-discharge placement for this patient at the [place] level of care (*CSW will initial, date and re-position this form in  chart as items are completed):  Yes   Patient/family provided with [place] Work Department's list of facilities offering this level of care within the geographic area requested by the patient (or if unable, by the patient's family).  Yes   Patient/family informed of their freedom to choose among providers that offer the needed level of care, that participate in Medicare, Medicaid or managed care program needed by the patient, have an available bed and are willing to accept the patient.  Yes   Patient/family informed of [facility]'s ownership interest in [facility] and [facility], as well as of the fact that they are under no obligation to receive care at these facilities.  PASRR submitted to EDS on       PASRR number received on 07/13/17     Existing PASRR number confirmed on       FL2 transmitted to all facilities in geographic area requested by pt/family on 07/13/17     FL2 transmitted to all facilities within larger geographic area on       Patient informed that his/her managed care company has contracts with or will negotiate with certain facilities, including the following:        Yes   Patient/family informed of bed offers received.  Patient chooses bed at [facility], [facility]     Physician recommends and patient chooses bed at      Patient to be transferred to [facility] on 07/15/17.  Patient to be  transferred to facility by Family     Patient family notified on 07/15/17 of transfer.  Name of family member notified:  Melissa Riddle     PHYSICIAN Please prepare priority discharge summary, including medications     Additional Comment:    _______________________________________________ Servando Snare, LCSW 07/15/2017, 10:29 AM

## 2017-07-15 NOTE — Discharge Summary (Signed)
Physician Discharge Summary  Melissa Riddle SKA:[id number] DOB: 1928-07-04 DOA: 07/10/2017  PCP: Patient, No Pcp Per  Admit date: 07/10/2017 Discharge date: 07/15/2017  Admitted From: Home  disposition: Nursing home  Recommendations for Outpatient Follow-up:  1. Follow up with nursing home provider at earliest convenience with repeat CBC/BMP 2. Follow-up with gastroenterology as an outpatient   Home Health: No Equipment/Devices: None  Discharge Condition: Stable CODE STATUS: Full Diet recommendation: Heart Healthy   Brief/Interim Summary: 84 year old female with history of COPD, chronic hypoxic respiratory failure on home oxygen, chronic diarrhea of unclear etiology, hyperlipidemia presented on 07/10/2017 with nausea, vomiting and diarrhea with failure to thrive.  She was admitted for sepsis due to colitis and UTI and started on intravenous antibiotics.  CT of abdomen showed mild diverticulitis.  Her condition improved.  Cultures have been negative so far, C. difficile was negative.  She was transitioned to oral antibiotics.  She will be discharged to nursing home once bed is available on oral antibiotics.  Discharge Diagnoses:  Principal Problem:   Acute colitis Active Problems:   COPD (chronic obstructive pulmonary disease) (HCC)   HLD (hyperlipidemia)   Chronic diarrhea   FTT (failure to thrive) in adult   Malnutrition of moderate degree  Sepsis secondary to acute colitis versus probable UTI -Hemodynamically stable.  Sepsis has resolved.  Cultures have been negative so far.  Antibiotic plan as below  Mild diverticulitis -CT abdomen pelvis showed extensive sigmoid diverticulitis and mild diverticulitis.  C. difficile was negative -Initially started on IV antibiotics and switched to oral Augmentin on 07/14/2017.  Continue Augmentin for 3 more days upon discharge.  Outpatient follow-up with GI as recommended.  Probable UTI -Urine culture showed multiple species.  Antibiotic plan  as above  Failure to thrive/deconditioning/moderate malnutrition -Continue PT at nursing home.  Patient will need continued follow-up with dietary/nutrition services  Chronic diarrhea -Questionable cause.  Outpatient follow-up with GI -Continue to cholestyramine  Hypoalbuminemia  -secondary to poor oral intake -Plan as above  Hyperlipidemia -Continue statin  COPD with chronic hypoxic respiratory failure -Continue home oxygen.  Currently stable.  Outpatient follow-up  Hypokalemia -Resolved.  Outpatient follow-up  Normocytic anemia -Hemoglobin stable.  Outpatient follow-up  Abdominal aortic aneurysm -CTA abdomen pelvis showed infrarenal abdominal aortic aneurysm measuring 4.5 cm -Repeat CTA in 6 months and consider vascular surgery consultation as an outpatient    Discharge Instructions  Discharge Instructions    Ambulatory referral to Gastroenterology   Complete by:  As directed    Chronic diarrhea   What is the reason for referral?:  Other   Call MD for:  difficulty breathing, headache or visual disturbances   Complete by:  As directed    Call MD for:  extreme fatigue   Complete by:  As directed    Call MD for:  hives   Complete by:  As directed    Call MD for:  persistant dizziness or light-headedness   Complete by:  As directed    Call MD for:  persistant nausea and vomiting   Complete by:  As directed    Call MD for:  severe uncontrolled pain   Complete by:  As directed    Call MD for:  temperature >100.4   Complete by:  As directed    Diet - low sodium heart healthy   Complete by:  As directed    Increase activity slowly   Complete by:  As directed      Allergies as of 07/15/2017  No Known Allergies     Medication List    STOP taking these medications   promethazine 25 MG tablet Commonly known as:  PHENERGAN     TAKE these medications   albuterol 108 (90 Base) MCG/ACT inhaler Commonly known as:  PROVENTIL HFA;VENTOLIN HFA Inhale 2 puffs into  the lungs daily.   albuterol (2.5 MG/3ML) 0.083% nebulizer solution Commonly known as:  PROVENTIL Take 2.5 mg by nebulization every 6 (six) hours as needed for wheezing or shortness of breath.   amoxicillin-clavulanate 875-125 MG tablet Commonly known as:  AUGMENTIN Take 1 tablet by mouth every 12 (twelve) hours for 3 days.   cholestyramine 4 g packet Commonly known as:  QUESTRAN Take 4 g by mouth 2 (two) times daily.   lovastatin 40 MG tablet Commonly known as:  MEVACOR Take 40 mg by mouth at bedtime.   mirtazapine 7.5 MG tablet Commonly known as:  REMERON Take 1 tablet (7.5 mg total) by mouth daily.   ondansetron 4 MG tablet Commonly known as:  ZOFRAN Take 1 tablet (4 mg total) by mouth every 6 (six) hours as needed for nausea.      Contact information for after-discharge care    Destination    [facility] .   Service:  Skilled Nursing Contact information: [place] [phone]             No Known Allergies  Consultations:  None   Procedures/Studies: Ct Chest W Contrast  Result Date: 07/10/2017 CLINICAL DATA:  Weakness, shortness of breath, and weight loss. History of breast cancer. EXAM: CT CHEST, ABDOMEN, AND PELVIS WITH CONTRAST TECHNIQUE: Multidetector CT imaging of the chest, abdomen and pelvis was performed following the standard protocol during bolus administration of intravenous contrast. CONTRAST:  168mL ISOVUE-300 IOPAMIDOL (ISOVUE-300) INJECTION 61% COMPARISON:  None. FINDINGS: CT CHEST FINDINGS Cardiovascular: Normal heart size. No pericardial effusion. Normal caliber thoracic aorta. Coronary, aortic arch, and branch vessel atherosclerotic vascular disease. No pulmonary embolism. Mediastinum/Nodes: No enlarged mediastinal, hilar, or axillary lymph nodes. Prior left axillary lymph node dissection. Thyroid gland, trachea, and esophagus demonstrate no significant findings. Small spiculated,  calcified lesion at the right cardiophrenic angle is of unclear etiology, possibly an old calcified lymph node, but likely of no clinical significance. Lungs/Pleura: Small left greater than right pleural effusions. Severe upper lobe predominant centrilobular emphysema. Mild lower lobe predominant peribronchial thickening. Biapical pleuroparenchymal scarring. Scattered minimally increased subpleural reticulation and peribronchovascular nodularity involving both lungs, sparing the right middle lobe. No consolidation or pneumothorax. No suspicious pulmonary nodule. Focal area of bronchiectasis in the left upper lobe is likely postinfectious or postinflammatory. Musculoskeletal: Prior left mastectomy. No acute or significant osseous findings. CT ABDOMEN PELVIS FINDINGS Hepatobiliary: 2.0 cm hypodense lesion in segment 5 of the liver. Status post cholecystectomy. No biliary dilatation. Pancreas: Moderate pancreatic atrophy. No ductal dilatation or surrounding inflammatory changes. Spleen: Normal in size. Multiple small calcifications, consistent with prior granulomatous disease. Adrenals/Urinary Tract: The adrenal glands are unremarkable. Moderate bilateral renal cortical atrophy. No focal renal lesion. No renal or ureteral calculi. No hydronephrosis. The bladder is unremarkable. Stomach/Bowel: Stomach is within normal limits. Appendix is not definitively visualized, however there are no secondary signs of inflammation in the right lower quadrant. 2.7 cm duodenal diverticulum arising from the second portion. No bowel obstruction. Extensive sigmoid diverticulosis. Mild wall thickening of the sigmoid colon with mild surrounding fat stranding. Moderate stool throughout the colon. Vascular/Lymphatic: There are two fusiform infrarenal abdominal  aortic aneurysms. The more superior aneurysm measures 4.5 cm in maximum dimension, while the more inferior aneurysm measures up to 3.3 cm in maximum dimension. Extensive aortoiliac  atherosclerotic vascular calcifications. Moderate to severe stenosis of the right renal artery origin. No enlarged abdominal or pelvic lymph nodes. Reproductive: Uterus and bilateral adnexa are unremarkable for the patient's age. Other: No abdominal wall hernia or pneumoperitoneum. No fluid collection. Musculoskeletal: No acute or significant osseous findings. Old L4 compression deformity status post cement augmentation. IMPRESSION: Chest: 1.  No acute intrathoracic process. 2. Small left greater than right pleural effusions. 3. COPD and chronic small airways disease.  Emphysema (ICD10-J43.9). 4.  Aortic atherosclerosis (ICD10-I70.0). Abdomen and pelvis: 1. Extensive sigmoid diverticulosis with mild sigmoid colon wall thickening and surrounding inflammatory changes, suspicious for mild sigmoid diverticulitis. No evidence of perforation or drainable fluid collection. 2. Indeterminate 2.0 cm hypodense lesion in segment 5 of the liver. Recommend further evaluation with ultrasound or nonemergent MRI of the liver with and without contrast (if the patient can hold her breath appropriately). 3. Two fusiform infrarenal abdominal aortic aneurysms measuring up to 4.5 cm in maximal dimension. Recommend followup by abdomen and pelvis CTA in 6 months, and vascular surgery referral/consultation if not already obtained. This recommendation follows ACR consensus guidelines: White Paper of the ACR Incidental Findings Committee II on Vascular Findings. J Am Coll Radiol 2013; 10:789-794. Electronically Signed   By: Titus Dubin M.D.   On: 07/10/2017 13:15   Ct Abdomen Pelvis W Contrast  Result Date: 07/10/2017 CLINICAL DATA:  Weakness, shortness of breath, and weight loss. History of breast cancer. EXAM: CT CHEST, ABDOMEN, AND PELVIS WITH CONTRAST TECHNIQUE: Multidetector CT imaging of the chest, abdomen and pelvis was performed following the standard protocol during bolus administration of intravenous contrast. CONTRAST:   157mL ISOVUE-300 IOPAMIDOL (ISOVUE-300) INJECTION 61% COMPARISON:  None. FINDINGS: CT CHEST FINDINGS Cardiovascular: Normal heart size. No pericardial effusion. Normal caliber thoracic aorta. Coronary, aortic arch, and branch vessel atherosclerotic vascular disease. No pulmonary embolism. Mediastinum/Nodes: No enlarged mediastinal, hilar, or axillary lymph nodes. Prior left axillary lymph node dissection. Thyroid gland, trachea, and esophagus demonstrate no significant findings. Small spiculated, calcified lesion at the right cardiophrenic angle is of unclear etiology, possibly an old calcified lymph node, but likely of no clinical significance. Lungs/Pleura: Small left greater than right pleural effusions. Severe upper lobe predominant centrilobular emphysema. Mild lower lobe predominant peribronchial thickening. Biapical pleuroparenchymal scarring. Scattered minimally increased subpleural reticulation and peribronchovascular nodularity involving both lungs, sparing the right middle lobe. No consolidation or pneumothorax. No suspicious pulmonary nodule. Focal area of bronchiectasis in the left upper lobe is likely postinfectious or postinflammatory. Musculoskeletal: Prior left mastectomy. No acute or significant osseous findings. CT ABDOMEN PELVIS FINDINGS Hepatobiliary: 2.0 cm hypodense lesion in segment 5 of the liver. Status post cholecystectomy. No biliary dilatation. Pancreas: Moderate pancreatic atrophy. No ductal dilatation or surrounding inflammatory changes. Spleen: Normal in size. Multiple small calcifications, consistent with prior granulomatous disease. Adrenals/Urinary Tract: The adrenal glands are unremarkable. Moderate bilateral renal cortical atrophy. No focal renal lesion. No renal or ureteral calculi. No hydronephrosis. The bladder is unremarkable. Stomach/Bowel: Stomach is within normal limits. Appendix is not definitively visualized, however there are no secondary signs of inflammation in the  right lower quadrant. 2.7 cm duodenal diverticulum arising from the second portion. No bowel obstruction. Extensive sigmoid diverticulosis. Mild wall thickening of the sigmoid colon with mild surrounding fat stranding. Moderate stool throughout the colon. Vascular/Lymphatic: There are two fusiform infrarenal abdominal  aortic aneurysms. The more superior aneurysm measures 4.5 cm in maximum dimension, while the more inferior aneurysm measures up to 3.3 cm in maximum dimension. Extensive aortoiliac atherosclerotic vascular calcifications. Moderate to severe stenosis of the right renal artery origin. No enlarged abdominal or pelvic lymph nodes. Reproductive: Uterus and bilateral adnexa are unremarkable for the patient's age. Other: No abdominal wall hernia or pneumoperitoneum. No fluid collection. Musculoskeletal: No acute or significant osseous findings. Old L4 compression deformity status post cement augmentation. IMPRESSION: Chest: 1.  No acute intrathoracic process. 2. Small left greater than right pleural effusions. 3. COPD and chronic small airways disease.  Emphysema (ICD10-J43.9). 4.  Aortic atherosclerosis (ICD10-I70.0). Abdomen and pelvis: 1. Extensive sigmoid diverticulosis with mild sigmoid colon wall thickening and surrounding inflammatory changes, suspicious for mild sigmoid diverticulitis. No evidence of perforation or drainable fluid collection. 2. Indeterminate 2.0 cm hypodense lesion in segment 5 of the liver. Recommend further evaluation with ultrasound or nonemergent MRI of the liver with and without contrast (if the patient can hold her breath appropriately). 3. Two fusiform infrarenal abdominal aortic aneurysms measuring up to 4.5 cm in maximal dimension. Recommend followup by abdomen and pelvis CTA in 6 months, and vascular surgery referral/consultation if not already obtained. This recommendation follows ACR consensus guidelines: White Paper of the ACR Incidental Findings Committee II on Vascular  Findings. J Am Coll Radiol 2013; 10:789-794. Electronically Signed   By: Titus Dubin M.D.   On: 07/10/2017 13:15   Dg Chest Port 1 View  Result Date: 07/13/2017 CLINICAL DATA:  Cough and shortness of breath. EXAM: PORTABLE CHEST 1 VIEW COMPARISON:  CT chest, abdomen and pelvis 07/10/2017. FINDINGS: There are small bilateral pleural effusions and mild basilar atelectasis as seen on the comparison CT. The lungs are emphysematous. No pneumothorax. Aortic atherosclerosis is noted. Heart size is normal. IMPRESSION: Small bilateral pleural effusions and mild basilar atelectasis. Atherosclerosis. Emphysema. Electronically Signed   By: Inge Rise M.D.   On: 07/13/2017 14:01      Subjective: Patient seen and examined at bedside.  She denies any overnight fever, nausea or vomiting.  Discharge Exam: Vitals:   07/15/17 0548 07/15/17 0654  BP: (!) 111/54   Pulse: (!) 102   Resp: (!) 24 19  Temp: (!) 97.5 F (36.4 C)   SpO2: 95%    Vitals:   07/14/17 1507 07/14/17 1952 07/15/17 0548 07/15/17 0654  BP: 128/60 (!) 101/49 (!) 111/54   Pulse: (!) 108 (!) 111 (!) 102   Resp: 16 (!) 26 (!) 24 19  Temp: 97.7 F (36.5 C) 98.1 F (36.7 C) (!) 97.5 F (36.4 C)   TempSrc: Oral Oral Oral   SpO2: 99% 100% 95%   Weight:      Height:        General: Pt is alert, awake, not in acute distress.  Elderly female lying in bed Cardiovascular: Slightly tachycardic, S1/S2 + Respiratory: Bilateral decreased breath sounds at bases  abdominal: Soft, NT, ND, bowel sounds + Extremities: no edema, no cyanosis    The results of significant diagnostics from this hospitalization (including imaging, microbiology, ancillary and laboratory) are listed below for reference.     Microbiology: Recent Results (from the past 240 hour(s))  Culture, blood (routine x 2)     Status: None (Preliminary result)   Collection Time: 07/10/17 11:10 AM  Result Value Ref Range Status   Specimen Description   Final     BLOOD RIGHT ANTECUBITAL Performed at [facility], [address] [place], [place] [address]    Special Requests   Final    BOTTLES DRAWN AEROBIC AND ANAEROBIC Blood Culture adequate volume Performed at [facility] [address] [place], [place] [address]    Culture   Final    NO GROWTH 4 DAYS Performed at [facility], [place] [address], [place], [place] [address]    Report Status PENDING  Incomplete  Gastrointestinal Panel by PCR , Stool     Status: None   Collection Time: 07/10/17  2:21 PM  Result Value Ref Range Status   Campylobacter species NOT DETECTED NOT DETECTED Final   Plesimonas shigelloides NOT DETECTED NOT DETECTED Final   Salmonella species NOT DETECTED NOT DETECTED Final   Yersinia enterocolitica NOT DETECTED NOT DETECTED Final   Vibrio species NOT DETECTED NOT DETECTED Final   Vibrio cholerae NOT DETECTED NOT DETECTED Final   Enteroaggregative E coli (EAEC) NOT DETECTED NOT DETECTED Final   Enteropathogenic E coli (EPEC) NOT DETECTED NOT DETECTED Final   Enterotoxigenic E coli (ETEC) NOT DETECTED NOT DETECTED Final   Shiga like toxin producing E coli (STEC) NOT DETECTED NOT DETECTED Final   Shigella/Enteroinvasive E coli (EIEC) NOT DETECTED NOT DETECTED Final   Cryptosporidium NOT DETECTED NOT DETECTED Final   Cyclospora cayetanensis NOT DETECTED NOT DETECTED Final   Entamoeba histolytica NOT DETECTED NOT DETECTED Final   Giardia lamblia NOT DETECTED NOT DETECTED Final   Adenovirus F40/41 NOT DETECTED NOT DETECTED Final   Astrovirus NOT DETECTED NOT DETECTED Final   Norovirus GI/GII NOT DETECTED NOT DETECTED Final   Rotavirus A NOT DETECTED NOT DETECTED Final   Sapovirus (I, II, IV, and V) NOT DETECTED NOT DETECTED Final    Comment: Performed at [facility], [address] [place], [place] [address]  Culture, blood (routine x 2)     Status: None (Preliminary result)   Collection Time: 07/10/17  5:40 PM   Result Value Ref Range Status   Specimen Description   Final    BLOOD RIGHT HAND Performed at [facility], [place] [address] [place], [place] [address]    Special Requests   Final    BOTTLES DRAWN AEROBIC ONLY Blood Culture results may not be optimal due to an inadequate volume of blood received in culture bottles Performed at [facility] [address] [place], [place] [address]    Culture   Final    NO GROWTH 4 DAYS Performed at [facility], [place] [address], [place], [place] [address]    Report Status PENDING  Incomplete  Urine culture     Status: Abnormal   Collection Time: 07/10/17  9:15 PM  Result Value Ref Range Status   Specimen Description   Final    URINE, RANDOM Performed at [place] [facility] [address], [place], [place] [address]    Special Requests   Final    NONE Performed at [facility], [place] [address] [place], [place] [address]    Culture MULTIPLE SPECIES PRESENT, SUGGEST RECOLLECTION (A)  Final   Report Status 07/12/2017 FINAL  Final  C difficile quick scan w PCR reflex     Status: None   Collection Time: 07/10/17  9:54 PM  Result Value Ref Range Status   C Diff antigen NEGATIVE NEGATIVE Final   C Diff toxin NEGATIVE NEGATIVE Final   C Diff interpretation No C. difficile detected.  Final    Comment:  Performed at [facility], [place] [address], [place], [place] [address]     Labs: BNP (last 3 results) Recent Labs    07/10/17 1110  BNP 841.6*   Basic Metabolic Panel: Recent Labs  Lab 07/10/17 1110 07/11/17 0521 07/12/17 0537 07/12/17 1225 07/13/17 0552 07/14/17 0550 07/15/17 0602  NA 137 137 139  --  140 142 140  K 3.7 3.2* 3.2*  --  3.5 3.5 3.5  CL 100* 104 108  --  110 110 105  CO2 24 23 22   --  24 26 26   GLUCOSE 69 75 74  --  101* 84 79  BUN 15 12 9   --  11 11 11   CREATININE 0.69 0.64 0.49  --  0.58 0.53 0.55  CALCIUM 8.2* 7.5* 7.3*  --   7.5* 8.0* 7.8*  MG 2.0  --   --  2.2  --   --   --   PHOS 2.3*  --   --   --   --   --   --    Liver Function Tests: Recent Labs  Lab 07/10/17 1110  AST 15  ALT 16  ALKPHOS 79  BILITOT 1.1  PROT 5.1*  ALBUMIN 1.5*   Recent Labs  Lab 07/10/17 1110  LIPASE 25   No results for input(s): AMMONIA in the last 168 hours. CBC: Recent Labs  Lab 07/10/17 1110 07/11/17 0521 07/12/17 0537 07/13/17 0552 07/14/17 0550 07/15/17 0602  WBC 13.9* 10.5 9.2 9.2 10.1 7.2  NEUTROABS 11.9  --   --   --   --   --   HGB 11.2* 9.2* 9.1* 8.8* 8.7* 8.7*  HCT 35.0* 28.9* 28.7* 27.3* 27.1* 27.6*  MCV 89.3 90.0 90.5 90.1 90.9 91.1  PLT 514* 402* 374 398 380 362   Cardiac Enzymes: No results for input(s): CKTOTAL, CKMB, CKMBINDEX, TROPONINI in the last 168 hours. BNP: Invalid input(s): POCBNP CBG: Recent Labs  Lab 07/10/17 1209  GLUCAP 53*   D-Dimer No results for input(s): DDIMER in the last 72 hours. Hgb A1c No results for input(s): HGBA1C in the last 72 hours. Lipid Profile No results for input(s): CHOL, HDL, LDLCALC, TRIG, CHOLHDL, LDLDIRECT in the last 72 hours. Thyroid function studies No results for input(s): TSH, T4TOTAL, T3FREE, THYROIDAB in the last 72 hours.  Invalid input(s): FREET3 Anemia work up Recent Labs    07/14/17 0550  VITAMINB12 533  FOLATE 2.8*  FERRITIN 278  TIBC NOT CALCULATED  IRON 23*  RETICCTPCT 1.6   Urinalysis    Component Value Date/Time   COLORURINE YELLOW 07/10/2017 2115   APPEARANCEUR HAZY (A) 07/10/2017 2115   LABSPEC >1.046 (H) 07/10/2017 2115   PHURINE 5.0 07/10/2017 2115   GLUCOSEU NEGATIVE 07/10/2017 2115   HGBUR MODERATE (A) 07/10/2017 2115   BILIRUBINUR NEGATIVE 07/10/2017 2115   KETONESUR 80 (A) 07/10/2017 2115   PROTEINUR NEGATIVE 07/10/2017 2115   NITRITE NEGATIVE 07/10/2017 2115   LEUKOCYTESUR LARGE (A) 07/10/2017 2115   Sepsis Labs Invalid input(s): PROCALCITONIN,  WBC,  LACTICIDVEN Microbiology Recent Results (from  the past 240 hour(s))  Culture, blood (routine x 2)     Status: None (Preliminary result)   Collection Time: 07/10/17 11:10 AM  Result Value Ref Range Status   Specimen Description   Final    BLOOD RIGHT ANTECUBITAL Performed at [facility], [place] [address], [place], [place] [address]    Special Requests   Final    BOTTLES DRAWN AEROBIC  AND ANAEROBIC Blood Culture adequate volume Performed at [facility] [address] [place], [place] [address]    Culture   Final    NO GROWTH 4 DAYS Performed at [facility], [place] [address], [place], [place] [address]    Report Status PENDING  Incomplete  Gastrointestinal Panel by PCR , Stool     Status: None   Collection Time: 07/10/17  2:21 PM  Result Value Ref Range Status   Campylobacter species NOT DETECTED NOT DETECTED Final   Plesimonas shigelloides NOT DETECTED NOT DETECTED Final   Salmonella species NOT DETECTED NOT DETECTED Final   Yersinia enterocolitica NOT DETECTED NOT DETECTED Final   Vibrio species NOT DETECTED NOT DETECTED Final   Vibrio cholerae NOT DETECTED NOT DETECTED Final   Enteroaggregative E coli (EAEC) NOT DETECTED NOT DETECTED Final   Enteropathogenic E coli (EPEC) NOT DETECTED NOT DETECTED Final   Enterotoxigenic E coli (ETEC) NOT DETECTED NOT DETECTED Final   Shiga like toxin producing E coli (STEC) NOT DETECTED NOT DETECTED Final   Shigella/Enteroinvasive E coli (EIEC) NOT DETECTED NOT DETECTED Final   Cryptosporidium NOT DETECTED NOT DETECTED Final   Cyclospora cayetanensis NOT DETECTED NOT DETECTED Final   Entamoeba histolytica NOT DETECTED NOT DETECTED Final   Giardia lamblia NOT DETECTED NOT DETECTED Final   Adenovirus F40/41 NOT DETECTED NOT DETECTED Final   Astrovirus NOT DETECTED NOT DETECTED Final   Norovirus GI/GII NOT DETECTED NOT DETECTED Final   Rotavirus A NOT DETECTED NOT DETECTED Final   Sapovirus (I, II, IV, and V) NOT DETECTED NOT DETECTED Final     Comment: Performed at [facility], [address] [place], [place] [address]  Culture, blood (routine x 2)     Status: None (Preliminary result)   Collection Time: 07/10/17  5:40 PM  Result Value Ref Range Status   Specimen Description   Final    BLOOD RIGHT HAND Performed at [facility], [place] [address], [place], [place] [address]    Special Requests   Final    BOTTLES DRAWN AEROBIC ONLY Blood Culture results may not be optimal due to an inadequate volume of blood received in culture bottles Performed at [place] [address] [place], [place] [address]    Culture   Final    NO GROWTH 4 DAYS Performed at [facility], [place] [address], [place], [place] [address]    Report Status PENDING  Incomplete  Urine culture     Status: Abnormal   Collection Time: 07/10/17  9:15 PM  Result Value Ref Range Status   Specimen Description   Final    URINE, RANDOM Performed at [facility] [address], [place], [place] [address]    Special Requests   Final    NONE Performed at [facility], [place] [address], [place], [place] [address]    Culture MULTIPLE SPECIES PRESENT, SUGGEST RECOLLECTION (A)  Final   Report Status 07/12/2017 FINAL  Final  C difficile quick scan w PCR reflex     Status: None   Collection Time: 07/10/17  9:54 PM  Result Value Ref Range Status   C Diff antigen NEGATIVE NEGATIVE Final   C Diff toxin NEGATIVE NEGATIVE Final   C Diff interpretation No C. difficile detected.  Final    Comment: Performed at [facility], [place] [address] [place],  [address]     Time coordinating discharge:  35 minutes  SIGNED:   Aline August, MD  [facility] 07/15/2017, 10:50 AM Pager: [phone]  If 7PM-7AM, please contact night-coverage [email or web address] Password [email or web address]

## 2017-07-15 NOTE — Progress Notes (Signed)
Patient discharge teaching given, including activity, diet, follow-up appoints, and medications. Patient verbalized understanding of all discharge instructions. IV access was d/c'd. Vitals are stable. Skin is intact except as charted in most recent assessments. Pt to be escorted out by NT, to be driven home by family. 

## 2017-07-16 ENCOUNTER — Telehealth: Payer: Self-pay | Admitting: Gastroenterology

## 2017-07-16 NOTE — Telephone Encounter (Signed)
Dr. Loletha Carrow is Doc of the Day for 07/15/17 AM.  Dr. Loletha Carrow,  Please see Epic referral. Patient is being referred from [facility] ED for diarrhea. She has now been transported to [place] home. We received paperwork from [facility] and records have been placed on your desk for review.   Thank You Colletta [place]

## 2017-07-16 NOTE — Telephone Encounter (Signed)
I reviewed the admission H+P and the discharge summary from the 5/17-5/22 hospitalization.   This is a severely chronically ill patient with advanced COPD and progressive functional decline who may have been best served by an inpatient GI consult.  She sounds far too ill for any outpatient testing and is at risk for further decline in her condition.   I strongly recommend that the nursing home staff speak with the physician supervising their facility, have them personally evaluate the patient and consider readmission to the hospital.

## 2017-07-17 NOTE — Telephone Encounter (Signed)
Nurse from nursing home returned call and was given Dr. Loletha Carrow'  Recommendation.

## 2017-07-17 NOTE — Telephone Encounter (Signed)
Spoke with Rachael Darby in transportation will have a nurse call us from [facility] home so we can notify them of Dr.Danis' recommendation.

## 2017-10-31 ENCOUNTER — Emergency Department (HOSPITAL_COMMUNITY): Payer: Medicare FFS

## 2017-10-31 ENCOUNTER — Other Ambulatory Visit: Payer: Self-pay

## 2017-10-31 ENCOUNTER — Inpatient Hospital Stay (HOSPITAL_COMMUNITY)
Admission: EM | Admit: 2017-10-31 | Discharge: 2017-11-08 | DRG: 392 | Disposition: A | Discharge status: Home Health | Payer: Medicare FFS | Attending: Family Medicine | Admitting: Family Medicine

## 2017-10-31 ENCOUNTER — Encounter (HOSPITAL_COMMUNITY): Payer: Self-pay

## 2017-10-31 DIAGNOSIS — K5900 Constipation, unspecified: Secondary | ICD-10-CM | POA: Diagnosis not present

## 2017-10-31 DIAGNOSIS — K5792 Diverticulitis of intestine, part unspecified, without perforation or abscess without bleeding: Secondary | ICD-10-CM | POA: Diagnosis not present

## 2017-10-31 DIAGNOSIS — E876 Hypokalemia: Secondary | ICD-10-CM | POA: Diagnosis present

## 2017-10-31 DIAGNOSIS — Z8249 Family history of ischemic heart disease and other diseases of the circulatory system: Secondary | ICD-10-CM | POA: Diagnosis not present

## 2017-10-31 DIAGNOSIS — D649 Anemia, unspecified: Secondary | ICD-10-CM | POA: Diagnosis present

## 2017-10-31 DIAGNOSIS — J811 Chronic pulmonary edema: Secondary | ICD-10-CM | POA: Diagnosis present

## 2017-10-31 DIAGNOSIS — Z833 Family history of diabetes mellitus: Secondary | ICD-10-CM | POA: Diagnosis not present

## 2017-10-31 DIAGNOSIS — Z87891 Personal history of nicotine dependence: Secondary | ICD-10-CM

## 2017-10-31 DIAGNOSIS — Z9981 Dependence on supplemental oxygen: Secondary | ICD-10-CM

## 2017-10-31 DIAGNOSIS — J9611 Chronic respiratory failure with hypoxia: Secondary | ICD-10-CM | POA: Diagnosis present

## 2017-10-31 DIAGNOSIS — G8929 Other chronic pain: Secondary | ICD-10-CM | POA: Diagnosis present

## 2017-10-31 DIAGNOSIS — K5732 Diverticulitis of large intestine without perforation or abscess without bleeding: Principal | ICD-10-CM | POA: Diagnosis present

## 2017-10-31 DIAGNOSIS — E860 Dehydration: Secondary | ICD-10-CM | POA: Diagnosis present

## 2017-10-31 DIAGNOSIS — K5909 Other constipation: Secondary | ICD-10-CM | POA: Diagnosis present

## 2017-10-31 DIAGNOSIS — I714 Abdominal aortic aneurysm, without rupture: Secondary | ICD-10-CM | POA: Diagnosis present

## 2017-10-31 DIAGNOSIS — I471 Supraventricular tachycardia: Secondary | ICD-10-CM | POA: Diagnosis present

## 2017-10-31 DIAGNOSIS — I5030 Unspecified diastolic (congestive) heart failure: Secondary | ICD-10-CM | POA: Diagnosis not present

## 2017-10-31 DIAGNOSIS — R0989 Other specified symptoms and signs involving the circulatory and respiratory systems: Secondary | ICD-10-CM

## 2017-10-31 DIAGNOSIS — J449 Chronic obstructive pulmonary disease, unspecified: Secondary | ICD-10-CM | POA: Diagnosis present

## 2017-10-31 DIAGNOSIS — R9431 Abnormal electrocardiogram [ECG] [EKG]: Secondary | ICD-10-CM | POA: Diagnosis not present

## 2017-10-31 LAB — URINALYSIS, ROUTINE W REFLEX MICROSCOPIC
Bilirubin Urine: NEGATIVE
Glucose, UA: NEGATIVE mg/dL
KETONES UR: NEGATIVE mg/dL
Nitrite: NEGATIVE
PH: 7 (ref 5.0–8.0)
PROTEIN: NEGATIVE mg/dL
Specific Gravity, Urine: 1.01 (ref 1.005–1.030)

## 2017-10-31 LAB — CBC WITH DIFFERENTIAL/PLATELET
Basophils Absolute: 0 10*3/uL (ref 0.0–0.1)
Basophils Relative: 0 %
EOS ABS: 0.1 10*3/uL (ref 0.0–0.7)
EOS PCT: 0 %
HCT: 32.4 % — ABNORMAL LOW (ref 36.0–46.0)
Hemoglobin: 10.5 g/dL — ABNORMAL LOW (ref 12.0–15.0)
LYMPHS ABS: 1.5 10*3/uL (ref 0.7–4.0)
Lymphocytes Relative: 13 %
MCH: 27.8 pg (ref 26.0–34.0)
MCHC: 32.4 g/dL (ref 30.0–36.0)
MCV: 85.7 fL (ref 78.0–100.0)
MONO ABS: 0.9 10*3/uL (ref 0.1–1.0)
MONOS PCT: 8 %
Neutro Abs: 8.7 10*3/uL — ABNORMAL HIGH (ref 1.7–7.7)
Neutrophils Relative %: 77 %
PLATELETS: 570 10*3/uL — AB (ref 150–400)
RBC: 3.78 MIL/uL — AB (ref 3.87–5.11)
RDW: 16.8 % — ABNORMAL HIGH (ref 11.5–15.5)
WBC: 11.2 10*3/uL — AB (ref 4.0–10.5)

## 2017-10-31 LAB — COMPREHENSIVE METABOLIC PANEL
ALBUMIN: 2.1 g/dL — AB (ref 3.5–5.0)
ALK PHOS: 48 U/L (ref 38–126)
ALT: 7 U/L (ref 0–44)
AST: 15 U/L (ref 15–41)
Anion gap: 10 (ref 5–15)
BILIRUBIN TOTAL: 0.4 mg/dL (ref 0.3–1.2)
BUN: 9 mg/dL (ref 8–23)
CALCIUM: 8.8 mg/dL — AB (ref 8.9–10.3)
CO2: 28 mmol/L (ref 22–32)
CREATININE: 0.69 mg/dL (ref 0.44–1.00)
Chloride: 100 mmol/L (ref 98–111)
GFR calc Af Amer: >60 mL/min (ref >60)
GFR calc non Af Amer: >60 mL/min (ref >60)
GLUCOSE: 97 mg/dL (ref 70–99)
Potassium: 2.7 mmol/L — CL (ref 3.5–5.1)
SODIUM: 138 mmol/L (ref 135–145)
TOTAL PROTEIN: 5.5 g/dL — AB (ref 6.5–8.1)

## 2017-10-31 LAB — MAGNESIUM: Magnesium: 2.1 mg/dL (ref 1.7–2.4)

## 2017-10-31 LAB — LIPASE, BLOOD: Lipase: 32 U/L (ref 11–51)

## 2017-10-31 LAB — TROPONIN I: Troponin I: <0.03 ng/mL (ref <0.03)

## 2017-10-31 MED ORDER — FAMOTIDINE 20 MG PO TABS
20.0000 mg | ORAL_TABLET | Freq: Every day | ORAL | Status: DC
  Administered 2017-11-01 – 2017-11-07 (×8): 20 mg via ORAL
  Filled 2017-10-31 (×8): qty 1

## 2017-10-31 MED ORDER — ONDANSETRON HCL 4 MG PO TABS: 4.0000 mg | ORAL_TABLET | Freq: Four times a day (QID) | ORAL | Status: DC | PRN

## 2017-10-31 MED ORDER — DICYCLOMINE HCL 10 MG PO CAPS
10.0000 mg | ORAL_CAPSULE | ORAL | Status: DC | PRN
  Administered 2017-11-02 – 2017-11-04 (×3): 10 mg via ORAL
  Filled 2017-10-31 (×4): qty 1

## 2017-10-31 MED ORDER — ALBUTEROL SULFATE (2.5 MG/3ML) 0.083% IN NEBU
3.0000 mL | INHALATION_SOLUTION | Freq: Every day | RESPIRATORY_TRACT | Status: DC
  Administered 2017-11-01 – 2017-11-02 (×2): 3 mL via RESPIRATORY_TRACT
  Filled 2017-10-31 (×3): qty 3

## 2017-10-31 MED ORDER — IOPAMIDOL (ISOVUE-300) INJECTION 61%
100.0000 mL | Freq: Once | INTRAVENOUS | Status: AC | PRN
  Administered 2017-10-31: 100 mL via INTRAVENOUS

## 2017-10-31 MED ORDER — SODIUM CHLORIDE 0.9 % IV BOLUS
1000.0000 mL | Freq: Once | INTRAVENOUS | Status: AC
  Administered 2017-10-31: 1000 mL via INTRAVENOUS

## 2017-10-31 MED ORDER — SENNA 8.6 MG PO TABS
1.0000 | ORAL_TABLET | Freq: Two times a day (BID) | ORAL | Status: DC
  Administered 2017-11-01 (×2): 8.6 mg via ORAL
  Filled 2017-10-31 (×2): qty 1

## 2017-10-31 MED ORDER — PIPERACILLIN-TAZOBACTAM 3.375 G IVPB 30 MIN
3.3750 g | Freq: Once | INTRAVENOUS | Status: AC
  Administered 2017-10-31: 3.375 g via INTRAVENOUS
  Filled 2017-10-31: qty 50

## 2017-10-31 MED ORDER — SODIUM CHLORIDE 0.9 % IJ SOLN
INTRAMUSCULAR | Status: AC
  Administered 2017-11-01
  Filled 2017-10-31: qty 50

## 2017-10-31 MED ORDER — ACETAMINOPHEN 325 MG PO TABS
650.0000 mg | ORAL_TABLET | Freq: Four times a day (QID) | ORAL | Status: DC | PRN
  Administered 2017-11-01 – 2017-11-08 (×10): 650 mg via ORAL
  Filled 2017-10-31 (×12): qty 2

## 2017-10-31 MED ORDER — POTASSIUM CHLORIDE 10 MEQ/100ML IV SOLN
10.0000 meq | INTRAVENOUS | Status: AC
  Administered 2017-10-31: 10 meq via INTRAVENOUS
  Filled 2017-10-31: qty 100

## 2017-10-31 MED ORDER — FENTANYL CITRATE (PF) 100 MCG/2ML IJ SOLN
50.0000 ug | Freq: Once | INTRAMUSCULAR | Status: AC
  Administered 2017-10-31: 50 ug via INTRAVENOUS
  Filled 2017-10-31: qty 2

## 2017-10-31 MED ORDER — DOCUSATE SODIUM 100 MG PO CAPS
100.0000 mg | ORAL_CAPSULE | Freq: Two times a day (BID) | ORAL | Status: DC
  Administered 2017-11-01 – 2017-11-05 (×8): 100 mg via ORAL
  Filled 2017-10-31 (×9): qty 1

## 2017-10-31 MED ORDER — ACETAMINOPHEN 650 MG RE SUPP: 650.0000 mg | Freq: Four times a day (QID) | RECTAL | Status: DC | PRN

## 2017-10-31 MED ORDER — PRAVASTATIN SODIUM 20 MG PO TABS
40.0000 mg | ORAL_TABLET | Freq: Every day | ORAL | Status: DC
  Administered 2017-11-01 – 2017-11-07 (×6): 40 mg via ORAL
  Filled 2017-10-31 (×6): qty 2

## 2017-10-31 MED ORDER — POTASSIUM CHLORIDE CRYS ER 20 MEQ PO TBCR
40.0000 meq | EXTENDED_RELEASE_TABLET | Freq: Once | ORAL | Status: AC
  Administered 2017-10-31: 40 meq via ORAL
  Filled 2017-10-31: qty 2

## 2017-10-31 MED ORDER — IOPAMIDOL (ISOVUE-300) INJECTION 61%
INTRAVENOUS | Status: AC
  Filled 2017-10-31: qty 100

## 2017-10-31 MED ORDER — ONDANSETRON HCL 4 MG/2ML IJ SOLN
4.0000 mg | Freq: Four times a day (QID) | INTRAMUSCULAR | Status: DC | PRN
  Administered 2017-11-05 – 2017-11-06 (×2): 4 mg via INTRAVENOUS
  Filled 2017-10-31 (×2): qty 2

## 2017-10-31 MED ORDER — PIPERACILLIN-TAZOBACTAM 3.375 G IVPB
3.3750 g | Freq: Three times a day (TID) | INTRAVENOUS | Status: DC
  Administered 2017-11-01 – 2017-11-08 (×22): 3.375 g via INTRAVENOUS
  Filled 2017-10-31 (×24): qty 50

## 2017-10-31 MED ORDER — BISACODYL 10 MG RE SUPP: 10.0000 mg | Freq: Every day | RECTAL | Status: DC | PRN

## 2017-10-31 MED ORDER — FENTANYL CITRATE (PF) 100 MCG/2ML IJ SOLN
25.0000 ug | INTRAMUSCULAR | Status: DC | PRN
  Administered 2017-11-02 – 2017-11-07 (×5): 25 ug via INTRAVENOUS
  Filled 2017-10-31 (×5): qty 2

## 2017-10-31 MED ORDER — ENOXAPARIN SODIUM 40 MG/0.4ML ~~LOC~~ SOLN
40.0000 mg | Freq: Every day | SUBCUTANEOUS | Status: DC
  Administered 2017-11-01 – 2017-11-07 (×7): 40 mg via SUBCUTANEOUS
  Filled 2017-10-31 (×7): qty 0.4

## 2017-10-31 NOTE — H&P (Addendum)
History and Physical    Erza Mothershead YBO:[id number] DOB: November 24, 1928 DOA: 10/31/2017  PCP: Orvis Brill, [facility]  Patient coming from: Home  I have personally briefly reviewed patient's old medical records in [place]  Chief Complaint: Abd pain, generalized weakness  HPI: Bonnee Zertuche is a 84 y.o. female with medical history significant of COPD, AAA.  Patient has had some trouble with constipation chronically.  Was doing better constipation wise while on high fiber diet.  Back in May of this year, had diverticulitis, treated medically.  Had recurrent pain last week in abdomen.  High fiber diet stopped to allow for "bowel rest", and patient put on cipro/flagyl for presumed recurrent diverticulitis.  Since then last BM was 6-7 days ago.  Completed course of cipro/flagyl but pain persists.  ED Course: CT reveals mild sigmoid diverticulitis complicated by a blind-ending fistula and large stool burden suggestive of constipation.  Started on zosyn in ED   Review of Systems: As per HPI otherwise 10 point review of systems negative.   Past Medical History:  Diagnosis Date  . Abdominal aneurysm ([place])   . Chronic diarrhea 07/10/2017  . COPD (chronic obstructive pulmonary disease) ([place])     History reviewed. No pertinent surgical history.   reports that she has quit smoking. Her smoking use included cigarettes. She has a 105.00 pack-year smoking history. She has never used smokeless tobacco. She reports that she does not drink alcohol or use drugs.  No Known Allergies  History reviewed. No pertinent family history.   Prior to Admission medications   Medication Sig Start Date End Date Taking? Authorizing Provider  albuterol (PROVENTIL HFA;VENTOLIN HFA) 108 (90 Base) MCG/ACT inhaler Inhale 2 puffs into the lungs daily.   Yes [provider]  albuterol (PROVENTIL) (2.5 MG/3ML) 0.083% nebulizer solution Take 2.5 mg by nebulization every 6 (six) hours as needed  for wheezing or shortness of breath.   Yes [provider]  dicyclomine (BENTYL) 10 MG capsule Take 10 mg by mouth as needed for pain. 10/20/17  Yes [provider]  Lactobacillus-Inulin ([place] PO) Take 1 capsule by mouth 2 (two) times daily.   Yes [provider]  lovastatin (MEVACOR) 40 MG tablet Take 40 mg by mouth at bedtime.   Yes [provider]  ranitidine (ZANTAC) 150 MG tablet Take 150 mg by mouth every evening.  08/06/17  Yes [provider]    Physical Exam: Vitals:   10/31/17 1655 10/31/17 1659 10/31/17 1704 10/31/17 1934  BP: (!) 134/113   (!) 127/45  Pulse: 89   91  Resp: 17   13  Temp: 98.7 F (37.1 C)     TempSrc: Oral     SpO2: 99% 98%  100%  Weight:   49.9 kg   Height:   5\' 4"  (1.626 m)     Constitutional: NAD, calm, comfortable Eyes: PERRL, lids and conjunctivae normal ENMT: Mucous membranes are moist. Posterior pharynx clear of any exudate or lesions.Normal dentition.  Neck: normal, supple, no masses, no thyromegaly Respiratory: clear to auscultation bilaterally, no wheezing, no crackles. Normal respiratory effort. No accessory muscle use.  Cardiovascular: Regular rate and rhythm, no murmurs / rubs / gallops. No extremity edema. 2+ pedal pulses. No carotid bruits.  Abdomen: Mild TTP RLQ, LLQ Musculoskeletal: no clubbing / cyanosis. No joint deformity upper and lower extremities. Good ROM, no contractures. Normal muscle tone.  Skin: no rashes, lesions, ulcers. No induration Neurologic: CN 2-12 grossly intact. Sensation intact, DTR  normal. Strength 5/5 in all 4.  Psychiatric: Normal judgment and insight. Alert and oriented x 3. Normal mood.    Labs on Admission: I have personally reviewed following labs and imaging studies  CBC: Recent Labs  Lab 10/31/17 1739  WBC 11.2*  NEUTROABS 8.7*  HGB 10.5*  HCT 32.4*  MCV 85.7  PLT 161*   Basic Metabolic Panel: Recent Labs  Lab 10/31/17 1739    NA 138  K 2.7*  CL 100  CO2 28  GLUCOSE 97  BUN 9  CREATININE 0.69  CALCIUM 8.8*  MG 2.1   GFR: Estimated Creatinine Clearance: 39 mL/min (by C-G formula based on SCr of 0.69 mg/dL). Liver Function Tests: Recent Labs  Lab 10/31/17 1739  AST 15  ALT 7  ALKPHOS 48  BILITOT 0.4  PROT 5.5*  ALBUMIN 2.1*   Recent Labs  Lab 10/31/17 1739  LIPASE 32   No results for input(s): AMMONIA in the last 168 hours. Coagulation Profile: No results for input(s): INR, PROTIME in the last 168 hours. Cardiac Enzymes: Recent Labs  Lab 10/31/17 1739  TROPONINI <0.03   BNP (last 3 results) No results for input(s): PROBNP in the last 8760 hours. HbA1C: No results for input(s): HGBA1C in the last 72 hours. CBG: No results for input(s): GLUCAP in the last 168 hours. Lipid Profile: No results for input(s): CHOL, HDL, LDLCALC, TRIG, CHOLHDL, LDLDIRECT in the last 72 hours. Thyroid Function Tests: No results for input(s): TSH, T4TOTAL, FREET4, T3FREE, THYROIDAB in the last 72 hours. Anemia Panel: No results for input(s): VITAMINB12, FOLATE, FERRITIN, TIBC, IRON, RETICCTPCT in the last 72 hours. Urine analysis:    Component Value Date/Time   COLORURINE YELLOW 10/31/2017 1701   APPEARANCEUR CLOUDY (A) 10/31/2017 1701   LABSPEC 1.010 10/31/2017 1701   PHURINE 7.0 10/31/2017 1701   GLUCOSEU NEGATIVE 10/31/2017 1701   HGBUR MODERATE (A) 10/31/2017 1701   BILIRUBINUR NEGATIVE 10/31/2017 1701   KETONESUR NEGATIVE 10/31/2017 1701   PROTEINUR NEGATIVE 10/31/2017 1701   NITRITE NEGATIVE 10/31/2017 1701   LEUKOCYTESUR LARGE (A) 10/31/2017 1701    Radiological Exams on Admission: Ct Abdomen Pelvis W Contrast  Result Date: 10/31/2017 CLINICAL DATA:  Lower abdominal pain.  Suspected diverticulitis. EXAM: CT ABDOMEN AND PELVIS WITH CONTRAST TECHNIQUE: Multidetector CT imaging of the abdomen and pelvis was performed using the standard protocol following bolus administration of intravenous  contrast. CONTRAST:  126mL ISOVUE-300 IOPAMIDOL (ISOVUE-300) INJECTION 61% COMPARISON:  07/10/2017 FINDINGS: Lower Chest: Small bilateral pleural effusions are mildly increased since previous study. Hepatobiliary: 2.0 cm heterogeneous low-attenuation lesion in the right hepatic lobe adjacent gallbladder fossa shows no significant change but has indeterminate characteristics. No other liver masses identified. Prior cholecystectomy. No evidence of biliary obstruction. Pancreas:  No mass or inflammatory changes. Spleen: Within normal limits in size and appearance. Adrenals/Urinary Tract: No masses identified. No evidence of hydronephrosis. Stomach/Bowel: Sigmoid diverticulosis is again demonstrated. Increased wall thickening is seen involving the mid to distal sigmoid colon, consistent with acute diverticulitis. There is also a blind-ending fistula seen along the left lateral wall of the sigmoid colon, without evidence of abscess. Large amount of stool is also seen throughout the colon. Small bowel is nondilated and unremarkable in appearance. Vascular/Lymphatic: No pathologically enlarged lymph nodes. A bilobed infrarenal abdominal aortic aneurysm is again seen which measures 4.5 cm in maximum diameter, and is not significantly changed since prior exam. Reproductive:  No mass or other significant abnormality. Other:  None. Musculoskeletal: No suspicious bone lesions identified.  Old L4 vertebral body compression fracture with prior vertebroplasty again noted. 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 No evidence of abscess. Large stool burden noted; recommend clinical correlation for possible constipation. Increased small bilateral pleural effusions. Stable bilobed infrarenal abdominal aortic aneurysm measuring 4.5 cm. Recommend followup by abdomen and pelvis CTA in 6 months, and vascular surgery referral/consultation if not already obtained. This  recommendation follows ACR consensus guidelines: White Paper of the ACR Incidental Findings Committee II on Vascular Findings. J Am Coll Radiol 2013; 10:789-794. Stable 2 cm indeterminate lesion in the right hepatic lobe. Recommend abdomen MRI without and with contrast for further evaluation if patient can cooperate with breath holding instructions. Electronically Signed   By: Earle Gell M.D.   On: 10/31/2017 20:24    EKG: Independently reviewed.  Assessment/Plan Principal Problem:   Diverticulitis of sigmoid colon Active Problems:   Constipation    1. Diverticulitis of sigmoid colon - 1. Failed outpt therapy 2. Continue IV zosyn for now 3. Complicated by blind-ending fistula but appears mild at the moment 4. Consider giving surgery a call in AM to see if they have any other recs. 2. Constipation - 1. Fairly severe 2. Scheduled senna and colace 3. PRN dulcolax enemas 4. High fiber diet 3. Hypokalemia - replace K  DVT prophylaxis: Lovenox Code Status: Full Family Communication: Family at bedside Disposition Plan: Home after admit Consults called: None Admission status: Admit to inpatient - IP status due to failed outpt therapy   Etta Quill DO [facility] Pager [phone] Only works nights!  If 7AM-7PM, please contact the primary day team physician taking care of patient  [email or web address] Password [facility]  10/31/2017, 9:43 PM

## 2017-10-31 NOTE — ED Notes (Signed)
ED TO INPATIENT HANDOFF REPORT  Name/Age/Gender Melissa Riddle 84 y.o. female  Code Status    Code Status Orders  (From admission, onward)         Start     Ordered   10/31/17 2113  Full code  Continuous     10/31/17 2116        Code Status History    Date Active Date Inactive Code Status Order ID Comments User Context   07/10/2017 1744 07/15/2017 2252 Full Code [id number]  Cristy Folks, MD Inpatient      Home/SNF/Other Home  Chief Complaint generalized weakness; abdominal pain  Level of Care/Admitting Diagnosis ED Disposition    ED Disposition Condition [facility] Area: [facility] [[id number]]  Level of Care: Med-Surg [16]  Diagnosis: Diverticulitis of sigmoid colon [342876]  Admitting Physician: Doreatha Massed  Attending Physician: Etta Quill [[id number]]  Estimated length of stay: past midnight tomorrow  Certification:: I certify this patient will need inpatient services for at least 2 midnights  PT Class (Do Not Modify): Inpatient [101]  PT Acc Code (Do Not Modify): Private [1]       Medical History Past Medical History:  Diagnosis Date  . Abdominal aneurysm ([place])   . Chronic diarrhea 07/10/2017  . COPD (chronic obstructive pulmonary disease) ([facility])     Allergies No Known Allergies  IV Location/Drains/Wounds Patient Lines/Drains/Airways Status   Active Line/Drains/Airways    Name:   Placement date:   Placement time:   Site:   Days:   Peripheral IV 10/31/17 Right Forearm   10/31/17    1741    Forearm   less than 1   Wound / Incision (Open or Dehisced)  Arm Left;Lower;Posterior   -    -    Arm             Labs/Imaging Results for orders placed or performed during the hospital encounter of 10/31/17 (from the past 48 hour(s))  Urinalysis, Routine w reflex microscopic     Status: Abnormal   Collection Time: 10/31/17  5:01 PM  Result Value Ref Range   Color, Urine YELLOW YELLOW   APPearance CLOUDY (A)  CLEAR   Specific Gravity, Urine 1.010 1.005 - 1.030   pH 7.0 5.0 - 8.0   Glucose, UA NEGATIVE NEGATIVE mg/dL   Hgb urine dipstick MODERATE (A) NEGATIVE   Bilirubin Urine NEGATIVE NEGATIVE   Ketones, ur NEGATIVE NEGATIVE mg/dL   Protein, ur NEGATIVE NEGATIVE mg/dL   Nitrite NEGATIVE NEGATIVE   Leukocytes, UA LARGE (A) NEGATIVE   RBC / HPF 11-20 0 - 5 RBC/hpf   WBC, UA 21-50 0 - 5 WBC/hpf   Bacteria, UA FEW (A) NONE SEEN   Squamous Epithelial / LPF 0-5 0 - 5   Budding Yeast PRESENT     Comment: Performed at [facility], [place] [address] [place], [place] [address]  Comprehensive metabolic panel     Status: Abnormal   Collection Time: 10/31/17  5:39 PM  Result Value Ref Range   Sodium 138 135 - 145 mmol/L   Potassium 2.7 (LL) 3.5 - 5.1 mmol/L    Comment: CRITICAL RESULT CALLED TO, READ BACK BY AND VERIFIED WITH: TAI,M AT 1916 ON 10/31/2017 BY MOSLEY,J    Chloride 100 98 - 111 mmol/L   CO2 28 22 - 32 mmol/L   Glucose, Bld 97 70 - 99 mg/dL   BUN 9 8 - 23 mg/dL  Creatinine, Ser 0.69 0.44 - 1.00 mg/dL   Calcium 8.8 (L) 8.9 - 10.3 mg/dL   Total Protein 5.5 (L) 6.5 - 8.1 g/dL   Albumin 2.1 (L) 3.5 - 5.0 g/dL   AST 15 15 - 41 U/L   ALT 7 0 - 44 U/L   Alkaline Phosphatase 48 38 - 126 U/L   Total Bilirubin 0.4 0.3 - 1.2 mg/dL   GFR calc non Af Amer >60 >60 mL/min   GFR calc Af Amer >60 >60 mL/min    Comment: (NOTE) The eGFR has been calculated using the CKD EPI equation. This calculation has not been validated in all clinical situations. eGFR's persistently <60 mL/min signify possible Chronic Kidney Disease.    Anion gap 10 5 - 15    Comment: Performed at [facility], [place] [address] [place], [place] [address]  Lipase, blood     Status: None   Collection Time: 10/31/17  5:39 PM  Result Value Ref Range   Lipase 32 11 - 51 U/L    Comment: Performed at [facility], [place] [address], [place], [place] [address]  Troponin I      Status: None   Collection Time: 10/31/17  5:39 PM  Result Value Ref Range   Troponin I <0.03 <0.03 ng/mL    Comment: Performed at [facility], [place] [address], [place], [place] [address]  CBC with Differential     Status: Abnormal   Collection Time: 10/31/17  5:39 PM  Result Value Ref Range   WBC 11.2 (H) 4.0 - 10.5 K/uL   RBC 3.78 (L) 3.87 - 5.11 MIL/uL   Hemoglobin 10.5 (L) 12.0 - 15.0 g/dL   HCT 32.4 (L) 36.0 - 46.0 %   MCV 85.7 78.0 - 100.0 fL   MCH 27.8 26.0 - 34.0 pg   MCHC 32.4 30.0 - 36.0 g/dL   RDW 16.8 (H) 11.5 - 15.5 %   Platelets 570 (H) 150 - 400 K/uL   Neutrophils Relative % 77 %   Neutro Abs 8.7 (H) 1.7 - 7.7 K/uL   Lymphocytes Relative 13 %   Lymphs Abs 1.5 0.7 - 4.0 K/uL   Monocytes Relative 8 %   Monocytes Absolute 0.9 0.1 - 1.0 K/uL   Eosinophils Relative 0 %   Eosinophils Absolute 0.1 0.0 - 0.7 K/uL   Basophils Relative 0 %   Basophils Absolute 0.0 0.0 - 0.1 K/uL    Comment: Performed at [facility], [place] [address] [place], [place] [address]  Magnesium     Status: None   Collection Time: 10/31/17  5:39 PM  Result Value Ref Range   Magnesium 2.1 1.7 - 2.4 mg/dL    Comment: Performed at [facility], [place] [address], [place],  [address]   Ct Abdomen Pelvis W Contrast  Result Date: 10/31/2017 CLINICAL DATA:  Lower abdominal pain.  Suspected diverticulitis. EXAM: CT ABDOMEN AND PELVIS WITH CONTRAST TECHNIQUE: Multidetector CT imaging of the abdomen and pelvis was performed using the standard protocol following bolus administration of intravenous contrast. CONTRAST:  175m ISOVUE-300 IOPAMIDOL (ISOVUE-300) INJECTION 61% COMPARISON:  07/10/2017 FINDINGS: Lower Chest: Small bilateral pleural effusions are mildly increased since previous study. Hepatobiliary: 2.0 cm heterogeneous low-attenuation lesion in the right hepatic lobe adjacent gallbladder fossa shows no significant change but has indeterminate  characteristics. No other liver masses identified. Prior cholecystectomy. No evidence of biliary obstruction. Pancreas:  No mass or inflammatory changes. Spleen: Within normal  limits in size and appearance. Adrenals/Urinary Tract: No masses identified. No evidence of hydronephrosis. Stomach/Bowel: Sigmoid diverticulosis is again demonstrated. Increased wall thickening is seen involving the mid to distal sigmoid colon, consistent with acute diverticulitis. There is also a blind-ending fistula seen along the left lateral wall of the sigmoid colon, without evidence of abscess. Large amount of stool is also seen throughout the colon. Small bowel is nondilated and unremarkable in appearance. Vascular/Lymphatic: No pathologically enlarged lymph nodes. A bilobed infrarenal abdominal aortic aneurysm is again seen which measures 4.5 cm in maximum diameter, and is not significantly changed since prior exam. Reproductive:  No mass or other significant abnormality. Other:  None. Musculoskeletal: No suspicious bone lesions identified. Old L4 vertebral body compression fracture with prior vertebroplasty again noted. 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 No evidence of abscess. Large stool burden noted; recommend clinical correlation for possible constipation. Increased small bilateral pleural effusions. Stable bilobed infrarenal abdominal aortic aneurysm measuring 4.5 cm. Recommend followup by abdomen and pelvis CTA in 6 months, and vascular surgery referral/consultation if not already obtained. This recommendation follows ACR consensus guidelines: White Paper of the ACR Incidental Findings Committee II on Vascular Findings. J Am Coll Radiol 2013; 10:789-794. Stable 2 cm indeterminate lesion in the right hepatic lobe. Recommend abdomen MRI without and with contrast for further evaluation if patient can cooperate with breath holding instructions. Electronically  Signed   By: Earle Gell M.D.   On: 10/31/2017 20:24    Pending Labs Unresulted Labs (From admission, onward)    Start     Ordered   11/01/17 0300  Basic metabolic panel  Tomorrow morning,   R     10/31/17 2116          Vitals/Pain Today's Vitals   10/31/17 1659 10/31/17 1704 10/31/17 1934 10/31/17 1934  BP:    (!) 127/45  Pulse:    91  Resp:    13  Temp:      TempSrc:      SpO2: 98%   100%  Weight:  49.9 kg    Height:  5' 4"  (1.626 m)    PainSc:   2      Isolation Precautions No active isolations  Medications Medications  iopamidol (ISOVUE-300) 61 % injection (has no administration in time range)  sodium chloride 0.9 % injection (has no administration in time range)  potassium chloride 10 mEq in 100 mL IVPB (10 mEq Intravenous Transfusing/Transfer 10/31/17 2147)  fentaNYL (SUBLIMAZE) injection 25 mcg (has no administration in time range)  acetaminophen (TYLENOL) tablet 650 mg (has no administration in time range)    Or  acetaminophen (TYLENOL) suppository 650 mg (has no administration in time range)  ondansetron (ZOFRAN) tablet 4 mg (has no administration in time range)    Or  ondansetron (ZOFRAN) injection 4 mg (has no administration in time range)  enoxaparin (LOVENOX) injection 40 mg (has no administration in time range)  senna (SENOKOT) tablet 8.6 mg (has no administration in time range)  bisacodyl (DULCOLAX) suppository 10 mg (has no administration in time range)  docusate sodium (COLACE) capsule 100 mg (has no administration in time range)  albuterol (PROVENTIL HFA;VENTOLIN HFA) 108 (90 Base) MCG/ACT inhaler 2 puff (has no administration in time range)  pravastatin (PRAVACHOL) tablet 40 mg (has no administration in time range)  dicyclomine (BENTYL) capsule 10 mg (has no administration in time range)  famotidine (PEPCID) tablet 20 mg (has no administration in time range)  sodium chloride 0.9 % bolus 1,000 mL (0 mLs Intravenous Stopped 10/31/17 1846)  fentaNYL  (SUBLIMAZE) injection 50 mcg (50 mcg Intravenous Given 10/31/17 1826)  potassium chloride SA (K-DUR,KLOR-CON) CR tablet 40 mEq (40 mEq Oral Given 10/31/17 1934)  iopamidol (ISOVUE-300) 61 % injection 100 mL (100 mLs Intravenous Contrast Given 10/31/17 1942)  piperacillin-tazobactam (ZOSYN) IVPB 3.375 g (0 g Intravenous Stopped 10/31/17 2134)    Mobility non-ambulatory

## 2017-10-31 NOTE — Progress Notes (Signed)
Pharmacy Antibiotic Note  Melissa Riddle is a 84 y.o. female with abdominal pain and weakness admitted on 10/31/2017 with intra-abdominal infection.  Pharmacy has been consulted for zosyn dosing.  Plan: Zosyn 3.375g IV q8h (4 hour infusion).  F/u scr/cultures  Height: 5\' 4"  (162.6 cm) Weight: 110 lb (49.9 kg) IBW/kg (Calculated) : 54.7  Temp (24hrs), Avg:98.7 F (37.1 C), Min:98.7 F (37.1 C), Max:98.7 F (37.1 C)  Recent Labs  Lab 10/31/17 1739  WBC 11.2*  CREATININE 0.69    Estimated Creatinine Clearance: 39 mL/min (by C-G formula based on SCr of 0.69 mg/dL).    No Known Allergies  Antimicrobials this admission: 9/7 zosyn >>    >>   Dose adjustments this admission:   Microbiology results:  BCx:   UCx:    Sputum:    MRSA PCR:   Thank you for allowing pharmacy to be a part of this patient's care.  Dorrene German 10/31/2017 10:14 PM

## 2017-10-31 NOTE — ED Notes (Signed)
Rica Mote, RN made aware of pt's critical K.

## 2017-10-31 NOTE — ED Triage Notes (Signed)
Per EMS:  Pt called out for generalized weakness and lower abdominal pain. Hx of diverticulitis.  Finished ABX (cipro).  Hx of COPD, GERD, HTN, and UTI's.

## 2017-10-31 NOTE — ED Provider Notes (Signed)
[place] DEPT Provider Note   CSN: [id number] Arrival date & time: 10/31/17  1640     History   Chief Complaint Chief Complaint  Patient presents with  . generalized weakness  . Abdominal Pain    HPI Melissa Riddle is a 84 y.o. female.  HPI  84 year old female brought in by EMS from home.  Chief complaint of generalized weakness and inability to get up out of the bed.  Lives at home with daughter.  She just finished a course of antibiotics for diverticulitis but since last night has started to develop recurrent left lower quadrant abdominal pain.  Pain seems to be coming and going.  No fevers, vomiting, diarrhea, constipation.  No chest pain or shortness of breath.  The weakness is diffuse and feels like she has no energy.  She has chronic dizziness when she first stands up but this is not new.  Past Medical History:  Diagnosis Date  . Abdominal aneurysm ([place])   . Chronic diarrhea 07/10/2017  . COPD (chronic obstructive pulmonary disease) [facility])     Patient Active Problem List   Diagnosis Date Noted  . Diverticulitis of sigmoid colon 10/31/2017  . Malnutrition of moderate degree 07/13/2017  . COPD (chronic obstructive pulmonary disease) ([place]) 07/10/2017  . HLD (hyperlipidemia) 07/10/2017  . Acute colitis 07/10/2017  . Chronic diarrhea 07/10/2017  . FTT (failure to thrive) in adult 07/10/2017    History reviewed. No pertinent surgical history.   OB History   None      Home Medications    Prior to Admission medications   Medication Sig Start Date End Date Taking? Authorizing Provider  albuterol (PROVENTIL HFA;VENTOLIN HFA) 108 (90 Base) MCG/ACT inhaler Inhale 2 puffs into the lungs daily.    [provider]  albuterol (PROVENTIL) (2.5 MG/3ML) 0.083% nebulizer solution Take 2.5 mg by nebulization every 6 (six) hours as needed for wheezing or shortness of breath.    [provider]  cholestyramine (QUESTRAN) 4 g  packet Take 4 g by mouth 2 (two) times daily.    [provider]  lovastatin (MEVACOR) 40 MG tablet Take 40 mg by mouth at bedtime.    [provider]  mirtazapine (REMERON) 7.5 MG tablet Take 1 tablet (7.5 mg total) by mouth daily. 07/15/17   Aline August, MD  ondansetron (ZOFRAN) 4 MG tablet Take 1 tablet (4 mg total) by mouth every 6 (six) hours as needed for nausea. 07/15/17   Aline August, MD    Family History History reviewed. No pertinent family history.  Social History Social History   Tobacco Use  . Smoking status: Former Smoker    Packs/day: 1.50    Years: 70.00    Pack years: 105.00    Types: Cigarettes  . Smokeless tobacco: Never Used  Substance Use Topics  . Alcohol use: Never    Frequency: Never  . Drug use: Never     Allergies   Patient has no known allergies.   Review of Systems Review of Systems  Constitutional: Negative for fever.  Respiratory: Negative for shortness of breath.   Cardiovascular: Negative for chest pain.  Gastrointestinal: Positive for abdominal pain. Negative for blood in stool, constipation, diarrhea and vomiting.  Genitourinary: Negative for dysuria.  Neurological: Positive for dizziness (chronic) and weakness. Negative for headaches.  All other systems reviewed and are negative.    Physical Exam Updated Vital Signs BP (!) 127/45 (BP Location: Right Arm)   Pulse 91   Temp  98.7 F (37.1 C) (Oral)   Resp 13   Ht 5\' 4"  (1.626 m)   Wt 49.9 kg   SpO2 100%   BMI 18.88 kg/m   Physical Exam  Constitutional: She is oriented to person, place, and time. She appears well-developed and well-nourished.  HENT:  Head: Normocephalic and atraumatic.  Right Ear: External ear normal.  Left Ear: External ear normal.  Nose: Nose normal.  Eyes: Right eye exhibits no discharge. Left eye exhibits no discharge.  Cardiovascular: Normal rate, regular rhythm and normal heart sounds.  Pulmonary/Chest: Effort normal and breath  sounds normal.  Abdominal: Soft. There is tenderness in the right lower quadrant, left upper quadrant and left lower quadrant.  Neurological: She is alert and oriented to person, place, and time.  Equal strength in all 4 extremities. Awake, alert, oriented to person, place and time.  Skin: Skin is warm and dry.  Nursing note and vitals reviewed.    ED Treatments / Results  Labs (all labs ordered are listed, but only abnormal results are displayed) Labs Reviewed  COMPREHENSIVE METABOLIC PANEL - Abnormal; Notable for the following components:      Result Value   Potassium 2.7 (*)    Calcium 8.8 (*)    Total Protein 5.5 (*)    Albumin 2.1 (*)    All other components within normal limits  CBC WITH DIFFERENTIAL/PLATELET - Abnormal; Notable for the following components:   WBC 11.2 (*)    RBC 3.78 (*)    Hemoglobin 10.5 (*)    HCT 32.4 (*)    RDW 16.8 (*)    Platelets 570 (*)    Neutro Abs 8.7 (*)    All other components within normal limits  URINALYSIS, ROUTINE W REFLEX MICROSCOPIC - Abnormal; Notable for the following components:   APPearance CLOUDY (*)    Hgb urine dipstick MODERATE (*)    Leukocytes, UA LARGE (*)    Bacteria, UA FEW (*)    All other components within normal limits  LIPASE, BLOOD  TROPONIN I  MAGNESIUM  BASIC METABOLIC PANEL    EKG EKG Interpretation  Date/Time:  Saturday October 31 2017 17:06:47 EDT Ventricular Rate:  80 PR Interval:    QRS Duration: 134 QT Interval:  387 QTC Calculation: 447 R Axis:   -62 Text Interpretation:  Sinus rhythm Right bundle branch block no significant change since May 2019 Confirmed by Sherwood Gambler [phone]) on 10/31/2017 5:11:26 PM   Radiology Ct Abdomen Pelvis W Contrast  Result Date: 10/31/2017 CLINICAL DATA:  Lower abdominal pain.  Suspected diverticulitis. EXAM: CT ABDOMEN AND PELVIS WITH CONTRAST TECHNIQUE: Multidetector CT imaging of the abdomen and pelvis was performed using the standard protocol following  bolus administration of intravenous contrast. CONTRAST:  128mL ISOVUE-300 IOPAMIDOL (ISOVUE-300) INJECTION 61% COMPARISON:  07/10/2017 FINDINGS: Lower Chest: Small bilateral pleural effusions are mildly increased since previous study. Hepatobiliary: 2.0 cm heterogeneous low-attenuation lesion in the right hepatic lobe adjacent gallbladder fossa shows no significant change but has indeterminate characteristics. No other liver masses identified. Prior cholecystectomy. No evidence of biliary obstruction. Pancreas:  No mass or inflammatory changes. Spleen: Within normal limits in size and appearance. Adrenals/Urinary Tract: No masses identified. No evidence of hydronephrosis. Stomach/Bowel: Sigmoid diverticulosis is again demonstrated. Increased wall thickening is seen involving the mid to distal sigmoid colon, consistent with acute diverticulitis. There is also a blind-ending fistula seen along the left lateral wall of the sigmoid colon, without evidence of abscess. Large amount of stool is also  seen throughout the colon. Small bowel is nondilated and unremarkable in appearance. Vascular/Lymphatic: No pathologically enlarged lymph nodes. A bilobed infrarenal abdominal aortic aneurysm is again seen which measures 4.5 cm in maximum diameter, and is not significantly changed since prior exam. Reproductive:  No mass or other significant abnormality. Other:  None. Musculoskeletal: No suspicious bone lesions identified. Old L4 vertebral body compression fracture with prior vertebroplasty again noted. 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 No evidence of abscess. Large stool burden noted; recommend clinical correlation for possible constipation. Increased small bilateral pleural effusions. Stable bilobed infrarenal abdominal aortic aneurysm measuring 4.5 cm. Recommend followup by abdomen and pelvis CTA in 6 months, and vascular surgery referral/consultation if  not already obtained. This recommendation follows ACR consensus guidelines: White Paper of the ACR Incidental Findings Committee II on Vascular Findings. J Am Coll Radiol 2013; 10:789-794. Stable 2 cm indeterminate lesion in the right hepatic lobe. Recommend abdomen MRI without and with contrast for further evaluation if patient can cooperate with breath holding instructions. Electronically Signed   By: Earle Gell M.D.   On: 10/31/2017 20:24    Procedures Procedures (including critical care time)  Medications Ordered in ED Medications  iopamidol (ISOVUE-300) 61 % injection (has no administration in time range)  sodium chloride 0.9 % injection (has no administration in time range)  piperacillin-tazobactam (ZOSYN) IVPB 3.375 g (3.375 g Intravenous New Bag/Given 10/31/17 2104)  potassium chloride 10 mEq in 100 mL IVPB (has no administration in time range)  fentaNYL (SUBLIMAZE) injection 25 mcg (has no administration in time range)  acetaminophen (TYLENOL) tablet 650 mg (has no administration in time range)    Or  acetaminophen (TYLENOL) suppository 650 mg (has no administration in time range)  ondansetron (ZOFRAN) tablet 4 mg (has no administration in time range)    Or  ondansetron (ZOFRAN) injection 4 mg (has no administration in time range)  enoxaparin (LOVENOX) injection 40 mg (has no administration in time range)  senna (SENOKOT) tablet 8.6 mg (has no administration in time range)  bisacodyl (DULCOLAX) suppository 10 mg (has no administration in time range)  docusate sodium (COLACE) capsule 100 mg (has no administration in time range)  sodium chloride 0.9 % bolus 1,000 mL (0 mLs Intravenous Stopped 10/31/17 1846)  fentaNYL (SUBLIMAZE) injection 50 mcg (50 mcg Intravenous Given 10/31/17 1826)  potassium chloride SA (K-DUR,KLOR-CON) CR tablet 40 mEq (40 mEq Oral Given 10/31/17 1934)  iopamidol (ISOVUE-300) 61 % injection 100 mL (100 mLs Intravenous Contrast Given 10/31/17 1942)     Initial  Impression / Assessment and Plan / ED Course  I have reviewed the triage vital signs and the nursing notes.  Pertinent labs & imaging results that were available during my care of the patient were reviewed by me and considered in my medical decision making (see chart for details).     Given she just completed a course of cipro/flagyl and has diverticulitis (though without abscess or perforation) I think she's failed outpatient antibiotics and will need admission. Given IV zosyn. Potassium will be repleted. Dr. Alcario Drought consulted for admission.  Final Clinical Impressions(s) / ED Diagnoses   Final diagnoses:  Acute diverticulitis  Hypokalemia    ED Discharge Orders    None       Sherwood Gambler, MD 10/31/17 2121

## 2017-11-01 ENCOUNTER — Other Ambulatory Visit: Payer: Self-pay

## 2017-11-01 LAB — BASIC METABOLIC PANEL
ANION GAP: 7 (ref 5–15)
BUN: 7 mg/dL — ABNORMAL LOW (ref 8–23)
CALCIUM: 8.2 mg/dL — AB (ref 8.9–10.3)
CO2: 27 mmol/L (ref 22–32)
Chloride: 105 mmol/L (ref 98–111)
Creatinine, Ser: 0.63 mg/dL (ref 0.44–1.00)
GLUCOSE: 86 mg/dL (ref 70–99)
POTASSIUM: 3.5 mmol/L (ref 3.5–5.1)
Sodium: 139 mmol/L (ref 135–145)

## 2017-11-01 NOTE — Progress Notes (Signed)
PROGRESS NOTE  Melissa Riddle:[id number] DOB: 07/04/1928 DOA: 10/31/2017 PCP: [facility]  HPI/Recap of past 24 hours: Melissa Riddle is a 84 y.o. female with medical history significant of COPD, AAA.  Patient has had some trouble with constipation chronically.  Was doing better constipation wise while on high fiber diet.  Back in May of this year, had diverticulitis, treated medically.  Had recurrent pain last week in abdomen.  High fiber diet stopped to allow for "bowel rest", and patient put on cipro/flagyl for presumed recurrent diverticulitis.  Since then last BM was 6-7 days ago. Completed course of cipro/flagyl but pain persists.  CT reveals mild sigmoid diverticulitis complicated by a blind-ending fistula and large stool burden suggestive of constipation.  11/01/2017: Patient seen and examined at bedside report persistent left lower quadrant moderate pain worse on palpation.    Assessment/Plan: Principal Problem:   Diverticulitis of sigmoid colon Active Problems:   Constipation  1. Recurrent Diverticulitis of sigmoid colon with suspicion for fistula Surgery consulted to further assess 1. Failed outpt therapy 2. Continue IV zosyn for now 3. Complicated by blind-ending fistula but appears mild at the moment 2. Constipation - 1. Fairly severe 2. Scheduled senna and colace 3. PRN dulcolax enemas 4. High fiber diet 3. Resolved Hypokalemia 4. Chronic normocytic anemia-no sign of overt bleeding.  Repeat CBC in the morning    Code Status: full  Family Communication: None at bedside  Disposition Plan: Home when clinically stable or when general surgery signs of   Consultants:  Surgery  Procedures:  None  Antimicrobials:  IV Zosyn  DVT prophylaxis: Subcu Lovenox   Objective: Vitals:   10/31/17 2145 10/31/17 2221 11/01/17 0459 11/01/17 1152  BP:  137/64 (!) 131/58   Pulse: 80 85 75   Resp: (!) 22 20 18    Temp:  98.7 F (37.1 C) 98  F (36.7 C)   TempSrc:  Oral Oral   SpO2: 100% 100% 100% 99%  Weight:  53.3 kg    Height:  5\' 4"  (1.626 m)      Intake/Output Summary (Last 24 hours) at 11/01/2017 1408 Last data filed at 11/01/2017 1012 Gross per 24 hour  Intake 1302.88 ml  Output 200 ml  Net 1102.88 ml   Filed Weights   10/31/17 1704 10/31/17 2221  Weight: 49.9 kg 53.3 kg    Exam:  . General: 84 y.o. year-old female well developed well nourished in no acute distress.  Alert and oriented x3. . Cardiovascular: Regular rate and rhythm with no rubs or gallops.  No thyromegaly or JVD noted.   Marland Kitchen Respiratory: Clear to auscultation with no wheezes or rales. Good inspiratory effort. . Abdomen: Soft LLQ tender on palpation; nondistended with normal bowel sounds x4 quadrants. . Musculoskeletal: No lower extremity edema. 2/4 pulses in all 4 extremities. . Skin: No ulcerative lesions noted or rashes, . Psychiatry: Mood is appropriate for condition and setting   Data Reviewed: CBC: Recent Labs  Lab 10/31/17 1739  WBC 11.2*  NEUTROABS 8.7*  HGB 10.5*  HCT 32.4*  MCV 85.7  PLT 263*   Basic Metabolic Panel: Recent Labs  Lab 10/31/17 1739 11/01/17 0538  NA 138 139  K 2.7* 3.5  CL 100 105  CO2 28 27  GLUCOSE 97 86  BUN 9 7*  CREATININE 0.69 0.63  CALCIUM 8.8* 8.2*  MG 2.1  --    GFR: Estimated Creatinine Clearance: 41.7 mL/min (by C-G formula based on SCr of 0.63 mg/dL). Liver  Function Tests: Recent Labs  Lab 10/31/17 1739  AST 15  ALT 7  ALKPHOS 48  BILITOT 0.4  PROT 5.5*  ALBUMIN 2.1*   Recent Labs  Lab 10/31/17 1739  LIPASE 32   No results for input(s): AMMONIA in the last 168 hours. Coagulation Profile: No results for input(s): INR, PROTIME in the last 168 hours. Cardiac Enzymes: Recent Labs  Lab 10/31/17 1739  TROPONINI <0.03   BNP (last 3 results) No results for input(s): PROBNP in the last 8760 hours. HbA1C: No results for input(s): HGBA1C in the last 72 hours. CBG: No  results for input(s): GLUCAP in the last 168 hours. Lipid Profile: No results for input(s): CHOL, HDL, LDLCALC, TRIG, CHOLHDL, LDLDIRECT in the last 72 hours. Thyroid Function Tests: No results for input(s): TSH, T4TOTAL, FREET4, T3FREE, THYROIDAB in the last 72 hours. Anemia Panel: No results for input(s): VITAMINB12, FOLATE, FERRITIN, TIBC, IRON, RETICCTPCT in the last 72 hours. Urine analysis:    Component Value Date/Time   COLORURINE YELLOW 10/31/2017 1701   APPEARANCEUR CLOUDY (A) 10/31/2017 1701   LABSPEC 1.010 10/31/2017 1701   PHURINE 7.0 10/31/2017 1701   GLUCOSEU NEGATIVE 10/31/2017 1701   HGBUR MODERATE (A) 10/31/2017 1701   BILIRUBINUR NEGATIVE 10/31/2017 1701   KETONESUR NEGATIVE 10/31/2017 1701   PROTEINUR NEGATIVE 10/31/2017 1701   NITRITE NEGATIVE 10/31/2017 1701   LEUKOCYTESUR LARGE (A) 10/31/2017 1701   Sepsis Labs: @LABRCNTIP (procalcitonin:4,lacticidven:4)  )No results found for this or any previous visit (from the past 240 hour(s)).    Studies: Ct Abdomen Pelvis W Contrast  Result Date: 10/31/2017 CLINICAL DATA:  Lower abdominal pain.  Suspected diverticulitis. EXAM: CT ABDOMEN AND PELVIS WITH CONTRAST TECHNIQUE: Multidetector CT imaging of the abdomen and pelvis was performed using the standard protocol following bolus administration of intravenous contrast. CONTRAST:  117mL ISOVUE-300 IOPAMIDOL (ISOVUE-300) INJECTION 61% COMPARISON:  07/10/2017 FINDINGS: Lower Chest: Small bilateral pleural effusions are mildly increased since previous study. Hepatobiliary: 2.0 cm heterogeneous low-attenuation lesion in the right hepatic lobe adjacent gallbladder fossa shows no significant change but has indeterminate characteristics. No other liver masses identified. Prior cholecystectomy. No evidence of biliary obstruction. Pancreas:  No mass or inflammatory changes. Spleen: Within normal limits in size and appearance. Adrenals/Urinary Tract: No masses identified. No evidence of  hydronephrosis. Stomach/Bowel: Sigmoid diverticulosis is again demonstrated. Increased wall thickening is seen involving the mid to distal sigmoid colon, consistent with acute diverticulitis. There is also a blind-ending fistula seen along the left lateral wall of the sigmoid colon, without evidence of abscess. Large amount of stool is also seen throughout the colon. Small bowel is nondilated and unremarkable in appearance. Vascular/Lymphatic: No pathologically enlarged lymph nodes. A bilobed infrarenal abdominal aortic aneurysm is again seen which measures 4.5 cm in maximum diameter, and is not significantly changed since prior exam. Reproductive:  No mass or other significant abnormality. Other:  None. Musculoskeletal: No suspicious bone lesions identified. Old L4 vertebral body compression fracture with prior vertebroplasty again noted. 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 No evidence of abscess. Large stool burden noted; recommend clinical correlation for possible constipation. Increased small bilateral pleural effusions. Stable bilobed infrarenal abdominal aortic aneurysm measuring 4.5 cm. Recommend followup by abdomen and pelvis CTA in 6 months, and vascular surgery referral/consultation if not already obtained. This recommendation follows ACR consensus guidelines: White Paper of the ACR Incidental Findings Committee II on Vascular Findings. J Am Coll Radiol 2013; 10:789-794. Stable 2 cm indeterminate lesion in  the right hepatic lobe. Recommend abdomen MRI without and with contrast for further evaluation if patient can cooperate with breath holding instructions. Electronically Signed   By: Earle Gell M.D.   On: 10/31/2017 20:24    Scheduled Meds: . albuterol  3 mL Inhalation Daily  . docusate sodium  100 mg Oral BID  . enoxaparin (LOVENOX) injection  40 mg Subcutaneous QHS  . famotidine  20 mg Oral QHS  . pravastatin  40 mg Oral q1800    . senna  1 tablet Oral BID    Continuous Infusions: . piperacillin-tazobactam (ZOSYN)  IV 3.375 g (11/01/17 0406)     LOS: 1 day     Kayleen Memos, MD [facility] Pager [phone]  If 7PM-7AM, please contact night-coverage [email or web address] Password [facility] 11/01/2017, 2:08 PM

## 2017-11-02 LAB — CBC
HEMATOCRIT: 28.8 % — AB (ref 36.0–46.0)
Hemoglobin: 9.4 g/dL — ABNORMAL LOW (ref 12.0–15.0)
MCH: 28.2 pg (ref 26.0–34.0)
MCHC: 32.6 g/dL (ref 30.0–36.0)
MCV: 86.5 fL (ref 78.0–100.0)
Platelets: 531 10*3/uL — ABNORMAL HIGH (ref 150–400)
RBC: 3.33 MIL/uL — AB (ref 3.87–5.11)
RDW: 17.1 % — ABNORMAL HIGH (ref 11.5–15.5)
WBC: 15.7 10*3/uL — AB (ref 4.0–10.5)

## 2017-11-02 LAB — COMPREHENSIVE METABOLIC PANEL
ALK PHOS: 38 U/L (ref 38–126)
ALT: 8 U/L (ref 0–44)
AST: 12 U/L — ABNORMAL LOW (ref 15–41)
Albumin: 1.9 g/dL — ABNORMAL LOW (ref 3.5–5.0)
Anion gap: 6 (ref 5–15)
BILIRUBIN TOTAL: 0.5 mg/dL (ref 0.3–1.2)
BUN: 9 mg/dL (ref 8–23)
CALCIUM: 8.4 mg/dL — AB (ref 8.9–10.3)
CO2: 29 mmol/L (ref 22–32)
CREATININE: 0.74 mg/dL (ref 0.44–1.00)
Chloride: 105 mmol/L (ref 98–111)
GFR calc Af Amer: >60 mL/min (ref >60)
GFR calc non Af Amer: >60 mL/min (ref >60)
Glucose, Bld: 99 mg/dL (ref 70–99)
Potassium: 3.8 mmol/L (ref 3.5–5.1)
Sodium: 140 mmol/L (ref 135–145)
TOTAL PROTEIN: 5.1 g/dL — AB (ref 6.5–8.1)

## 2017-11-02 LAB — LACTIC ACID, PLASMA
LACTIC ACID, VENOUS: 1.5 mmol/L (ref 0.5–1.9)
Lactic Acid, Venous: 1.3 mmol/L (ref 0.5–1.9)

## 2017-11-02 MED ORDER — SENNA 8.6 MG PO TABS
2.0000 | ORAL_TABLET | Freq: Two times a day (BID) | ORAL | Status: DC
  Administered 2017-11-02 – 2017-11-05 (×6): 17.2 mg via ORAL
  Filled 2017-11-02 (×5): qty 2

## 2017-11-02 MED ORDER — POLYETHYLENE GLYCOL 3350 17 G PO PACK
17.0000 g | PACK | Freq: Three times a day (TID) | ORAL | Status: DC
  Administered 2017-11-02 – 2017-11-03 (×3): 17 g via ORAL
  Filled 2017-11-02 (×3): qty 1

## 2017-11-02 MED ORDER — POLYETHYLENE GLYCOL 3350 17 G PO PACK: 17.0000 g | PACK | Freq: Every day | ORAL | Status: DC

## 2017-11-02 NOTE — Progress Notes (Signed)
Patient ID: Melissa Riddle, female   DOB: 12-25-1928, 84 y.o.   MRN: [id number]       Subjective: Patient states her pain is better than on admission.  She was tolerating a solid diet yesterday.    Objective: Vital signs in last 24 hours: Temp:  [98.5 F (36.9 C)] 98.5 F (36.9 C) (09/09 0438) Pulse Rate:  [93-96] 96 (09/09 0438) Resp:  [16-18] 18 (09/09 0438) BP: (119-129)/(62-65) 119/65 (09/09 0438) SpO2:  [99 %-100 %] 100 % (09/09 0438) Last BM Date: 11/01/17  Intake/Output from previous day: 09/08 0701 - 09/09 0700 In: 390.2 [P.O.:240; IV Piggyback:150.2] Out: 200 [Urine:200] Intake/Output this shift: No intake/output data recorded.  PE: Abd: soft, mild tenderness in every quadrant.  Least in the LLQ actually.  +BS, ND  Lab Results:  Recent Labs    10/31/17 1739 11/02/17 0533  WBC 11.2* 15.7*  HGB 10.5* 9.4*  HCT 32.4* 28.8*  PLT 570* 531*   BMET Recent Labs    11/01/17 0538 11/02/17 0533  NA 139 140  K 3.5 3.8  CL 105 105  CO2 27 29  GLUCOSE 86 99  BUN 7* 9  CREATININE 0.63 0.74  CALCIUM 8.2* 8.4*   PT/INR No results for input(s): LABPROT, INR in the last 72 hours. CMP     Component Value Date/Time   NA 140 11/02/2017 0533   K 3.8 11/02/2017 0533   CL 105 11/02/2017 0533   CO2 29 11/02/2017 0533   GLUCOSE 99 11/02/2017 0533   BUN 9 11/02/2017 0533   CREATININE 0.74 11/02/2017 0533   CALCIUM 8.4 (L) 11/02/2017 0533   PROT 5.1 (L) 11/02/2017 0533   ALBUMIN 1.9 (L) 11/02/2017 0533   AST 12 (L) 11/02/2017 0533   ALT 8 11/02/2017 0533   ALKPHOS 38 11/02/2017 0533   BILITOT 0.5 11/02/2017 0533   GFRNONAA >60 11/02/2017 0533   GFRAA >60 11/02/2017 0533   Lipase     Component Value Date/Time   LIPASE 32 10/31/2017 1739       Studies/Results: Ct Abdomen Pelvis W Contrast  Result Date: 10/31/2017 CLINICAL DATA:  Lower abdominal pain.  Suspected diverticulitis. EXAM: CT ABDOMEN AND PELVIS WITH CONTRAST TECHNIQUE: Multidetector CT imaging  of the abdomen and pelvis was performed using the standard protocol following bolus administration of intravenous contrast. CONTRAST:  165mL ISOVUE-300 IOPAMIDOL (ISOVUE-300) INJECTION 61% COMPARISON:  07/10/2017 FINDINGS: Lower Chest: Small bilateral pleural effusions are mildly increased since previous study. Hepatobiliary: 2.0 cm heterogeneous low-attenuation lesion in the right hepatic lobe adjacent gallbladder fossa shows no significant change but has indeterminate characteristics. No other liver masses identified. Prior cholecystectomy. No evidence of biliary obstruction. Pancreas:  No mass or inflammatory changes. Spleen: Within normal limits in size and appearance. Adrenals/Urinary Tract: No masses identified. No evidence of hydronephrosis. Stomach/Bowel: Sigmoid diverticulosis is again demonstrated. Increased wall thickening is seen involving the mid to distal sigmoid colon, consistent with acute diverticulitis. There is also a blind-ending fistula seen along the left lateral wall of the sigmoid colon, without evidence of abscess. Large amount of stool is also seen throughout the colon. Small bowel is nondilated and unremarkable in appearance. Vascular/Lymphatic: No pathologically enlarged lymph nodes. A bilobed infrarenal abdominal aortic aneurysm is again seen which measures 4.5 cm in maximum diameter, and is not significantly changed since prior exam. Reproductive:  No mass or other significant abnormality. Other:  None. Musculoskeletal: No suspicious bone lesions identified. Old L4 vertebral body compression fracture with prior vertebroplasty again noted.  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 No evidence of abscess. Large stool burden noted; recommend clinical correlation for possible constipation. Increased small bilateral pleural effusions. Stable bilobed infrarenal abdominal aortic aneurysm measuring 4.5 cm. Recommend followup by  abdomen and pelvis CTA in 6 months, and vascular surgery referral/consultation if not already obtained. This recommendation follows ACR consensus guidelines: White Paper of the ACR Incidental Findings Committee II on Vascular Findings. J Am Coll Radiol 2013; 10:789-794. Stable 2 cm indeterminate lesion in the right hepatic lobe. Recommend abdomen MRI without and with contrast for further evaluation if patient can cooperate with breath holding instructions. Electronically Signed   By: Earle Gell M.D.   On: 10/31/2017 20:24    Anti-infectives: Anti-infectives (From admission, onward)   Start     Dose/Rate Route Frequency Ordered Stop   11/01/17 0300  piperacillin-tazobactam (ZOSYN) IVPB 3.375 g     3.375 g 12.5 mL/hr over 240 Minutes Intravenous Every 8 hours 10/31/17 2216     10/31/17 2030  piperacillin-tazobactam (ZOSYN) IVPB 3.375 g     3.375 g 100 mL/hr over 30 Minutes Intravenous  Once 10/31/17 2027 10/31/17 2134       Assessment/Plan Mild diverticulitis -resume low fiber/soft diet. -WBC up today though to 15K.  Follow, cont zosyn -patient with significant constipation.  miralax added.   -patient will need to see GI as an outpatient for a colonoscopy to further evaluate her diverticular disease vs any other findings.  FEN - low fiber diet VTE - Lovenox ID - Zosyn   LOS: 2 days    Henreitta Cea , [facility] Surgery 11/02/2017, 10:27 AM Pager: [phone]

## 2017-11-02 NOTE — Progress Notes (Signed)
PROGRESS NOTE  Melissa Riddle JJH:[id number] DOB: May 10, 1928 DOA: 10/31/2017 PCP: [facility]  HPI/Recap of past 24 hours: Melissa Riddle is a 83 y.o. female with medical history significant of COPD, AAA.  Patient has had some trouble with constipation chronically.  Was doing better constipation wise while on high fiber diet.  Back in May of this year, had diverticulitis, treated medically.  Had recurrent pain last week in abdomen.  High fiber diet stopped to allow for "bowel rest", and patient put on cipro/flagyl for presumed recurrent diverticulitis.  Since then last BM was 6-7 days ago. Completed course of cipro/flagyl but pain persists.  CT reveals mild sigmoid diverticulitis complicated by a blind-ending fistula and large stool burden suggestive of constipation.  11/01/2017: Patient seen and examined at bedside report persistent left lower quadrant moderate pain worse on palpation.  11/02/2017: Patient seen and examined at her bedside.  Reports left lower quadrant abdominal pain is mild at the time of this visit while laying in the bed.  However states it recurs intermittently and it can be moderate to severe sharp pain when she moves around.  Denies nausea.  General surgery consulted and following.    Assessment/Plan: Principal Problem:   Diverticulitis of sigmoid colon Active Problems:   Constipation  1. 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 Failed outpt therapy 2. Continue IV zosyn for now 3. Complicated by blind-ending fistula but appears mild at the moment 2. Chronic constipation - 1. Continue Senokot 2 tablets twice daily and MiraLAX daily  2. Continue high fiber diet 3. Resolved Hypokalemia 4. Chronic normocytic anemia-no sign of overt bleeding.  Repeat CBC in the morning 5. Leukocytosis, worsening,       Obtain blood cultures peripherally x2 and lactic acid; continue IV Zosyn;        continue to monitor fever curve.   Code Status: full  Family Communication: None at bedside  Disposition Plan: Home when clinically stable or when general surgery signs of   Consultants:  Surgery  Procedures:  None  Antimicrobials:  IV Zosyn  DVT prophylaxis: Subcu Lovenox   Objective: Vitals:   11/01/17 0459 11/01/17 1152 11/01/17 1959 11/02/17 0438  BP: (!) 131/58  129/62 119/65  Pulse: 75  93 96  Resp: 18  16 18   Temp: 98 F (36.7 C)  98.5 F (36.9 C) 98.5 F (36.9 C)  TempSrc: Oral  Oral Oral  SpO2: 100% 99% 100% 100%  Weight:      Height:        Intake/Output Summary (Last 24 hours) at 11/02/2017 1257 Last data filed at 11/02/2017 1230 Gross per 24 hour  Intake 210.17 ml  Output -  Net 210.17 ml   Filed Weights   10/31/17 1704 10/31/17 2221  Weight: 49.9 kg 53.3 kg    Exam:  . General: 84 y.o. year-old female pleasant, well-developed well-nourished in no acute distress.  Alert and oriented x3.   . Cardiovascular: Regular rate and rhythm with no rubs or gallops.  No thyromegaly or JVD noted. Marland Kitchen Respiratory: Clear to auscultation with no wheezes or rales. Good inspiratory effort. . Abdomen: Soft LLQ tender on palpation; nondistended with normal bowel sounds x4 quadrants. . Musculoskeletal: No lower extremity edema. 2/4 pulses in all 4 extremities. . Skin: No ulcerative lesions noted or rashes, . Psychiatry: Mood is appropriate for condition and setting   Data Reviewed: CBC: Recent Labs  Lab 10/31/17 1739 11/02/17 0533  WBC 11.2* 15.7*  NEUTROABS 8.7*  --   HGB 10.5* 9.4*  HCT 32.4* 28.8*  MCV 85.7 86.5  PLT 570* 440*   Basic Metabolic Panel: Recent Labs  Lab 10/31/17 1739 11/01/17 0538 11/02/17 0533  NA 138 139 140  K 2.7* 3.5 3.8  CL 100 105 105  CO2 28 27 29   GLUCOSE 97 86 99  BUN 9 7* 9  CREATININE 0.69 0.63 0.74  CALCIUM 8.8* 8.2* 8.4*  MG 2.1  --   --    GFR: Estimated Creatinine Clearance: 41.7 mL/min (by C-G  formula based on SCr of 0.74 mg/dL). Liver Function Tests: Recent Labs  Lab 10/31/17 1739 11/02/17 0533  AST 15 12*  ALT 7 8  ALKPHOS 48 38  BILITOT 0.4 0.5  PROT 5.5* 5.1*  ALBUMIN 2.1* 1.9*   Recent Labs  Lab 10/31/17 1739  LIPASE 32   No results for input(s): AMMONIA in the last 168 hours. Coagulation Profile: No results for input(s): INR, PROTIME in the last 168 hours. Cardiac Enzymes: Recent Labs  Lab 10/31/17 1739  TROPONINI <0.03   BNP (last 3 results) No results for input(s): PROBNP in the last 8760 hours. HbA1C: No results for input(s): HGBA1C in the last 72 hours. CBG: No results for input(s): GLUCAP in the last 168 hours. Lipid Profile: No results for input(s): CHOL, HDL, LDLCALC, TRIG, CHOLHDL, LDLDIRECT in the last 72 hours. Thyroid Function Tests: No results for input(s): TSH, T4TOTAL, FREET4, T3FREE, THYROIDAB in the last 72 hours. Anemia Panel: No results for input(s): VITAMINB12, FOLATE, FERRITIN, TIBC, IRON, RETICCTPCT in the last 72 hours. Urine analysis:    Component Value Date/Time   COLORURINE YELLOW 10/31/2017 1701   APPEARANCEUR CLOUDY (A) 10/31/2017 1701   LABSPEC 1.010 10/31/2017 1701   PHURINE 7.0 10/31/2017 1701   GLUCOSEU NEGATIVE 10/31/2017 1701   HGBUR MODERATE (A) 10/31/2017 1701   BILIRUBINUR NEGATIVE 10/31/2017 1701   KETONESUR NEGATIVE 10/31/2017 1701   PROTEINUR NEGATIVE 10/31/2017 1701   NITRITE NEGATIVE 10/31/2017 1701   LEUKOCYTESUR LARGE (A) 10/31/2017 1701   Sepsis Labs: @LABRCNTIP (procalcitonin:4,lacticidven:4)  )No results found for this or any previous visit (from the past 240 hour(s)).    Studies: No results found.  Scheduled Meds: . albuterol  3 mL Inhalation Daily  . docusate sodium  100 mg Oral BID  . enoxaparin (LOVENOX) injection  40 mg Subcutaneous QHS  . famotidine  20 mg Oral QHS  . polyethylene glycol  17 g Oral Daily  . pravastatin  40 mg Oral q1800  . senna  1 tablet Oral BID    Continuous  Infusions: . piperacillin-tazobactam (ZOSYN)  IV 3.375 g (11/02/17 0457)     LOS: 2 days     Kayleen Memos, MD [facility] Pager [phone]  If 7PM-7AM, please contact night-coverage [email or web address] Password [email or web address] 11/02/2017, 12:57 PM

## 2017-11-02 NOTE — Consult Note (Signed)
Reason for Consult: diverticulitis Referring Physician: Dr. Mercy Riding Miceli is an 84 y.o. female.  HPI: 84 yo female with 2 days of abdominal pain. Melissa Riddle has had pain like this once before. Melissa Riddle has been on a high fiber diet for chronic constipation. Melissa Riddle has some minor changes in her diet and then began having lower abdominal pain. The pain was much worse yesterday. Melissa Riddle has some nausea but no vomiting.  Past Medical History:  Diagnosis Date  . Abdominal aneurysm ([place])   . Chronic diarrhea 07/10/2017  . COPD (chronic obstructive pulmonary disease) ([place])     History reviewed. No pertinent surgical history.  History reviewed. No pertinent family history.  Social History:  reports that Melissa Riddle has quit smoking. Her smoking use included cigarettes. Melissa Riddle has a 105.00 pack-year smoking history. Melissa Riddle has never used smokeless tobacco. Melissa Riddle reports that Melissa Riddle does not drink alcohol or use drugs.  Allergies: No Known Allergies  Medications: I have reviewed the patient's current medications.  Ct Abdomen Pelvis W Contrast  Result Date: 10/31/2017 CLINICAL DATA:  Lower abdominal pain.  Suspected diverticulitis. EXAM: CT ABDOMEN AND PELVIS WITH CONTRAST TECHNIQUE: Multidetector CT imaging of the abdomen and pelvis was performed using the standard protocol following bolus administration of intravenous contrast. CONTRAST:  149mL ISOVUE-300 IOPAMIDOL (ISOVUE-300) INJECTION 61% COMPARISON:  07/10/2017 FINDINGS: Lower Chest: Small bilateral pleural effusions are mildly increased since previous study. Hepatobiliary: 2.0 cm heterogeneous low-attenuation lesion in the right hepatic lobe adjacent gallbladder fossa shows no significant change but has indeterminate characteristics. No other liver masses identified. Prior cholecystectomy. No evidence of biliary obstruction. Pancreas:  No mass or inflammatory changes. Spleen: Within normal limits in size and appearance. Adrenals/Urinary Tract: No masses identified. No evidence  of hydronephrosis. Stomach/Bowel: Sigmoid diverticulosis is again demonstrated. Increased wall thickening is seen involving the mid to distal sigmoid colon, consistent with acute diverticulitis. There is also a blind-ending fistula seen along the left lateral wall of the sigmoid colon, without evidence of abscess. Large amount of stool is also seen throughout the colon. Small bowel is nondilated and unremarkable in appearance. Vascular/Lymphatic: No pathologically enlarged lymph nodes. A bilobed infrarenal abdominal aortic aneurysm is again seen which measures 4.5 cm in maximum diameter, and is not significantly changed since prior exam. Reproductive:  No mass or other significant abnormality. Other:  None. Musculoskeletal: No suspicious bone lesions identified. Old L4 vertebral body compression fracture with prior vertebroplasty again noted. 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 No evidence of abscess. Large stool burden noted; recommend clinical correlation for possible constipation. Increased small bilateral pleural effusions. Stable bilobed infrarenal abdominal aortic aneurysm measuring 4.5 cm. Recommend followup by abdomen and pelvis CTA in 6 months, and vascular surgery referral/consultation if not already obtained. This recommendation follows ACR consensus guidelines: White Paper of the ACR Incidental Findings Committee II on Vascular Findings. J Am Coll Radiol 2013; 10:789-794. Stable 2 cm indeterminate lesion in the right hepatic lobe. Recommend abdomen MRI without and with contrast for further evaluation if patient can cooperate with breath holding instructions. Electronically Signed   By: Earle Gell M.D.   On: 10/31/2017 20:24    Review of Systems  Constitutional: Negative for chills and fever.  HENT: Negative for hearing loss.   Eyes: Negative for blurred vision and double vision.  Respiratory: Negative for cough and hemoptysis.    Cardiovascular: Negative for chest pain and palpitations.  Gastrointestinal: Positive for abdominal pain and nausea. Negative for vomiting.  Genitourinary: Negative for dysuria and urgency.  Musculoskeletal: Negative for myalgias and neck pain.  Skin: Negative for itching and rash.  Neurological: Negative for dizziness, tingling and headaches.  Endo/Heme/Allergies: Does not bruise/bleed easily.  Psychiatric/Behavioral: Negative for depression and suicidal ideas.   Blood pressure 119/65, pulse 96, temperature 98.5 F (36.9 C), temperature source Oral, resp. rate 18, height 5\' 4"  (1.626 m), weight 53.3 kg, SpO2 100 %. Physical Exam  Vitals reviewed. Constitutional: Melissa Riddle is oriented to person, place, and time. Melissa Riddle appears well-developed and well-nourished.  HENT:  Head: Normocephalic and atraumatic.  Eyes: Pupils are equal, round, and reactive to light. Conjunctivae and EOM are normal.  Neck: Normal range of motion. Neck supple.  Cardiovascular: Normal rate and regular rhythm.  Respiratory: Effort normal and breath sounds normal.  GI: Soft. Bowel sounds are normal. Melissa Riddle exhibits no distension. There is tenderness in the right lower quadrant and left lower quadrant. There is no rigidity and no guarding.  Musculoskeletal: Normal range of motion.  Neurological: Melissa Riddle is alert and oriented to person, place, and time.  Skin: Skin is warm and dry.  Psychiatric: Melissa Riddle has a normal mood and affect. Her behavior is normal.    Assessment/Plan: 84 yo female with diverticulitis. WBC 11.2. No guarding. CT reviewed, inflammation of sigmoid, no free air. -continue abx -pain control -ok for diet  Arta Bruce Rashelle Ireland 11/02/2017, 10:25 AM

## 2017-11-02 NOTE — Plan of Care (Signed)
  Problem: Pain Managment: Goal: General experience of comfort will improve Outcome: Progressing   

## 2017-11-02 NOTE — Care Management Note (Signed)
Case Management Note  Patient Details  Name: Melissa Riddle MRN: [id number] Date of Birth: [birth year]  Subjective/Objective:                  Assessment/Plan: Principal Problem:   Diverticulitis of sigmoid colon Active Problems:   Constipation  1. 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 Failed outpt therapy 2. Continue IV zosyn for now 3. Complicated by blind-ending fistula but appears mild at the moment 3. Chronic constipation - 1. Continue Senokot 2 tablets twice daily and MiraLAX daily  2. Continue high fiber diet 4. Resolved Hypokalemia 5. Chronic normocytic anemia-no sign of overt bleeding.  Repeat CBC in the morning 6. Leukocytosis, worsening,       Obtain blood cultures peripherally x2 and lactic acid; continue IV Zosyn;       continue to monitor fever curve.  Action/Plan: Will follow for progression and cm needs  Expected Discharge Date:                  Expected Discharge Plan:  Home/Self Care  In-House Referral:     Discharge planning Services     Post Acute Care Choice:    Choice offered to:     DME Arranged:    DME Agency:     HH Arranged:    HH Agency:     Status of Service:  In process, will continue to follow  If discussed at Long Length of Stay Meetings, dates discussed:    Additional Comments:  Leeroy Cha, RN 11/02/2017, 2:47 PM

## 2017-11-03 LAB — BASIC METABOLIC PANEL
Anion gap: 7 (ref 5–15)
BUN: 12 mg/dL (ref 8–23)
CO2: 28 mmol/L (ref 22–32)
CREATININE: 0.74 mg/dL (ref 0.44–1.00)
Calcium: 8.2 mg/dL — ABNORMAL LOW (ref 8.9–10.3)
Chloride: 102 mmol/L (ref 98–111)
GFR calc Af Amer: >60 mL/min (ref >60)
GFR calc non Af Amer: >60 mL/min (ref >60)
GLUCOSE: 86 mg/dL (ref 70–99)
Potassium: 3.4 mmol/L — ABNORMAL LOW (ref 3.5–5.1)
Sodium: 137 mmol/L (ref 135–145)

## 2017-11-03 LAB — CBC
HEMATOCRIT: 29.7 % — AB (ref 36.0–46.0)
Hemoglobin: 9.5 g/dL — ABNORMAL LOW (ref 12.0–15.0)
MCH: 28 pg (ref 26.0–34.0)
MCHC: 32 g/dL (ref 30.0–36.0)
MCV: 87.6 fL (ref 78.0–100.0)
Platelets: 493 10*3/uL — ABNORMAL HIGH (ref 150–400)
RBC: 3.39 MIL/uL — ABNORMAL LOW (ref 3.87–5.11)
RDW: 17 % — ABNORMAL HIGH (ref 11.5–15.5)
WBC: 15.1 10*3/uL — ABNORMAL HIGH (ref 4.0–10.5)

## 2017-11-03 MED ORDER — POTASSIUM CHLORIDE CRYS ER 20 MEQ PO TBCR
40.0000 meq | EXTENDED_RELEASE_TABLET | Freq: Once | ORAL | Status: AC
  Administered 2017-11-03: 40 meq via ORAL
  Filled 2017-11-03: qty 2

## 2017-11-03 MED ORDER — SODIUM CHLORIDE 0.9 % IV SOLN
INTRAVENOUS | Status: DC
  Administered 2017-11-03 – 2017-11-05 (×5): via INTRAVENOUS

## 2017-11-03 MED ORDER — POLYETHYLENE GLYCOL 3350 17 G PO PACK
17.0000 g | PACK | Freq: Two times a day (BID) | ORAL | Status: DC
  Administered 2017-11-04 (×2): 17 g via ORAL
  Filled 2017-11-03 (×3): qty 1

## 2017-11-03 MED ORDER — SODIUM CHLORIDE 0.9 % IV BOLUS
500.0000 mL | Freq: Once | INTRAVENOUS | Status: AC
  Administered 2017-11-03: 500 mL via INTRAVENOUS

## 2017-11-03 MED ORDER — ALBUTEROL SULFATE (2.5 MG/3ML) 0.083% IN NEBU: 3.0000 mL | INHALATION_SOLUTION | RESPIRATORY_TRACT | Status: DC | PRN

## 2017-11-03 NOTE — Progress Notes (Signed)
Patient ID: Melissa Riddle, female   DOB: [birth year], 84 y.o.   MRN: [id number]       Subjective: Patient actually looks better today and says she feels better.  She says all of her abdominal pain has resolved.  She denies BM, but when looking at her, she has had a BM in the bed unrealized.  Did have a temp overnight of 100.8  Objective: Vital signs in last 24 hours: Temp:  [98.2 F (36.8 C)-100.8 F (38.2 C)] 98.5 F (36.9 C) (09/10 0839) Pulse Rate:  [86-113] 86 (09/10 0839) Resp:  [14-18] 18 (09/10 0839) BP: (92-121)/(44-63) 104/50 (09/10 0839) SpO2:  [99 %-100 %] 100 % (09/10 0839) Last BM Date: 11/02/17  Intake/Output from previous day: 09/09 0701 - 09/10 0700 In: 292 [P.O.:120; IV Piggyback:172] Out: -  Intake/Output this shift: No intake/output data recorded.  PE: Heart: regular Lungs: CTAB Abd: soft, NT, ND. +BS  Lab Results:  Recent Labs    11/02/17 0533 11/03/17 0545  WBC 15.7* 15.1*  HGB 9.4* 9.5*  HCT 28.8* 29.7*  PLT 531* 493*   BMET Recent Labs    11/02/17 0533 11/03/17 0545  NA 140 137  K 3.8 3.4*  CL 105 102  CO2 29 28  GLUCOSE 99 86  BUN 9 12  CREATININE 0.74 0.74  CALCIUM 8.4* 8.2*   PT/INR No results for input(s): LABPROT, INR in the last 72 hours. CMP     Component Value Date/Time   NA 137 11/03/2017 0545   K 3.4 (L) 11/03/2017 0545   CL 102 11/03/2017 0545   CO2 28 11/03/2017 0545   GLUCOSE 86 11/03/2017 0545   BUN 12 11/03/2017 0545   CREATININE 0.74 11/03/2017 0545   CALCIUM 8.2 (L) 11/03/2017 0545   PROT 5.1 (L) 11/02/2017 0533   ALBUMIN 1.9 (L) 11/02/2017 0533   AST 12 (L) 11/02/2017 0533   ALT 8 11/02/2017 0533   ALKPHOS 38 11/02/2017 0533   BILITOT 0.5 11/02/2017 0533   GFRNONAA >60 11/03/2017 0545   GFRAA >60 11/03/2017 0545   Lipase     Component Value Date/Time   LIPASE 32 10/31/2017 1739       Studies/Results: No results found.  Anti-infectives: Anti-infectives (From admission, onward)   Start      Dose/Rate Route Frequency Ordered Stop   11/01/17 0300  piperacillin-tazobactam (ZOSYN) IVPB 3.375 g     3.375 g 12.5 mL/hr over 240 Minutes Intravenous Every 8 hours 10/31/17 2216     10/31/17 2030  piperacillin-tazobactam (ZOSYN) IVPB 3.375 g     3.375 g 100 mL/hr over 30 Minutes Intravenous  Once 10/31/17 2027 10/31/17 2134       Assessment/Plan Diverticulitis -low fiber/soft diet and see how she does with that today.  She states she didn't eat anything yesterday -WBC still 15K today with low grade fever overnight of 100.8.  Follow, -cont zosyn -patient with significant constipation.  miralax added, TID.   -patient will need to see GI as an outpatient for a colonoscopy to further evaluate her diverticular disease vs other possible findings if this acute flare resolves without needing surgical intervention.  FEN - low fiber diet VTE - Lovenox ID - Zosyn   LOS: 3 days    Henreitta Cea , [facility] 11/03/2017, 8:52 AM Pager: [phone]

## 2017-11-03 NOTE — Progress Notes (Signed)
PROGRESS NOTE  Melissa Riddle KGY:[id number] DOB: Jun 27, 1928 DOA: 10/31/2017 PCP: [facility]  HPI/Recap of past 24 hours: Melissa Riddle is a 84 y.o. female with medical history significant of COPD, AAA.  Patient has had some trouble with constipation chronically.  Was doing better constipation wise while on high fiber diet.  Back in May of this year, had diverticulitis, treated medically.  Had recurrent pain last week in abdomen.  High fiber diet stopped to allow for "bowel rest", and patient put on cipro/flagyl for presumed recurrent diverticulitis.  Since then last BM was 6-7 days ago. Completed course of cipro/flagyl but pain persists.  CT reveals mild sigmoid diverticulitis complicated by a blind-ending fistula and large stool burden suggestive of constipation.  11/01/2017: Patient seen and examined at bedside report persistent left lower quadrant moderate pain worse on palpation.  11/02/2017: Patient seen and examined at her bedside.  Reports left lower quadrant abdominal pain is mild at the time of this visit while laying in the bed.  However states it recurs intermittently and it can be moderate to severe sharp pain when she moves around.  Denies nausea.  General surgery consulted and following.  11/03/17: Patient seen and examined at bedside.  Had a bowel movement pasty stools.  She denies abdominal cramping or nausea.  WBC still elevated at 15 K.  Recheck WBC in the morning.    Assessment/Plan: Principal Problem:   Diverticulitis of sigmoid colon Active Problems:   Constipation  1. 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 Failed outpt therapy; WBC remains elevated at 15 K, repeat CBC in the morning 2. Continue IV Zosyn 3. Complicated by blind-ending fistula but appears mild at the moment 2. Resolving chronic constipation - 1. Had a bowel movement today 11/03/2017.  Cut down on MiraLAX to  twice daily from 3 times daily as recommended by GI  2. Continue high fiber diet 3. Hypokalemia, repleted with oral potassium 40 mEq once  4. Chronic normocytic anemia-no sign of overt bleeding.  Repeat CBC in the morning 5. Leukocytosis, worsening,       Obtain blood cultures peripherally x2 and lactic acid; continue IV Zosyn;       continue to monitor fever curve.  Blood cultures no growth to date x2.   Code Status: full  Family Communication: None at bedside  Disposition Plan: Home possibly tomorrow 11/04/2017 when clinically stable or when general surgery signed off.   Consultants:  Surgery  Procedures:  None  Antimicrobials:  IV Zosyn  DVT prophylaxis: Subcu Lovenox   Objective: Vitals:   11/03/17 0510 11/03/17 0839 11/03/17 0854 11/03/17 1504  BP: (!) 92/44 (!) 104/50 (!) 110/50 (!) 108/49  Pulse: 95 86  84  Resp: 16 18  18   Temp: 98.2 F (36.8 C) 98.5 F (36.9 C)  97.9 F (36.6 C)  TempSrc:  Oral  Oral  SpO2: 100% 100%  100%  Weight:      Height:        Intake/Output Summary (Last 24 hours) at 11/03/2017 1821 Last data filed at 11/03/2017 1500 Gross per 24 hour  Intake 651.99 ml  Output -  Net 651.99 ml   Filed Weights   10/31/17 1704 10/31/17 2221  Weight: 49.9 kg 53.3 kg    Exam:  . General: 84 y.o. year-old female pleasant, well-developed well-nourished in no acute distress.  Alert and oriented x3. . Cardiovascular: Regular rate  and rhythm with no rubs or gallops.  No JVD or thyromegaly noted. Marland Kitchen Respiratory: Clear to auscultation with no wheezes or rales. Good inspiratory effort. . Abdomen: Soft nontender nondistended normal bowel sounds x4 quadrants. . Musculoskeletal: No lower extremity edema. 2/4 pulses in all 4 extremities. . Skin: No ulcerative lesions noted or rashes, . Psychiatry: Mood is appropriate for condition and setting   Data Reviewed: CBC: Recent Labs  Lab 10/31/17 1739 11/02/17 0533 11/03/17 0545  WBC 11.2* 15.7*  15.1*  NEUTROABS 8.7*  --   --   HGB 10.5* 9.4* 9.5*  HCT 32.4* 28.8* 29.7*  MCV 85.7 86.5 87.6  PLT 570* 531* 144*   Basic Metabolic Panel: Recent Labs  Lab 10/31/17 1739 11/01/17 0538 11/02/17 0533 11/03/17 0545  NA 138 139 140 137  K 2.7* 3.5 3.8 3.4*  CL 100 105 105 102  CO2 28 27 29 28   GLUCOSE 97 86 99 86  BUN 9 7* 9 12  CREATININE 0.69 0.63 0.74 0.74  CALCIUM 8.8* 8.2* 8.4* 8.2*  MG 2.1  --   --   --    GFR: Estimated Creatinine Clearance: 41.7 mL/min (by C-G formula based on SCr of 0.74 mg/dL). Liver Function Tests: Recent Labs  Lab 10/31/17 1739 11/02/17 0533  AST 15 12*  ALT 7 8  ALKPHOS 48 38  BILITOT 0.4 0.5  PROT 5.5* 5.1*  ALBUMIN 2.1* 1.9*   Recent Labs  Lab 10/31/17 1739  LIPASE 32   No results for input(s): AMMONIA in the last 168 hours. Coagulation Profile: No results for input(s): INR, PROTIME in the last 168 hours. Cardiac Enzymes: Recent Labs  Lab 10/31/17 1739  TROPONINI <0.03   BNP (last 3 results) No results for input(s): PROBNP in the last 8760 hours. HbA1C: No results for input(s): HGBA1C in the last 72 hours. CBG: No results for input(s): GLUCAP in the last 168 hours. Lipid Profile: No results for input(s): CHOL, HDL, LDLCALC, TRIG, CHOLHDL, LDLDIRECT in the last 72 hours. Thyroid Function Tests: No results for input(s): TSH, T4TOTAL, FREET4, T3FREE, THYROIDAB in the last 72 hours. Anemia Panel: No results for input(s): VITAMINB12, FOLATE, FERRITIN, TIBC, IRON, RETICCTPCT in the last 72 hours. Urine analysis:    Component Value Date/Time   COLORURINE YELLOW 10/31/2017 1701   APPEARANCEUR CLOUDY (A) 10/31/2017 1701   LABSPEC 1.010 10/31/2017 1701   PHURINE 7.0 10/31/2017 1701   GLUCOSEU NEGATIVE 10/31/2017 1701   HGBUR MODERATE (A) 10/31/2017 1701   BILIRUBINUR NEGATIVE 10/31/2017 1701   KETONESUR NEGATIVE 10/31/2017 1701   PROTEINUR NEGATIVE 10/31/2017 1701   NITRITE NEGATIVE 10/31/2017 1701   LEUKOCYTESUR LARGE  (A) 10/31/2017 1701   Sepsis Labs: @LABRCNTIP (procalcitonin:4,lacticidven:4)  ) Recent Results (from the past 240 hour(s))  Culture, blood (routine x 2)     Status: None (Preliminary result)   Collection Time: 11/02/17  1:54 PM  Result Value Ref Range Status   Specimen Description   Final    BLOOD RIGHT HAND Performed at [facility] [place], [place] [address], [place], [place] [address]    Special Requests   Final    BOTTLES DRAWN AEROBIC ONLY Blood Culture results may not be optimal due to an inadequate volume of blood received in culture bottles Performed at [facility] [address], [place], [place] [address]    Culture   Final    NO GROWTH < 24 HOURS Performed at [facility] [address], [place], [place] [address]  Report Status PENDING  Incomplete  Culture, blood (routine x 2)     Status: None (Preliminary result)   Collection Time: 11/02/17  2:05 PM  Result Value Ref Range Status   Specimen Description   Final    BLOOD RIGHT ANTECUBITAL Performed at [facility] [address], [place], [place] [address]    Special Requests   Final    BOTTLES DRAWN AEROBIC ONLY Blood Culture results may not be optimal due to an inadequate volume of blood received in culture bottles Performed at [facility] [address], [place], [place] [address]    Culture   Final    NO GROWTH < 24 HOURS Performed at [facility] [address], [place], [place] [address]    Report Status PENDING  Incomplete      Studies: No results found.  Scheduled Meds: . docusate sodium  100 mg Oral BID  . enoxaparin (LOVENOX) injection  40 mg Subcutaneous QHS  . famotidine  20 mg Oral QHS  . polyethylene glycol  17 g Oral TID  . pravastatin  40 mg Oral q1800  . senna  2 tablet Oral BID    Continuous Infusions: . sodium chloride 75 mL/hr at 11/03/17 1458  . piperacillin-tazobactam (ZOSYN)  IV 3.375 g  (11/03/17 1457)     LOS: 3 days     Kayleen Memos, MD [facility] Pager [phone]  If 7PM-7AM, please contact night-coverage [email or web address] Password [facility] 11/03/2017, 6:21 PM

## 2017-11-03 NOTE — Progress Notes (Signed)
Pharmacy Antibiotic Note  Melissa Riddle is a 84 y.o. female with abdominal pain and weakness admitted on 10/31/2017 with intra-abdominal infection.  Pharmacy has been consulted for zosyn dosing.  11/03/2017 WBC 15.1 Scr 0.74, CrCl ~ 41.7 mls/min Tmax 100.8 BCx: NGTD   Plan: Continue Zosyn 3.375g IV Q8H infused over 4hrs. Pharmacy will sign off to note writing and follow peripherally    Height: 5\' 4"  (162.6 cm) Weight: 117 lb 8.1 oz (53.3 kg) IBW/kg (Calculated) : 54.7  Temp (24hrs), Avg:99.3 F (37.4 C), Min:98.2 F (36.8 C), Max:100.8 F (38.2 C)  Recent Labs  Lab 10/31/17 1739 11/01/17 0538 11/02/17 0533 11/02/17 1455 11/02/17 1656 11/03/17 0545  WBC 11.2*  --  15.7*  --   --  15.1*  CREATININE 0.69 0.63 0.74  --   --  0.74  LATICACIDVEN  --   --   --  1.3 1.5  --     Estimated Creatinine Clearance: 41.7 mL/min (by C-G formula based on SCr of 0.74 mg/dL).    No Known Allergies  Antimicrobials this admission: 9/7 zosyn >>    Dose adjustments this admission:   Microbiology results:  9/9 BCx: NGTD    Thank you for allowing pharmacy to be a part of this patient's care.  Dolly Rias RPh 11/03/2017, 7:44 AM Pager [phone]

## 2017-11-03 NOTE — Care Management Important Message (Signed)
Important Message  Patient Details  Name: Melissa Riddle MRN: [id number] Date of Birth: [birth year]   Medicare Important Message Given:  Yes    Kerin Salen 11/03/2017, 10:19 AMImportant Message  Patient Details  Name: Melissa Riddle MRN: [id number] Date of Birth: 16-May-1928   Medicare Important Message Given:  Yes    Kerin Salen 11/03/2017, 10:19 AM

## 2017-11-04 LAB — CBC
HEMATOCRIT: 25.3 % — AB (ref 36.0–46.0)
Hemoglobin: 8.2 g/dL — ABNORMAL LOW (ref 12.0–15.0)
MCH: 28.2 pg (ref 26.0–34.0)
MCHC: 32.4 g/dL (ref 30.0–36.0)
MCV: 86.9 fL (ref 78.0–100.0)
Platelets: 418 10*3/uL — ABNORMAL HIGH (ref 150–400)
RBC: 2.91 MIL/uL — AB (ref 3.87–5.11)
RDW: 16.8 % — AB (ref 11.5–15.5)
WBC: 13.5 10*3/uL — ABNORMAL HIGH (ref 4.0–10.5)

## 2017-11-04 LAB — BASIC METABOLIC PANEL
ANION GAP: 8 (ref 5–15)
BUN: 12 mg/dL (ref 8–23)
CO2: 23 mmol/L (ref 22–32)
Calcium: 8 mg/dL — ABNORMAL LOW (ref 8.9–10.3)
Chloride: 108 mmol/L (ref 98–111)
Creatinine, Ser: 0.65 mg/dL (ref 0.44–1.00)
GFR calc Af Amer: >60 mL/min (ref >60)
GFR calc non Af Amer: >60 mL/min (ref >60)
GLUCOSE: 79 mg/dL (ref 70–99)
POTASSIUM: 3 mmol/L — AB (ref 3.5–5.1)
Sodium: 139 mmol/L (ref 135–145)

## 2017-11-04 LAB — LACTIC ACID, PLASMA
Lactic Acid, Venous: 0.7 mmol/L (ref 0.5–1.9)
Lactic Acid, Venous: 1 mmol/L (ref 0.5–1.9)

## 2017-11-04 LAB — MAGNESIUM: Magnesium: 2 mg/dL (ref 1.7–2.4)

## 2017-11-04 MED ORDER — POTASSIUM CHLORIDE CRYS ER 20 MEQ PO TBCR
40.0000 meq | EXTENDED_RELEASE_TABLET | Freq: Once | ORAL | Status: AC
  Administered 2017-11-04: 40 meq via ORAL
  Filled 2017-11-04: qty 2

## 2017-11-04 MED ORDER — POTASSIUM CHLORIDE CRYS ER 20 MEQ PO TBCR
40.0000 meq | EXTENDED_RELEASE_TABLET | Freq: Two times a day (BID) | ORAL | Status: AC
  Administered 2017-11-04 (×2): 40 meq via ORAL
  Filled 2017-11-04 (×2): qty 2

## 2017-11-04 MED ORDER — ENSURE ENLIVE PO LIQD
237.0000 mL | Freq: Three times a day (TID) | ORAL | Status: DC
  Administered 2017-11-04 – 2017-11-08 (×7): 237 mL via ORAL

## 2017-11-04 NOTE — Progress Notes (Signed)
PROGRESS NOTE  Melissa Riddle VZC:[id number] DOB: [birth year] DOA: 10/31/2017 PCP: [facility]  HPI/Recap of past 24 hours: Melissa Riddle is a 84 y.o. female with medical history significant of COPD, AAA.  Patient has had some trouble with constipation chronically.  Was doing better constipation wise while on high fiber diet.  Back in May of this year, had diverticulitis, treated medically.  Had recurrent pain last week in abdomen.  High fiber diet stopped to allow for "bowel rest", and patient put on cipro/flagyl for presumed recurrent diverticulitis.  Since then last BM was 6-7 days ago. Completed course of cipro/flagyl but pain persists.  CT reveals mild sigmoid diverticulitis complicated by a blind-ending fistula and large stool burden suggestive of constipation.  11/01/2017: Patient seen and examined at bedside report persistent left lower quadrant moderate pain worse on palpation.  11/02/2017: Patient seen and examined at her bedside.  Reports left lower quadrant abdominal pain is mild at the time of this visit while laying in the bed.  However states it recurs intermittently and it can be moderate to severe sharp pain when she moves around.  Denies nausea.  General surgery consulted and following.  11/03/17: Patient seen and examined at bedside.  Had a bowel movement pasty stools.  She denies abdominal cramping or nausea.  WBC still elevated at 15 K.  Recheck WBC in the morning.  11/04/2017: Reports severe dull right lower quadrant abdominal pain.  No nausea.  PT evaluated and recommended SNF. CSW consulted for placement.    Assessment/Plan: Principal Problem:   Diverticulitis of sigmoid colon Active Problems:   Constipation  1. 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 Failed outpt therapy; WBC remains elevated at 15 K, repeat CBC in the morning 2. Continue IV Zosyn 3. Complicated by  blind-ending fistula but appears mild at the moment  Persistent leukocytosis WBC still elevated but trending down Obtain lactic acid Repeat CBC in the morning  2. Resolving chronic constipation - 1. Had a bowel movement today 11/03/2017.  Cut down on MiraLAX to twice daily from 3 times daily as recommended by GI  2. Continue high fiber diet  Generalized weakness/physical debility/dysfunction PT recommends SNF For precautions  3. Hypokalemia, repleted with oral potassium 40 mEq once  4. Chronic normocytic anemia-no sign of overt bleeding.  Repeat CBC in the morning   Code Status: full  Family Communication: None at bedside  Disposition Plan: Home possibly tomorrow 11/05/2017 when clinically stable..   Consultants:  Surgery  Procedures:  None  Antimicrobials:  IV Zosyn  DVT prophylaxis: Subcu Lovenox   Objective: Vitals:   11/04/17 0109 11/04/17 0116 11/04/17 0424 11/04/17 1314  BP:   (!) 105/53 (!) 129/56  Pulse:  84 89 (!) 54  Resp:   16 (!) 24  Temp:   98.6 F (37 C) 98.2 F (36.8 C)  TempSrc:   Oral   SpO2: 100%  100% 100%  Weight:      Height:        Intake/Output Summary (Last 24 hours) at 11/04/2017 1828 Last data filed at 11/04/2017 1818 Gross per 24 hour  Intake 3031.14 ml  Output 600 ml  Net 2431.14 ml   Filed Weights   10/31/17 1704 10/31/17 2221  Weight: 49.9 kg 53.3 kg    Exam:  . General: 84 y.o. year-old female well-developed well-nourished in no acute distress.  Alert and minimally interactive. Marland Kitchen  Cardiovascular: Regular rate and rhythm with no rubs or gallops.  No JVD or thyromegaly noted.   Marland Kitchen Respiratory: Clear to auscultation with no wheezes or rales. Good inspiratory effort. . Abdomen: Soft tenderness on palpation of right lower quadrant.  Normal bowel sounds x4 quadrants.  . Musculoskeletal: No lower extremity edema. 2/4 pulses in all 4 extremities. Marland Kitchen Psychiatry: Mood is appropriate for condition and setting   Data  Reviewed: CBC: Recent Labs  Lab 10/31/17 1739 11/02/17 0533 11/03/17 0545 11/04/17 0618  WBC 11.2* 15.7* 15.1* 13.5*  NEUTROABS 8.7*  --   --   --   HGB 10.5* 9.4* 9.5* 8.2*  HCT 32.4* 28.8* 29.7* 25.3*  MCV 85.7 86.5 87.6 86.9  PLT 570* 531* 493* 852*   Basic Metabolic Panel: Recent Labs  Lab 10/31/17 1739 11/01/17 0538 11/02/17 0533 11/03/17 0545 11/04/17 0618  NA 138 139 140 137 139  K 2.7* 3.5 3.8 3.4* 3.0*  CL 100 105 105 102 108  CO2 28 27 29 28 23   GLUCOSE 97 86 99 86 79  BUN 9 7* 9 12 12   CREATININE 0.69 0.63 0.74 0.74 0.65  CALCIUM 8.8* 8.2* 8.4* 8.2* 8.0*  MG 2.1  --   --   --  2.0   GFR: Estimated Creatinine Clearance: 41.7 mL/min (by C-G formula based on SCr of 0.65 mg/dL). Liver Function Tests: Recent Labs  Lab 10/31/17 1739 11/02/17 0533  AST 15 12*  ALT 7 8  ALKPHOS 48 38  BILITOT 0.4 0.5  PROT 5.5* 5.1*  ALBUMIN 2.1* 1.9*   Recent Labs  Lab 10/31/17 1739  LIPASE 32   No results for input(s): AMMONIA in the last 168 hours. Coagulation Profile: No results for input(s): INR, PROTIME in the last 168 hours. Cardiac Enzymes: Recent Labs  Lab 10/31/17 1739  TROPONINI <0.03   BNP (last 3 results) No results for input(s): PROBNP in the last 8760 hours. HbA1C: No results for input(s): HGBA1C in the last 72 hours. CBG: No results for input(s): GLUCAP in the last 168 hours. Lipid Profile: No results for input(s): CHOL, HDL, LDLCALC, TRIG, CHOLHDL, LDLDIRECT in the last 72 hours. Thyroid Function Tests: No results for input(s): TSH, T4TOTAL, FREET4, T3FREE, THYROIDAB in the last 72 hours. Anemia Panel: No results for input(s): VITAMINB12, FOLATE, FERRITIN, TIBC, IRON, RETICCTPCT in the last 72 hours. Urine analysis:    Component Value Date/Time   COLORURINE YELLOW 10/31/2017 1701   APPEARANCEUR CLOUDY (A) 10/31/2017 1701   LABSPEC 1.010 10/31/2017 1701   PHURINE 7.0 10/31/2017 1701   GLUCOSEU NEGATIVE 10/31/2017 1701   HGBUR MODERATE  (A) 10/31/2017 1701   BILIRUBINUR NEGATIVE 10/31/2017 1701   KETONESUR NEGATIVE 10/31/2017 1701   PROTEINUR NEGATIVE 10/31/2017 1701   NITRITE NEGATIVE 10/31/2017 1701   LEUKOCYTESUR LARGE (A) 10/31/2017 1701   Sepsis Labs: @LABRCNTIP (procalcitonin:4,lacticidven:4)  ) Recent Results (from the past 240 hour(s))  Culture, blood (routine x 2)     Status: None (Preliminary result)   Collection Time: 11/02/17  1:54 PM  Result Value Ref Range Status   Specimen Description   Final    BLOOD RIGHT HAND Performed at [facility], [place] [address], [place], [place] [address]    Special Requests   Final    BOTTLES DRAWN AEROBIC ONLY Blood Culture results may not be optimal due to an inadequate volume of blood received in culture bottles Performed at [facility] [address] [place], [place] [address]  Culture   Final    NO GROWTH 2 DAYS Performed at [facility], [place] [address] [place], [place] [address]    Report Status PENDING  Incomplete  Culture, blood (routine x 2)     Status: None (Preliminary result)   Collection Time: 11/02/17  2:05 PM  Result Value Ref Range Status   Specimen Description   Final    BLOOD RIGHT ANTECUBITAL Performed at [facility] [address] [place], [place] [address]    Special Requests   Final    BOTTLES DRAWN AEROBIC ONLY Blood Culture results may not be optimal due to an inadequate volume of blood received in culture bottles Performed at [facility] [address] [place] [address], [place], [place] [address]    Culture   Final    NO GROWTH 2 DAYS Performed at [facility] [address] [place], [place] [address]    Report Status PENDING  Incomplete      Studies: No results found.  Scheduled Meds: . docusate sodium  100 mg Oral BID  . enoxaparin (LOVENOX) injection  40 mg Subcutaneous QHS  . famotidine  20 mg Oral QHS  . feeding supplement (ENSURE ENLIVE)  237  mL Oral TID BM  . polyethylene glycol  17 g Oral BID  . potassium chloride  40 mEq Oral BID  . pravastatin  40 mg Oral q1800  . senna  2 tablet Oral BID    Continuous Infusions: . sodium chloride 75 mL/hr at 11/04/17 1535  . piperacillin-tazobactam (ZOSYN)  IV 3.375 g (11/04/17 1347)     LOS: 4 days     Kayleen Memos, MD [facility] Pager [phone]  If 7PM-7AM, please contact night-coverage [email or web address] Password [facility] 11/04/2017, 6:28 PM

## 2017-11-04 NOTE — Evaluation (Signed)
Physical Therapy Evaluation Patient Details Name: Melissa Riddle MRN: [id number] DOB: Feb 25, 1928 Today's Date: 11/04/2017   History of Present Illness  84 yo female admitted with abd pain, weakness. Diagnosed with diverticulitis. Hx of COPD-O2 dep, chronic diarrhea, AAA  Clinical Impression  On eval, pt required Mod assist for mobility. Pt was barely able to sit EOB on today-~2 minutes before pt requested to return to supine. She required Min-Mod assist for static sitting balance during that brief time. Noted bil UE jerking also. Bowel incontinence requiring total assist for hygiene. Pt appears to be much weaker than her reported baseline. No family present during session. At this time, recommendation is for ST rehab at [facility]. Will follow and progress activity as tolerated.     Follow Up Recommendations SNF    Equipment Recommendations  None recommended by PT    Recommendations for Other Services       Precautions / Restrictions Precautions Precautions: Fall Precaution Comments: O2 dep; incontinent Restrictions Weight Bearing Restrictions: No      Mobility  Bed Mobility Overal bed mobility: Needs Assistance Bed Mobility: Rolling;Supine to Sit;Sit to Supine Rolling: Min assist   Supine to sit: Mod assist;HOB elevated Sit to supine: Mod assist;HOB elevated   General bed mobility comments: Assist for trunk and LEs. Increased time. Cues for technique  Transfers                 General transfer comment: NT-pt too weak. Noted jerking of UEs. Pt unable to sit without LOB.   Ambulation/Gait                Stairs            Wheelchair Mobility    Modified Rankin (Stroke Patients Only)       Balance Overall balance assessment: Needs assistance Sitting-balance support: Feet unsupported;Bilateral upper extremity supported Sitting balance-Leahy Scale: Poor Sitting balance - Comments: Pt unable to sit unsupported. Even with bil UE support, pt had LOB.  Min-Mod assist for static sitting balance                                     Pertinent Vitals/Pain Pain Assessment: Faces Faces Pain Scale: Hurts even more Pain Location: back Pain Descriptors / Indicators: Discomfort;Aching Pain Intervention(s): Limited activity within patient's tolerance;Repositioned    Home Living Family/patient expects to be discharged to:: Private residence Living Arrangements: Children(pt and husband are living with daughter currently) Available Help at Discharge: Family;Available 24 hours/day(husband is 84 yo.) Type of Home: House Home Access: Ramped entrance;Stairs to enter   Entrance Stairs-Number of Steps: 2 Home Layout: One level Home Equipment: Walker - 4 wheels;Bedside commode;Wheelchair - manual Additional Comments: home o2 at 3 L    Prior Function Level of Independence: Independent with assistive device(s)         Comments: Staying with daughter til she is well enough to return to [place] and her husband     Hand Dominance        Extremity/Trunk Assessment   Upper Extremity Assessment Upper Extremity Assessment: Generalized weakness    Lower Extremity Assessment Lower Extremity Assessment: Generalized weakness    Cervical / Trunk Assessment Cervical / Trunk Assessment: Kyphotic  Communication   Communication: HOH  Cognition Arousal/Alertness: Awake/alert Behavior During Therapy: WFL for tasks assessed/performed Overall Cognitive Status: Within Functional Limits for tasks assessed  General Comments      Exercises     Assessment/Plan    PT Assessment Patient needs continued PT services  PT Problem List Decreased strength;Decreased mobility;Decreased balance;Pain;Decreased activity tolerance       PT Treatment Interventions DME instruction;Gait training;Functional mobility training;Therapeutic activities;Balance training;Patient/family  education;Therapeutic exercise    PT Goals (Current goals can be found in the Care Plan section)  Acute Rehab PT Goals Patient Stated Goal: home PT Goal Formulation: With patient Time For Goal Achievement: 11/18/17 Potential to Achieve Goals: Good    Frequency Min 3X/week   Barriers to discharge        Co-evaluation               AM-PAC PT "6 Clicks" Daily Activity  Outcome Measure Difficulty turning over in bed (including adjusting bedclothes, sheets and blankets)?: A Little Difficulty moving from lying on back to sitting on the side of the bed? : Unable Difficulty sitting down on and standing up from a chair with arms (e.g., wheelchair, bedside commode, etc,.)?: Unable Help needed moving to and from a bed to chair (including a wheelchair)?: A Lot Help needed walking in hospital room?: A Lot Help needed climbing 3-5 steps with a railing? : Total 6 Click Score: 10    End of Session   Activity Tolerance: Patient limited by pain;Patient limited by fatigue Patient left: in bed;with call bell/phone within reach;with bed alarm set   PT Visit Diagnosis: Muscle weakness (generalized) (M62.81);Difficulty in walking, not elsewhere classified (R26.2);Unsteadiness on feet (R26.81)    Time: 1400-1431 PT Time Calculation (min) (ACUTE ONLY): 31 min   Charges:   PT Evaluation $PT Eval Moderate Complexity: 1 Mod PT Treatments $Therapeutic Activity: 8-22 mins          Weston Anna, PT Acute Rehabilitation Services Pager: [phone] Office: [phone]

## 2017-11-04 NOTE — Progress Notes (Signed)
Patient ID: Melissa Riddle, female   DOB: 1928-09-13, 84 y.o.   MRN: [id number]       Subjective: Patient appears to not feel as well today.  States she is having some abdominal pain again, but worse on the right than the left.  She is barely eating anything because she does not have an appetite.  Still moving her bowels.  Objective: Vital signs in last 24 hours: Temp:  [97.9 F (36.6 C)-99.6 F (37.6 C)] 98.6 F (37 C) (09/11 0424) Pulse Rate:  [84-106] 89 (09/11 0424) Resp:  [14-18] 16 (09/11 0424) BP: (105-139)/(49-68) 105/53 (09/11 0424) SpO2:  [98 %-100 %] 100 % (09/11 0424) Last BM Date: 11/03/17  Intake/Output from previous day: 09/10 0701 - 09/11 0700 In: 480 [P.O.:480] Out: 600 [Urine:600] Intake/Output this shift: Total I/O In: 120 [P.O.:120] Out: -   PE: Abd: soft, seems more tender today, but greatest on the right side of her abdomen.  Does not seem tender in LLQ. ND, +BS  Lab Results:  Recent Labs    11/03/17 0545 11/04/17 0618  WBC 15.1* 13.5*  HGB 9.5* 8.2*  HCT 29.7* 25.3*  PLT 493* 418*   BMET Recent Labs    11/03/17 0545 11/04/17 0618  NA 137 139  K 3.4* 3.0*  CL 102 108  CO2 28 23  GLUCOSE 86 79  BUN 12 12  CREATININE 0.74 0.65  CALCIUM 8.2* 8.0*   PT/INR No results for input(s): LABPROT, INR in the last 72 hours. CMP     Component Value Date/Time   NA 139 11/04/2017 0618   K 3.0 (L) 11/04/2017 0618   CL 108 11/04/2017 0618   CO2 23 11/04/2017 0618   GLUCOSE 79 11/04/2017 0618   BUN 12 11/04/2017 0618   CREATININE 0.65 11/04/2017 0618   CALCIUM 8.0 (L) 11/04/2017 0618   PROT 5.1 (L) 11/02/2017 0533   ALBUMIN 1.9 (L) 11/02/2017 0533   AST 12 (L) 11/02/2017 0533   ALT 8 11/02/2017 0533   ALKPHOS 38 11/02/2017 0533   BILITOT 0.5 11/02/2017 0533   GFRNONAA >60 11/04/2017 0618   GFRAA >60 11/04/2017 0618   Lipase     Component Value Date/Time   LIPASE 32 10/31/2017 1739       Studies/Results: No results  found.  Anti-infectives: Anti-infectives (From admission, onward)   Start     Dose/Rate Route Frequency Ordered Stop   11/01/17 0300  piperacillin-tazobactam (ZOSYN) IVPB 3.375 g     3.375 g 12.5 mL/hr over 240 Minutes Intravenous Every 8 hours 10/31/17 2216     10/31/17 2030  piperacillin-tazobactam (ZOSYN) IVPB 3.375 g     3.375 g 100 mL/hr over 30 Minutes Intravenous  Once 10/31/17 2027 10/31/17 2134       Assessment/Plan Diverticulitis -low fiber/soft diet.  Not eating much.  Will add TID ensure -WBC down to13K today with no further fevers, except LG at 99.6. -on zosyn -miralax BID for constipation -patient with more abdominal discomfort today, similar to when I saw her on Monday, but really no pain on left side of her abdomen.  Her WBC has improved and fever curve down, suggesting improvement.  Her CT scan only showed some mild diverticulitis with no other majorly concerning features at that time (not sure that the "blind end fistula" is truly real.  Difficult to tell)  -patient will need to see GI as an outpatient for a colonoscopy to further evaluate her diverticular disease vs other possible findings if this  acute flare resolves without needing surgical intervention.  FEN -low fiber diet VTE -Lovenox ID -Zosyn   LOS: 4 days    Henreitta Cea , [facility] 11/04/2017, 9:15 AM Pager: [phone]

## 2017-11-05 ENCOUNTER — Inpatient Hospital Stay (HOSPITAL_COMMUNITY): Payer: Medicare FFS

## 2017-11-05 LAB — CBC
HCT: 27.5 % — ABNORMAL LOW (ref 36.0–46.0)
HEMOGLOBIN: 8.9 g/dL — AB (ref 12.0–15.0)
MCH: 28.3 pg (ref 26.0–34.0)
MCHC: 32.4 g/dL (ref 30.0–36.0)
MCV: 87.6 fL (ref 78.0–100.0)
Platelets: 471 10*3/uL — ABNORMAL HIGH (ref 150–400)
RBC: 3.14 MIL/uL — AB (ref 3.87–5.11)
RDW: 17 % — ABNORMAL HIGH (ref 11.5–15.5)
WBC: 9.2 10*3/uL (ref 4.0–10.5)

## 2017-11-05 LAB — BASIC METABOLIC PANEL
ANION GAP: 7 (ref 5–15)
BUN: 12 mg/dL (ref 8–23)
CHLORIDE: 109 mmol/L (ref 98–111)
CO2: 23 mmol/L (ref 22–32)
CREATININE: 0.63 mg/dL (ref 0.44–1.00)
Calcium: 8 mg/dL — ABNORMAL LOW (ref 8.9–10.3)
GFR calc Af Amer: >60 mL/min (ref >60)
GFR calc non Af Amer: >60 mL/min (ref >60)
GLUCOSE: 74 mg/dL (ref 70–99)
Potassium: 4.2 mmol/L (ref 3.5–5.1)
Sodium: 139 mmol/L (ref 135–145)

## 2017-11-05 LAB — TSH: TSH: 5.167 u[IU]/mL — ABNORMAL HIGH (ref 0.350–4.500)

## 2017-11-05 MED ORDER — SODIUM CHLORIDE 0.9 % IV BOLUS
1000.0000 mL | Freq: Once | INTRAVENOUS | Status: AC
  Administered 2017-11-05: 1000 mL via INTRAVENOUS

## 2017-11-05 MED ORDER — SENNA 8.6 MG PO TABS: 2.0000 | ORAL_TABLET | Freq: Every day | ORAL | Status: DC

## 2017-11-05 MED ORDER — DICLOFENAC SODIUM 1 % TD GEL
2.0000 g | Freq: Four times a day (QID) | TRANSDERMAL | Status: DC
  Administered 2017-11-05 – 2017-11-08 (×11): 2 g via TOPICAL
  Filled 2017-11-05: qty 100

## 2017-11-05 MED ORDER — DILTIAZEM HCL-DEXTROSE 100-5 MG/100ML-% IV SOLN (PREMIX)
5.0000 mg/h | INTRAVENOUS | Status: DC
  Administered 2017-11-05: 5 mg/h via INTRAVENOUS
  Filled 2017-11-05: qty 100

## 2017-11-05 MED ORDER — POLYETHYLENE GLYCOL 3350 17 G PO PACK
17.0000 g | PACK | Freq: Every day | ORAL | Status: DC
  Administered 2017-11-08: 17 g via ORAL
  Filled 2017-11-05 (×2): qty 1

## 2017-11-05 NOTE — Progress Notes (Signed)
Pt heart rate through out the day has been running in the mid 80's -110's. Patient HR would go up to the 120-130's ST when moving or ambulating, but would quickly come back down. MD was made aware, but was informed to monitor. Patient had been moved to the chair around 1730 by physical therapy. At that time Central monitoring called and said patient HR was sustaining in the 120's-170's. This RN went into room patient was alert, no c/o of chest pain, shortness of breath.EKG was obtained.  MD was informed but no orders were given. This RN called Rapid responds RN. Rapid RN came to bedside to evaluate suggested that it looked like Afib.  MD called and patient transferred to [facility]. During transfer pt was nausea and vomited. PRN Zofran was given. Daughter was called and informed of transfer and patient condition.

## 2017-11-05 NOTE — Progress Notes (Signed)
  Late entry called to see pt at 1800 for rapid HR.  Pt found to be in A. Fib rate 120 - 160, pt denies SOB, chest pain .  B/P stable.  Dr hall notified received orders to transfer to [facility] and treat rapid HR.  Vomited x 1 , Zofran given. Family notified of transfer.

## 2017-11-05 NOTE — Progress Notes (Signed)
PROGRESS NOTE  Melissa Riddle WGN:[id number] DOB: 02/08/1929 DOA: 10/31/2017 PCP: [facility]  HPI/Recap of past 24 hours: Melissa Riddle is a 84 y.o. female with medical history significant of COPD, AAA.  Patient has had some trouble with constipation chronically.  Was doing better constipation wise while on high fiber diet.  Back in May of this year, had diverticulitis, treated medically.  Had recurrent pain last week in abdomen.  High fiber diet stopped to allow for "bowel rest", and patient put on cipro/flagyl for presumed recurrent diverticulitis.  Since then last BM was 6-7 days ago. Completed course of cipro/flagyl but pain persists.  CT reveals mild sigmoid diverticulitis complicated by a blind-ending fistula and large stool burden suggestive of constipation.  11/01/2017: Patient seen and examined at bedside report persistent left lower quadrant moderate pain worse on palpation.  11/02/2017: Patient seen and examined at her bedside.  Reports left lower quadrant abdominal pain is mild at the time of this visit while laying in the bed.  However states it recurs intermittently and it can be moderate to severe sharp pain when she moves around.  Denies nausea.  General surgery consulted and following.  11/03/17: Patient seen and examined at bedside.  Had a bowel movement pasty stools.  She denies abdominal cramping or nausea.  WBC still elevated at 15 K.  Recheck WBC in the morning.  11/04/2017: Reports severe dull right lower quadrant abdominal pain.  No nausea.  PT evaluated and recommended SNF. CSW consulted for placement.  11/05/2017: Patient seen and examined with her daughter at her bedside.  She reports severe lower back pain, dull and nonradiating appears to be at the region of her previous vertebroplasty at L4.  Voltaren gel ordered to be applied 4 times daily.  Also has very poor appetite and is not eating.  Will start gentle IV fluid hydration to avoid dehydration.     Assessment/Plan: Principal Problem:   Diverticulitis of sigmoid colon Active Problems:   Constipation  1. 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 Leukocytosis have resolved Continue IV Zosyn follow 5 out of 5 days Poor oral intake, patient encouraged to increase her protein calorie intake  Severe acute on chronic lower back pain History of vertebroplasty at L4 Voltaren gel started 4 times daily Nonradiating;  Continue to monitor  Poor oral intake/dehydration/anorexia Patient encouraged to eat and drink No specific triggers per daughter Start gentle IV fluid hydration  Monitor urine output  Resolved leukocytosis WBC still elevated but trending down Obtain lactic acid Repeat CBC in the morning       Resolving chronic constipation - Bowel movement reported on current bowel regimen, continue high-fiber  Generalized weakness/physical debility/dysfunction PT recommends SNF; patient and daughter decline SNF, okay with home health PT       Fall precautions; out of bed to chair as tolerated with every shift  2. Resolved hypokalemia, repleted with oral potassium 40 mEq once  3. Chronic normocytic anemia-no sign of overt bleeding.  Hemoglobin is stable.  Repeat CBC in the morning   Code Status: full  Family Communication: Daughter at bedside  Disposition Plan: Home possibly tomorrow 11/06/2017.  Patient daughter decline SNF.   Consultants:  Surgery  Procedures:  None  Antimicrobials:  IV Zosyn  DVT prophylaxis: Subcu Lovenox   Objective: Vitals:   11/04/17 1314 11/04/17  2018 11/05/17 0510 11/05/17 1400  BP: (!) 129/56 (!) 123/54 103/76 118/65  Pulse: (!) 54 95 84 83  Resp: (!) 24 20 17 20   Temp: 98.2 F (36.8 C) 98.7 F (37.1 C) 97.6 F (36.4 C) 97.7 F (36.5 C)  TempSrc:  Oral Oral   SpO2: 100%  99% 100% 100%  Weight:      Height:        Intake/Output Summary (Last 24 hours) at 11/05/2017 1523 Last data filed at 11/05/2017 1227 Gross per 24 hour  Intake 3091.14 ml  Output -  Net 3091.14 ml   Filed Weights   10/31/17 1704 10/31/17 2221  Weight: 49.9 kg 53.3 kg    Exam:  . General: 84 y.o. year-old female well-developed well-nourished appears uncomfortable due to severe back pain alert and minimally interactive. . Cardiovascular: Regular rate and rhythm with no rubs or gallops.  No JVD or thyromegaly noted. Marland Kitchen Respiratory: Clear to auscultation with no wheezes or rales. Good inspiratory effort. . Abdomen: Soft tenderness on palpation of right lower quadrant.  Normal bowel sounds x4 quadrants.  . Musculoskeletal: No lower extremity edema.  Trace edema affecting feet only low ankles.  2/4 pulses in all 4 extremities. Marland Kitchen Psychiatry: Mood is appropriate for condition and setting   Data Reviewed: CBC: Recent Labs  Lab 10/31/17 1739 11/02/17 0533 11/03/17 0545 11/04/17 0618 11/05/17 0529  WBC 11.2* 15.7* 15.1* 13.5* 9.2  NEUTROABS 8.7*  --   --   --   --   HGB 10.5* 9.4* 9.5* 8.2* 8.9*  HCT 32.4* 28.8* 29.7* 25.3* 27.5*  MCV 85.7 86.5 87.6 86.9 87.6  PLT 570* 531* 493* 418* 573*   Basic Metabolic Panel: Recent Labs  Lab 10/31/17 1739 11/01/17 0538 11/02/17 0533 11/03/17 0545 11/04/17 0618 11/05/17 0529  NA 138 139 140 137 139 139  K 2.7* 3.5 3.8 3.4* 3.0* 4.2  CL 100 105 105 102 108 109  CO2 28 27 29 28 23 23   GLUCOSE 97 86 99 86 79 74  BUN 9 7* 9 12 12 12   CREATININE 0.69 0.63 0.74 0.74 0.65 0.63  CALCIUM 8.8* 8.2* 8.4* 8.2* 8.0* 8.0*  MG 2.1  --   --   --  2.0  --    GFR: Estimated Creatinine Clearance: 41.7 mL/min (by C-G formula based on SCr of 0.63 mg/dL). Liver Function Tests: Recent Labs  Lab 10/31/17 1739 11/02/17 0533  AST 15 12*  ALT 7 8  ALKPHOS 48 38  BILITOT 0.4 0.5  PROT 5.5* 5.1*  ALBUMIN 2.1* 1.9*   Recent Labs  Lab  10/31/17 1739  LIPASE 32   No results for input(s): AMMONIA in the last 168 hours. Coagulation Profile: No results for input(s): INR, PROTIME in the last 168 hours. Cardiac Enzymes: Recent Labs  Lab 10/31/17 1739  TROPONINI <0.03   BNP (last 3 results) No results for input(s): PROBNP in the last 8760 hours. HbA1C: No results for input(s): HGBA1C in the last 72 hours. CBG: No results for input(s): GLUCAP in the last 168 hours. Lipid Profile: No results for input(s): CHOL, HDL, LDLCALC, TRIG, CHOLHDL, LDLDIRECT in the last 72 hours. Thyroid Function Tests: No results for input(s): TSH, T4TOTAL, FREET4, T3FREE, THYROIDAB in the last 72 hours. Anemia Panel: No results for input(s): VITAMINB12, FOLATE, FERRITIN, TIBC, IRON, RETICCTPCT in the last 72 hours. Urine analysis:    Component Value Date/Time   COLORURINE YELLOW 10/31/2017 1701   APPEARANCEUR CLOUDY (A) 10/31/2017 1701  LABSPEC 1.010 10/31/2017 1701   PHURINE 7.0 10/31/2017 1701   GLUCOSEU NEGATIVE 10/31/2017 1701   HGBUR MODERATE (A) 10/31/2017 1701   BILIRUBINUR NEGATIVE 10/31/2017 1701   KETONESUR NEGATIVE 10/31/2017 1701   PROTEINUR NEGATIVE 10/31/2017 1701   NITRITE NEGATIVE 10/31/2017 1701   LEUKOCYTESUR LARGE (A) 10/31/2017 1701   Sepsis Labs: @LABRCNTIP (procalcitonin:4,lacticidven:4)  ) Recent Results (from the past 240 hour(s))  Culture, blood (routine x 2)     Status: None (Preliminary result)   Collection Time: 11/02/17  1:54 PM  Result Value Ref Range Status   Specimen Description   Final    BLOOD RIGHT HAND Performed at [facility], [place] [address], [place], [place] [address]    Special Requests   Final    BOTTLES DRAWN AEROBIC ONLY Blood Culture results may not be optimal due to an inadequate volume of blood received in culture bottles Performed at [facility] [address], [place], [place] [address]    Culture   Final    NO GROWTH 3 DAYS Performed  at [facility], [place] [address], [place], [place] [address]    Report Status PENDING  Incomplete  Culture, blood (routine x 2)     Status: None (Preliminary result)   Collection Time: 11/02/17  2:05 PM  Result Value Ref Range Status   Specimen Description   Final    BLOOD RIGHT ANTECUBITAL Performed at [facility] [address] [place], [place] [address]    Special Requests   Final    BOTTLES DRAWN AEROBIC ONLY Blood Culture results may not be optimal due to an inadequate volume of blood received in culture bottles Performed at [facility] [address] [place], [place] [address]    Culture   Final    NO GROWTH 3 DAYS Performed at [facility], [place] [address] [place],  [address]    Report Status PENDING  Incomplete      Studies: No results found.  Scheduled Meds: . diclofenac sodium  2 g Topical QID  . docusate sodium  100 mg Oral BID  . enoxaparin (LOVENOX) injection  40 mg Subcutaneous QHS  . famotidine  20 mg Oral QHS  . feeding supplement (ENSURE ENLIVE)  237 mL Oral TID BM  . polyethylene glycol  17 g Oral BID  . pravastatin  40 mg Oral q1800  . senna  2 tablet Oral BID    Continuous Infusions: . sodium chloride 75 mL/hr at 11/05/17 1034  . piperacillin-tazobactam (ZOSYN)  IV 3.375 g (11/05/17 0544)     LOS: 5 days     Kayleen Memos, MD [facility] Pager [phone]  If 7PM-7AM, please contact night-coverage [email or web address] Password [facility] 11/05/2017, 3:23 PM

## 2017-11-05 NOTE — Progress Notes (Signed)
Physical Therapy Treatment Patient Details Name: Melissa Riddle MRN: [id number] DOB: September 10, 1928 Today's Date: 11/05/2017    History of Present Illness 84 yo female admitted with abd pain, weakness. Diagnosed with diverticulitis. Hx of COPD-O2 dep, chronic diarrhea, AAA    PT Comments    Pt with very decreased activity tolerance today. After max assist transfer to recliner today, pt with O2sats on room air at 80% and HR ranging sporadically between 101 and 161, mostly staying in 120s-140s. Pt unable to settle HR with rest for approximately 5 minutes post-transfer. Pt unable to perform complete sit to stand at this time due to lack of hip extension and LE weakness. Will continue to follow acutely and progress mobility as able.    Follow Up Recommendations  SNF     Equipment Recommendations  None recommended by PT    Recommendations for Other Services       Precautions / Restrictions Precautions Precautions: Fall Precaution Comments: O2 dep; incontinent Restrictions Weight Bearing Restrictions: No    Mobility  Bed Mobility Overal bed mobility: Needs Assistance Bed Mobility: Rolling;Supine to Sit;Sit to Supine Rolling: Mod assist   Supine to sit: Mod assist;HOB elevated Sit to supine: Mod assist;HOB elevated   General bed mobility comments: Rolling bilat x2 for cleaning BM. Verbal cuing for rolling and supine to sit technique. Mod assist for trunk elevation. Pt very unsteady sitting EOB with reports of feeling dizzy. Pt required multiple scoots of max assist by PT for placement on EOB, placement in recliner, rearranging of bed pads.   Transfers Overall transfer level: Needs assistance Equipment used: Rolling walker (2 wheeled) Transfers: Sit to/from [employer] Sit to Stand: Max assist   Squat pivot transfers: Max assist     General transfer comment: sit to stand x4, pt unable to complete hip extension and stand longer than 10 seconds. While sitting EOB  after transfer attempts, pt states "I'm peeing", and was having BM and urination simultaneously. To clean the bed, pt transferred to recliner with max assist.   Ambulation/Gait                 Stairs             Wheelchair Mobility    Modified Rankin (Stroke Patients Only)       Balance Overall balance assessment: Needs assistance Sitting-balance support: Feet unsupported;Bilateral upper extremity supported Sitting balance-Leahy Scale: Poor Sitting balance - Comments: Pt sat unsupported for a few seconds before tilting sideways, needed external PT support to right self.      Standing balance-Leahy Scale: Zero                              Cognition Arousal/Alertness: Awake/alert Behavior During Therapy: WFL for tasks assessed/performed Overall Cognitive Status: Within Functional Limits for tasks assessed                                        Exercises      General Comments        Pertinent Vitals/Pain Pain Assessment: Faces Faces Pain Scale: Hurts little more Pain Location: back, shoulders Pain Descriptors / Indicators: Discomfort;Aching;Sore Pain Intervention(s): Limited activity within patient's tolerance;Repositioned;Monitored during session    Home Living  Prior Function            PT Goals (current goals can now be found in the care plan section) Acute Rehab PT Goals Patient Stated Goal: home PT Goal Formulation: With patient Time For Goal Achievement: 11/18/17 Potential to Achieve Goals: Good Progress towards PT goals: Progressing toward goals    Frequency    Min 3X/week      PT Plan      Co-evaluation              AM-PAC PT "6 Clicks" Daily Activity  Outcome Measure  Difficulty turning over in bed (including adjusting bedclothes, sheets and blankets)?: A Little Difficulty moving from lying on back to sitting on the side of the bed? : Unable Difficulty sitting  down on and standing up from a chair with arms (e.g., wheelchair, bedside commode, etc,.)?: Unable Help needed moving to and from a bed to chair (including a wheelchair)?: A Lot Help needed walking in hospital room?: A Lot Help needed climbing 3-5 steps with a railing? : Total 6 Click Score: 10    End of Session Equipment Utilized During Treatment: (Per RN request, trialed session without O2 via [place]. Pt placed back on O2 via [place] for O2 support because after transfer to chair, pt satting at 80%. Pt bumped up to 3LO2, recovered sats to 90s after approximately 3 minutes. ) Activity Tolerance: Patient limited by pain;Patient limited by fatigue Patient left: in bed;with call bell/phone within reach;with bed alarm set   PT Visit Diagnosis: Muscle weakness (generalized) (M62.81);Difficulty in walking, not elsewhere classified (R26.2);Unsteadiness on feet (R26.81)     Time: 5726-2035 PT Time Calculation (min) (ACUTE ONLY): 48 min  Charges:  $Therapeutic Activity: 23-37 mins                     Kairen Hallinan Conception Chancy, PT, DPT  Pager # [phone]    Ralphine Hinks D Elliott Quade 11/05/2017, 5:30 PM

## 2017-11-05 NOTE — Clinical Social Work Note (Signed)
Clinical Social Work Assessment  Patient Details  Name: Melissa Riddle MRN: [id number] Date of Birth: 1929/01/09  Date of referral:  11/05/17               Reason for consult:  Facility Placement                Permission sought to share information with:    Permission granted to share information::     Name::        Agency::     Relationship::     Contact Information:     Housing/Transportation Living arrangements for the past 2 months:  Single Family Home Source of Information:  Adult Children(Daughter - Melissa Riddle) Patient Interpreter Needed:  None Criminal Activity/Legal Involvement Pertinent to Current Situation/Hospitalization:  No - Comment as needed Significant Relationships:  Adult Children Lives with:    Do you feel safe going back to the place where you live?  (PT recommending SNF) Need for family participation in patient care:  Yes (Comment)  Care giving concerns:  Patient's daughter reported no care giving concerns for patient. Patient's daughter reported that she assists patient with ADLs and has all necessary DME that patient needs. Patient's daughter reported that patient has [facility] health PT with [facility]. PT recommending SNF.   Social Worker assessment / plan:  CSW spoke with patient's daughter regarding discharge planning and PT recommendation for SNF, patient not oriented to situation and unable to participate in assessment. Patient's daughter reported that the plan is for patient to discharge home with home health services. Patient's daughter reported that patient has been to SNF multiple times in the past and that they are not interested in placing patient into a SNF again. Patient's daughter reported that patient will need PTAR to transport her back home after 5:30PM so patient's son in law can assist with patient getting into the home.   CSW updated patient's RNCM that patient is interested in home health services.  CSW signing off, no other needs identified  at this time.   Employment status:  Retired Nurse, adult PT Recommendations:  [place] / Referral to community resources:  Other (Comment Required)(HHPT referral to [facility])  Patient/Family's Response to care:  Patient's daughter appreciative of CSW phone call.  Patient/Family's Understanding of and Emotional Response to Diagnosis, Current Treatment, and Prognosis:  Patient's daughter involved in patient's care and verbalized plan for patient to discharge back home. Patient's daughter reported no concerns of caring for patient in the home.  Emotional Assessment Appearance:    Attitude/Demeanor/Rapport:  Unable to Assess Affect (typically observed):  Unable to Assess Orientation:  Oriented to Self, Oriented to Place, Oriented to  Time Alcohol / Substance use:  Not Applicable Psych involvement (Current and /or in the community):  No (Comment)  Discharge Needs  Concerns to be addressed:  Care Coordination Readmission within the last 30 days:  No Current discharge risk:  None Barriers to Discharge:  Continued Medical Work up   [employer], LCSW 11/05/2017, 3:09 PM

## 2017-11-06 ENCOUNTER — Inpatient Hospital Stay (HOSPITAL_COMMUNITY): Payer: Medicare FFS

## 2017-11-06 DIAGNOSIS — R9431 Abnormal electrocardiogram [ECG] [EKG]: Secondary | ICD-10-CM

## 2017-11-06 DIAGNOSIS — R0989 Other specified symptoms and signs involving the circulatory and respiratory systems: Secondary | ICD-10-CM

## 2017-11-06 DIAGNOSIS — I5030 Unspecified diastolic (congestive) heart failure: Secondary | ICD-10-CM

## 2017-11-06 DIAGNOSIS — I471 Supraventricular tachycardia: Secondary | ICD-10-CM

## 2017-11-06 LAB — COMPREHENSIVE METABOLIC PANEL
ALBUMIN: 1.5 g/dL — AB (ref 3.5–5.0)
ALT: 6 U/L (ref 0–44)
AST: 12 U/L — AB (ref 15–41)
Alkaline Phosphatase: 43 U/L (ref 38–126)
Anion gap: 6 (ref 5–15)
BILIRUBIN TOTAL: 0.9 mg/dL (ref 0.3–1.2)
BUN: 10 mg/dL (ref 8–23)
CO2: 23 mmol/L (ref 22–32)
Calcium: 8.1 mg/dL — ABNORMAL LOW (ref 8.9–10.3)
Chloride: 112 mmol/L — ABNORMAL HIGH (ref 98–111)
Creatinine, Ser: 0.55 mg/dL (ref 0.44–1.00)
GFR calc Af Amer: >60 mL/min (ref >60)
GFR calc non Af Amer: >60 mL/min (ref >60)
Glucose, Bld: 78 mg/dL (ref 70–99)
POTASSIUM: 4.2 mmol/L (ref 3.5–5.1)
SODIUM: 141 mmol/L (ref 135–145)
TOTAL PROTEIN: 4.4 g/dL — AB (ref 6.5–8.1)

## 2017-11-06 LAB — CBC
HCT: 27.4 % — ABNORMAL LOW (ref 36.0–46.0)
HEMATOCRIT: 27.8 % — AB (ref 36.0–46.0)
Hemoglobin: 8.6 g/dL — ABNORMAL LOW (ref 12.0–15.0)
Hemoglobin: 8.9 g/dL — ABNORMAL LOW (ref 12.0–15.0)
MCH: 27.9 pg (ref 26.0–34.0)
MCH: 28.5 pg (ref 26.0–34.0)
MCHC: 31.4 g/dL (ref 30.0–36.0)
MCHC: 32 g/dL (ref 30.0–36.0)
MCV: 89 fL (ref 78.0–100.0)
MCV: 89.1 fL (ref 78.0–100.0)
PLATELETS: 492 10*3/uL — AB (ref 150–400)
Platelets: 476 10*3/uL — ABNORMAL HIGH (ref 150–400)
RBC: 3.08 MIL/uL — AB (ref 3.87–5.11)
RBC: 3.12 MIL/uL — ABNORMAL LOW (ref 3.87–5.11)
RDW: 17.1 % — AB (ref 11.5–15.5)
RDW: 16.8 % — ABNORMAL HIGH (ref 11.5–15.5)
WBC: 8.8 10*3/uL (ref 4.0–10.5)
WBC: 14.5 10*3/uL — AB (ref 4.0–10.5)

## 2017-11-06 LAB — MAGNESIUM: Magnesium: 1.9 mg/dL (ref 1.7–2.4)

## 2017-11-06 LAB — ECHOCARDIOGRAM COMPLETE
HEIGHTINCHES: 64 in
WEIGHTICAEL: 1943.58 [oz_av]

## 2017-11-06 LAB — LACTIC ACID, PLASMA
LACTIC ACID, VENOUS: 1.5 mmol/L (ref 0.5–1.9)
Lactic Acid, Venous: 1.9 mmol/L (ref 0.5–1.9)

## 2017-11-06 LAB — PHOSPHORUS: Phosphorus: 2.3 mg/dL — ABNORMAL LOW (ref 2.5–4.6)

## 2017-11-06 LAB — MRSA PCR SCREENING: MRSA BY PCR: POSITIVE — AB

## 2017-11-06 LAB — T4, FREE: FREE T4: 1.29 ng/dL (ref 0.82–1.77)

## 2017-11-06 MED ORDER — IOPAMIDOL (ISOVUE-300) INJECTION 61%: 15.0000 mL | Freq: Once | INTRAVENOUS | Status: DC | PRN

## 2017-11-06 MED ORDER — SODIUM PHOSPHATES 45 MMOLE/15ML IV SOLN
20.0000 mmol | Freq: Once | INTRAVENOUS | Status: AC
  Administered 2017-11-06: 20 mmol via INTRAVENOUS
  Filled 2017-11-06: qty 6.67

## 2017-11-06 MED ORDER — CHLORHEXIDINE GLUCONATE CLOTH 2 % EX PADS
6.0000 | MEDICATED_PAD | Freq: Every day | CUTANEOUS | Status: DC
  Administered 2017-11-07 – 2017-11-08 (×2): 6 via TOPICAL

## 2017-11-06 MED ORDER — IOPAMIDOL (ISOVUE-300) INJECTION 61%
INTRAVENOUS | Status: AC
  Filled 2017-11-06: qty 30

## 2017-11-06 MED ORDER — MUPIROCIN 2 % EX OINT
1.0000 "application " | TOPICAL_OINTMENT | Freq: Two times a day (BID) | CUTANEOUS | Status: DC
  Administered 2017-11-06 – 2017-11-08 (×5): 1 via NASAL
  Filled 2017-11-06: qty 22

## 2017-11-06 MED ORDER — METOPROLOL TARTRATE 12.5 MG HALF TABLET
12.5000 mg | ORAL_TABLET | Freq: Two times a day (BID) | ORAL | Status: DC
  Administered 2017-11-06 – 2017-11-08 (×4): 12.5 mg via ORAL
  Filled 2017-11-06 (×5): qty 1

## 2017-11-06 MED ORDER — SODIUM CHLORIDE 0.9 % IV BOLUS: 1000.0000 mL | Freq: Once | INTRAVENOUS | Status: DC

## 2017-11-06 MED ORDER — SODIUM CHLORIDE 0.9 % IV BOLUS
500.0000 mL | Freq: Once | INTRAVENOUS | Status: AC
  Administered 2017-11-06: 500 mL via INTRAVENOUS

## 2017-11-06 MED ORDER — FOLIC ACID 1 MG PO TABS: 1.0000 mg | ORAL_TABLET | Freq: Every day | ORAL | Status: DC

## 2017-11-06 MED ORDER — SODIUM CHLORIDE 0.9 % IV BOLUS: 500.0000 mL | Freq: Once | INTRAVENOUS | Status: DC

## 2017-11-06 NOTE — Consult Note (Addendum)
Cardiology Consultation:   Patient ID: Exa Bomba; [id number]; [birth year]   Admit date: 10/31/2017 Date of Consult: 11/06/2017  Primary Care Provider: [facility] Primary Cardiologist: New to [facility] Primary Electrophysiologist:  None   Patient Profile:   Melissa Riddle is a 84 y.o. female with a PMH of COPD, AAA, diverticulitis, and chronic constipation, who is being seen today for the evaluation of possible atrial fibrillation at the request of Dr. Nevada Crane.  History of Present Illness:   Melissa Riddle presented to the ED 10/31/17 with abdominal pain. Her pain initially started a week prior to admission and had just completed a course of cipro/flagyl for diverticulitis, however pain persisted prompting her to present to the hospital. She had a CT A/P which showed mild sigmoid diverticulitis and ?blind ending fistula without evidence of abscess, and large stool burden. She was also noted to have a infrarenal AAA measuring 4.5cm and recommended for follow-up CT in 6 months. She was started on IV antibiotics. Surgery was consulted and recommended conservative management. Her symptoms improved, however on 11/05/17, she was noted to have tachycardia to the 120s-160s, thought to be atrial fibrillation. She was started on a diltiazem gtt at that time, which was discontinued around 11pm after it was felt that she was maintaining sinus rhythm.   She has no prior cardiac history, aside from HTN although she was taken off her BP medication in 04/2017 due to low blood pressures. She has never had an ischemic evaluation. She reports being asymptomatic at the time of of event last evening. She has never had the sensation of a racing heart beat or palpitations. No prior history of DM, Vascular disease, heart failure, or prior stroke. She denies prior falls, melena, hematochezia, hematemesis, or hematuria. She previously followed outpatient with a provider every 6 months for monitoring of her  AAA, however since she has been homebound (as of 04/2017), she has not done so. Daughter states she had significant functional decline in the last couple of weeks with poor appetites/strength.   Past Medical History:  Diagnosis Date  . Abdominal aneurysm ([place])   . Chronic diarrhea 07/10/2017  . COPD (chronic obstructive pulmonary disease) ([place])     History reviewed. No pertinent surgical history.   Home Medications:  Prior to Admission medications   Medication Sig Start Date End Date Taking? Authorizing Provider  albuterol (PROVENTIL HFA;VENTOLIN HFA) 108 (90 Base) MCG/ACT inhaler Inhale 2 puffs into the lungs daily.   Yes [provider]  albuterol (PROVENTIL) (2.5 MG/3ML) 0.083% nebulizer solution Take 2.5 mg by nebulization every 6 (six) hours as needed for wheezing or shortness of breath.   Yes [provider]  dicyclomine (BENTYL) 10 MG capsule Take 10 mg by mouth as needed for pain. 10/20/17  Yes [provider]  Lactobacillus-Inulin (Viola PO) Take 1 capsule by mouth 2 (two) times daily.   Yes [provider]  lovastatin (MEVACOR) 40 MG tablet Take 40 mg by mouth at bedtime.   Yes [provider]  ranitidine (ZANTAC) 150 MG tablet Take 150 mg by mouth every evening.  08/06/17  Yes [provider]    Inpatient Medications: Scheduled Meds: . diclofenac sodium  2 g Topical QID  . docusate sodium  100 mg Oral BID  . enoxaparin (LOVENOX) injection  40 mg Subcutaneous QHS  . famotidine  20 mg Oral QHS  . feeding supplement (ENSURE ENLIVE)  237 mL Oral TID BM  . polyethylene glycol  17  g Oral Daily  . pravastatin  40 mg Oral q1800  . senna  2 tablet Oral Daily   Continuous Infusions: . diltiazem (CARDIZEM) infusion Stopped (11/05/17 2316)  . piperacillin-tazobactam (ZOSYN)  IV 3.375 g (11/06/17 0624)  . sodium phosphate  Dextrose 5% IVPB     PRN Meds: acetaminophen **OR** acetaminophen, albuterol,  bisacodyl, dicyclomine, fentaNYL (SUBLIMAZE) injection, ondansetron **OR** ondansetron (ZOFRAN) IV  Allergies:   No Known Allergies  Social History:   Social History   Socioeconomic History  . Marital status: Married    Spouse name: Not on file  . Number of children: Not on file  . Years of education: Not on file  . Highest education level: Not on file  Occupational History  . Not on file  Social Needs  . Financial resource strain: Not on file  . Food insecurity:    Worry: Not on file    Inability: Not on file  . Transportation needs:    Medical: Not on file    Non-medical: Not on file  Tobacco Use  . Smoking status: Former Smoker    Packs/day: 1.50    Years: 70.00    Pack years: 105.00    Types: Cigarettes  . Smokeless tobacco: Never Used  Substance and Sexual Activity  . Alcohol use: Never    Frequency: Never  . Drug use: Never  . Sexual activity: Not on file  Lifestyle  . Physical activity:    Days per week: Not on file    Minutes per session: Not on file  . Stress: Not on file  Relationships  . Social connections:    Talks on phone: Not on file    Gets together: Not on file    Attends religious service: Not on file    Active member of club or organization: Not on file    Attends meetings of clubs or organizations: Not on file    Relationship status: Not on file  . Intimate partner violence:    Fear of current or ex partner: Not on file    Emotionally abused: Not on file    Physically abused: Not on file    Forced sexual activity: Not on file  Other Topics Concern  . Not on file  Social History Narrative  . Not on file    Family History:   Sister with multiple MI's. Mother died from complications of diabetes in her 56s. Father died in his 25s of unknown cause (failure to thrive?).   ROS:  Please see the history of present illness.   All other ROS reviewed and negative.     Physical Exam/Data:   Vitals:   11/06/17 0500 11/06/17 0600 11/06/17 0700  11/06/17 0800  BP: (!) 125/46 (!) 96/59 (!) 105/50 129/79  Pulse: 84 89 79 90  Resp: (!) 23 18 20  (!) 25  Temp:    97.9 F (36.6 C)  TempSrc:    Oral  SpO2: 100% 99% 100% 100%  Weight: 55.1 kg     Height:        Intake/Output Summary (Last 24 hours) at 11/06/2017 0953 Last data filed at 11/06/2017 0600 Gross per 24 hour  Intake 6109.43 ml  Output -  Net 6109.43 ml   Filed Weights   10/31/17 1704 10/31/17 2221 11/06/17 0500  Weight: 49.9 kg 53.3 kg 55.1 kg   Body mass index is 20.85 kg/m.  General:  Frail elderly lady laying in bed with mild rigors, improved with blankets; otherwise NAD  HEENT: sclera anicteric  Neck: no JVD Vascular: No carotid bruits; distal pulses 2+ bilaterally Cardiac:  normal S1, S2; RRR; no murmurs, rubs, or gallops Lungs:  clear to auscultation bilaterally, no wheezing, rhonchi or rales  Abd: NABS, soft, mild TTP, no hepatomegaly Ext: no edema Musculoskeletal:  No deformities Skin: warm and dry  Neuro:  CNs 2-12 intact, no focal abnormalities noted Psych:  Normal affect   EKG:  The EKG was personally reviewed and demonstrates:  Sinus rhythm with RBBB (old), no STE/D today. EKG from 17:20 11/05/17 with sinus tachycardia with PACs and ectopy Telemetry:  Telemetry was personally reviewed and demonstrates:  Episodes of SVT throughout admission; overall yesterday evenings episode appears to be SVT and sinus rhythm with PAC/PVCs and ectopy.   Relevant CV Studies: Echo: pending  Laboratory Data:  Chemistry Recent Labs  Lab 11/04/17 0618 11/05/17 0529 11/06/17 0447  NA 139 139 141  K 3.0* 4.2 4.2  CL 108 109 112*  CO2 23 23 23   GLUCOSE 79 74 78  BUN 12 12 10   CREATININE 0.65 0.63 0.55  CALCIUM 8.0* 8.0* 8.1*  GFRNONAA >60 >60 >60  GFRAA >60 >60 >60  ANIONGAP 8 7 6     Recent Labs  Lab 10/31/17 1739 11/02/17 0533 11/06/17 0447  PROT 5.5* 5.1* 4.4*  ALBUMIN 2.1* 1.9* 1.5*  AST 15 12* 12*  ALT 7 8 6   ALKPHOS 48 38 43  BILITOT 0.4 0.5  0.9   Hematology Recent Labs  Lab 11/04/17 0618 11/05/17 0529 11/06/17 0447  WBC 13.5* 9.2 8.8  RBC 2.91* 3.14* 3.08*  HGB 8.2* 8.9* 8.6*  HCT 25.3* 27.5* 27.4*  MCV 86.9 87.6 89.0  MCH 28.2 28.3 27.9  MCHC 32.4 32.4 31.4  RDW 16.8* 17.0* 17.1*  PLT 418* 471* 492*   Cardiac Enzymes Recent Labs  Lab 10/31/17 1739  TROPONINI <0.03   No results for input(s): TROPIPOC in the last 168 hours.  BNPNo results for input(s): BNP, PROBNP in the last 168 hours.  DDimer No results for input(s): DDIMER in the last 168 hours.  Radiology/Studies:  Dg Chest Port 1 View  Result Date: 11/05/2017 CLINICAL DATA:  Lung crackles EXAM: PORTABLE CHEST 1 VIEW COMPARISON:  07/13/2017 FINDINGS: Small bilateral pleural effusions. Left basilar airspace disease. Stable cardiomediastinal silhouette with aortic atherosclerosis. Vascular congestion with mild interstitial and hazy edema. No pneumothorax. IMPRESSION: 1. Small bilateral pleural effusions with left basilar atelectasis or infiltrate 2. Vascular congestion with mild interstitial and hazy edema Electronically Signed   By: Donavan Foil M.D.   On: 11/05/2017 20:10    Assessment and Plan:   1. SVT: ?atrial fibrillation episode which occurred 11/05/17. EKG and telemetry reviewed and seem more consistent with sinus tachycardia with PACs and frequent ectopy as well as SVT. BP has been intermittently soft. She is receiving IVFs. She is asymptomatic - Will start low dose BBlocker and monitor response. Uptitrate as tolerated. - In the event that the patient does have some paroxysmal atrial fibrillation, would be hesitant to anticoagulate this frail elderly lady with a hemoglobin of 8.6.  2. Diverticulitis: presented with abdominal pain and found to have mild diverticulitis on CT A/P. On IV antibiotics. Overall abdominal pain has improved but still with mild TTP.  - Continue management per primary team  3. Anemia: prior anemia work-up in 06/2017 with iron and  folate deficiency. Hgb persistently low in the 8.5 range. No reported bleeding from the patient.  - Consider repeating anemia panel -  Will defer work-up/ management to primary team  4. AAA: had been following with a provider outpatient q39mo for routine monitoring, however was lost to follow-up when she became home bound. CT A/P with 4.5cm infrarenal aneurysm this admission.  - Encouraged routine outpatient follow-up for close monitoring  For questions or updates, please contact [facility] Please consult [email or web address] for contact info under Cardiology/STEMI.   Signed, Abigail Butts, PA-C  11/06/2017 9:53 AM [phone]  I have examined the patient and reviewed assessment and plan and discussed with patient.  Agree with above as stated.  I personally reviewed her telemetry and ECGs.  It appears she is in NSR with frequent PACs and short runs of atrial tachycardia.  No clear atrial fibrillation.  Would not anticoagulate this frail, elderly woman who is already anemic.  Continue to titrate metoprolol as BP allows.  Her arrhythmia is likely related to her underlying GI illness.  THe patient is asymptomatic.  As her GI issues improve, the arrhythmia should become less frequent.   Larae Grooms

## 2017-11-06 NOTE — Progress Notes (Addendum)
PROGRESS NOTE  Melissa Riddle EVO:[id number] DOB: 12-Mar-1928 DOA: 10/31/2017 PCP: [facility]  HPI/Recap of past 24 hours: Melissa Riddle is a 84 y.o. female with medical history significant of COPD, AAA.  Patient has had some trouble with constipation chronically.  Was doing better constipation wise while on high fiber diet.  Back in May of this year, had diverticulitis, treated medically.  Had recurrent pain last week in abdomen.  High fiber diet stopped to allow for "bowel rest", and patient put on cipro/flagyl for presumed recurrent diverticulitis.  Since then last BM was 6-7 days ago. Completed course of cipro/flagyl but pain persists.  CT reveals mild sigmoid diverticulitis complicated by a blind-ending fistula and large stool burden suggestive of constipation.  11/05/2017: reports severe lower back pain, dull and nonradiating appears to be at the region of her previous vertebroplasty at L4.  Voltaren gel ordered to be applied 4 times daily.  Also has very poor appetite and is not eating.  Will start gentle IV fluid hydration to avoid dehydration.  On this evening, the patient developed supraventricular tachycardia with rate in the 150s to 170s.  Was given IV Lopressor, transferred to stepdown unit and started on Cardizem drip.    11/06/2017: Patient seen and examined at her bedside.  She has no new complaints.  Per RN patient had 2 large emesis.  Obtain lactic acid.  This afternoon the patient became hypotensive with metoprolol 12.5 mg twice daily, requiring bolus of IV fluid to maintain map above 65.    Assessment/Plan: Principal Problem:   Diverticulitis of sigmoid colon Active Problems:   Constipation  1. 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 Leukocytosis have resolved On IV Zosyn day #5 Poor oral intake,  patient encouraged to increase her protein calorie intake Due to persistent vomiting obtain lactic acid   New supraventricular tachycardia 2D echo done unrevealing Cardiology following Started on metoprolol 12.5 mg twice daily Monitor blood pressure  New Hypotension In the setting of new cardiac medication Maintain map above 65 Given 5 a cc of normal saline at 1 Continue to monitor vital signs  Mild pulmonary edema Chest x-ray done on 11/05/2017 independently reviewed revealed mild increase in pulmonary vascularity Caution with IV fluid Monitor O2 saturation Maintain O2 saturation above 92%  Chronic hypoxic respiratory failure On 2 L of oxygen at baseline Home O2 evaluation to assess requirement Management as stated above  Severe acute on chronic lower back pain History of vertebroplasty at L4 Continue Voltaren gel 4 times daily Nonradiating;  Continue to monitor  Poor oral intake/dehydration/anorexia Patient encouraged to eat and drink No specific triggers per daughter Start gentle IV fluid hydration  Monitor urine output  Resolved leukocytosis       Resolving chronic constipation - DC laxative due to multiple bowel movements  Generalized weakness/physical debility/dysfunction PT recommends SNF; patient and daughter decline SNF, okay with home health PT       Fall precautions; out of bed to chair as tolerated with every shift  2. Resolved hypokalemia, repleted with oral potassium 40 mEq once  Chronic normocytic anemia-no sign of overt bleeding.  Obtain iron studies.  Repeat CBC in the morning  Code Status: full  Family Communication: None at bedside  Disposition Plan: Home possibly tomorrow 11/07/2017.  Patient daughter decline SNF.   Consultants:  Surgery  Procedures:  None  Antimicrobials:  IV Zosyn  DVT prophylaxis: Subcu Lovenox   Objective: Vitals:   11/06/17 1100 11/06/17 1200 11/06/17 1300 11/06/17 1539  BP: (!) 130/37 (!) 116/54 (!)  138/58 (!) 82/40  Pulse: (!) 49 (!) 116 (!) 136 92  Resp: (!) 21 (!) 28 (!) 31 (!) 24  Temp:  98.4 F (36.9 C)    TempSrc:  Oral    SpO2: 100% 100% 100% 91%  Weight:      Height:        Intake/Output Summary (Last 24 hours) at 11/06/2017 1547 Last data filed at 11/06/2017 1033 Gross per 24 hour  Intake 6061.09 ml  Output -  Net 6061.09 ml   Filed Weights   10/31/17 1704 10/31/17 2221 11/06/17 0500  Weight: 49.9 kg 53.3 kg 55.1 kg    Exam:  . General: 84 y.o. year-old female well-developed well-nourished in no acute distress.  Alert and minimally interactive. . Cardiovascular: Regular rate and rhythm with no rubs or gallops.  No JVD or thyromegaly noted. Marland Kitchen Respiratory: Clear to auscultation with no wheezes or rales. Good inspiratory effort. . Abdomen: Mild tenderness in the right lower quadrant on palpation.  Normal bowel sounds x4 quadrants.  . Musculoskeletal: No lower extremity edema.  Trace edema affecting feet only low ankles.  2/4 pulses in all 4 extremities. Marland Kitchen Psychiatry: Mood is appropriate for condition and setting   Data Reviewed: CBC: Recent Labs  Lab 10/31/17 1739 11/02/17 0533 11/03/17 0545 11/04/17 0618 11/05/17 0529 11/06/17 0447  WBC 11.2* 15.7* 15.1* 13.5* 9.2 8.8  NEUTROABS 8.7*  --   --   --   --   --   HGB 10.5* 9.4* 9.5* 8.2* 8.9* 8.6*  HCT 32.4* 28.8* 29.7* 25.3* 27.5* 27.4*  MCV 85.7 86.5 87.6 86.9 87.6 89.0  PLT 570* 531* 493* 418* 471* 902*   Basic Metabolic Panel: Recent Labs  Lab 10/31/17 1739  11/02/17 0533 11/03/17 0545 11/04/17 0618 11/05/17 0529 11/06/17 0447  NA 138   < > 140 137 139 139 141  K 2.7*   < > 3.8 3.4* 3.0* 4.2 4.2  CL 100   < > 105 102 108 109 112*  CO2 28   < > 29 28 23 23 23   GLUCOSE 97   < > 99 86 79 74 78  BUN 9   < > 9 12 12 12 10   CREATININE 0.69   < > 0.74 0.74 0.65 0.63 0.55  CALCIUM 8.8*   < > 8.4* 8.2* 8.0* 8.0* 8.1*  MG 2.1  --   --   --  2.0  --  1.9  PHOS  --   --   --   --   --   --  2.3*   <  > = values in this interval not displayed.   GFR: Estimated Creatinine Clearance: 42.8 mL/min (by C-G formula based on SCr of 0.55 mg/dL). Liver Function Tests: Recent Labs  Lab 10/31/17 1739 11/02/17 0533 11/06/17 0447  AST 15 12* 12*  ALT 7 8 6   ALKPHOS 48 38 43  BILITOT 0.4 0.5 0.9  PROT 5.5* 5.1* 4.4*  ALBUMIN 2.1* 1.9* 1.5*   Recent Labs  Lab 10/31/17 1739  LIPASE 32   No results for input(s): AMMONIA in the last 168 hours. Coagulation Profile: No results for input(s): INR, PROTIME in the last 168 hours. Cardiac Enzymes: Recent Labs  Lab 10/31/17 1739  TROPONINI <0.03   BNP (last 3 results) No results for input(s):  PROBNP in the last 8760 hours. HbA1C: No results for input(s): HGBA1C in the last 72 hours. CBG: No results for input(s): GLUCAP in the last 168 hours. Lipid Profile: No results for input(s): CHOL, HDL, LDLCALC, TRIG, CHOLHDL, LDLDIRECT in the last 72 hours. Thyroid Function Tests: Recent Labs    11/05/17 2002  TSH 5.167*  FREET4 1.29   Anemia Panel: No results for input(s): VITAMINB12, FOLATE, FERRITIN, TIBC, IRON, RETICCTPCT in the last 72 hours. Urine analysis:    Component Value Date/Time   COLORURINE YELLOW 10/31/2017 1701   APPEARANCEUR CLOUDY (A) 10/31/2017 1701   LABSPEC 1.010 10/31/2017 1701   PHURINE 7.0 10/31/2017 1701   GLUCOSEU NEGATIVE 10/31/2017 1701   HGBUR MODERATE (A) 10/31/2017 1701   BILIRUBINUR NEGATIVE 10/31/2017 1701   KETONESUR NEGATIVE 10/31/2017 1701   PROTEINUR NEGATIVE 10/31/2017 1701   NITRITE NEGATIVE 10/31/2017 1701   LEUKOCYTESUR LARGE (A) 10/31/2017 1701   Sepsis Labs: @LABRCNTIP (procalcitonin:4,lacticidven:4)  ) Recent Results (from the past 240 hour(s))  Culture, blood (routine x 2)     Status: None (Preliminary result)   Collection Time: 11/02/17  1:54 PM  Result Value Ref Range Status   Specimen Description   Final    BLOOD RIGHT HAND Performed at [facility], [place]  [address], [place], [place] [address]    Special Requests   Final    BOTTLES DRAWN AEROBIC ONLY Blood Culture results may not be optimal due to an inadequate volume of blood received in culture bottles Performed at [facility] [address], [place], [place] [address]    Culture   Final    NO GROWTH 4 DAYS Performed at [facility], [place] [address], [place], [place] [address]    Report Status PENDING  Incomplete  Culture, blood (routine x 2)     Status: None (Preliminary result)   Collection Time: 11/02/17  2:05 PM  Result Value Ref Range Status   Specimen Description   Final    BLOOD RIGHT ANTECUBITAL Performed at [facility] [address] [place], [place] [address]    Special Requests   Final    BOTTLES DRAWN AEROBIC ONLY Blood Culture results may not be optimal due to an inadequate volume of blood received in culture bottles Performed at [facility] [address], [place], [place] [address]    Culture   Final    NO GROWTH 4 DAYS Performed at [facility], [place] [address], [place], [place] [address]    Report Status PENDING  Incomplete  MRSA PCR Screening     Status: Abnormal   Collection Time: 11/06/17  6:31 AM  Result Value Ref Range Status   MRSA by PCR POSITIVE (A) NEGATIVE Final    Comment:        The GeneXpert MRSA Assay (FDA approved for NASAL specimens only), is one component of a comprehensive MRSA colonization surveillance program. It is not intended to diagnose MRSA infection nor to guide or monitor treatment for MRSA infections. RESULT CALLED TO, READ BACK BY AND VERIFIED WITH: DUONG,T @ 6195 KD [id number] BY POTEAT,S Performed at [facility] [address] [place],  [address]       Studies: Dg Chest Port 1 View  Result Date: 11/05/2017 CLINICAL DATA:  Lung crackles EXAM: PORTABLE CHEST 1 VIEW COMPARISON:  07/13/2017 FINDINGS: Small bilateral pleural  effusions. Left basilar airspace disease. Stable cardiomediastinal silhouette with aortic atherosclerosis. Vascular congestion with  mild interstitial and hazy edema. No pneumothorax. IMPRESSION: 1. Small bilateral pleural effusions with left basilar atelectasis or infiltrate 2. Vascular congestion with mild interstitial and hazy edema Electronically Signed   By: Donavan Foil M.D.   On: 11/05/2017 20:10    Scheduled Meds: . Chlorhexidine Gluconate Cloth  6 each Topical Q0600  . diclofenac sodium  2 g Topical QID  . enoxaparin (LOVENOX) injection  40 mg Subcutaneous QHS  . famotidine  20 mg Oral QHS  . feeding supplement (ENSURE ENLIVE)  237 mL Oral TID BM  . metoprolol tartrate  12.5 mg Oral BID  . mupirocin ointment  1 application Nasal BID  . polyethylene glycol  17 g Oral Daily  . pravastatin  40 mg Oral q1800    Continuous Infusions: . piperacillin-tazobactam (ZOSYN)  IV 3.375 g (11/06/17 1332)  . sodium chloride    . sodium phosphate  Dextrose 5% IVPB 43 mL/hr at 11/06/17 1033     LOS: 6 days     Kayleen Memos, MD [facility] Pager [phone]  If 7PM-7AM, please contact night-coverage [email or web address] Password [facility] 11/06/2017, 3:47 PM

## 2017-11-06 NOTE — Progress Notes (Addendum)
Telemetry monitor called RN and stated that pt's heart rate came up to the 160-170s. PA Kroeger was on unit and was informed of pt's heart rate. Order for Metoprolol was place by PA. Will administer once Pharmacy verifies order.

## 2017-11-06 NOTE — Progress Notes (Signed)
  Echocardiogram 2D Echocardiogram has been performed.  Melissa Riddle F 11/06/2017, 11:10 AM

## 2017-11-07 LAB — CBC
HEMATOCRIT: 26.9 % — AB (ref 36.0–46.0)
HEMOGLOBIN: 8.5 g/dL — AB (ref 12.0–15.0)
MCH: 27.3 pg (ref 26.0–34.0)
MCHC: 31.6 g/dL (ref 30.0–36.0)
MCV: 86.5 fL (ref 78.0–100.0)
Platelets: 358 10*3/uL (ref 150–400)
RBC: 3.11 MIL/uL — AB (ref 3.87–5.11)
RDW: 17 % — ABNORMAL HIGH (ref 11.5–15.5)
WBC: 11.1 10*3/uL — AB (ref 4.0–10.5)

## 2017-11-07 LAB — BASIC METABOLIC PANEL
ANION GAP: 8 (ref 5–15)
BUN: 10 mg/dL (ref 8–23)
CALCIUM: 8.2 mg/dL — AB (ref 8.9–10.3)
CHLORIDE: 109 mmol/L (ref 98–111)
CO2: 24 mmol/L (ref 22–32)
Creatinine, Ser: 0.56 mg/dL (ref 0.44–1.00)
GFR calc non Af Amer: >60 mL/min (ref >60)
Glucose, Bld: 84 mg/dL (ref 70–99)
Potassium: 3 mmol/L — ABNORMAL LOW (ref 3.5–5.1)
Sodium: 141 mmol/L (ref 135–145)

## 2017-11-07 LAB — CULTURE, BLOOD (ROUTINE X 2)
CULTURE: NO GROWTH
CULTURE: NO GROWTH

## 2017-11-07 LAB — POTASSIUM: Potassium: 3.9 mmol/L (ref 3.5–5.1)

## 2017-11-07 MED ORDER — POTASSIUM CHLORIDE 10 MEQ/100ML IV SOLN
10.0000 meq | Freq: Once | INTRAVENOUS | Status: AC
  Administered 2017-11-07: 10 meq via INTRAVENOUS
  Filled 2017-11-07: qty 100

## 2017-11-07 NOTE — Progress Notes (Signed)
PROGRESS NOTE  Melissa Riddle CXK:[id number] DOB: 07/10/1928 DOA: 10/31/2017 PCP: [facility]  Brief Narrative:  77 female with history of COPD AAA diverticulosis diverticulitis chronic constipation who was admitted with sigmoid type with sclerosis and possible fistula severe lower back pain with history of vertebroplasty at L4 failure to thrive and electrolyte abnormality.  CT scan revealed mild sigmoid diverticulitis complicated by a blind ending fistula and large stool burden suggestive of constipation that was on the day of admission  Interval history/Subjective: 75 female with history of COPD AAA who was admitted with sigmoid diverticulitis and possible fistula, severe lower back pain with history of vertebroplasty at L4 failure to thrive and electrolyte abnormality.  She had intermittent bouts of SVT is and was started on beta-blocker which initially lowered her blood pressure she was started on Lopressor 12.5 mg twice a day but with some fluids her blood pressure stabilized.  Physical therapy recommended SNF the patient and her daughter have declined.  She was seen by cardiology who was in agreement with the start of Lopressor with titration and recommended outpatient follow-up for her aortic abdominal aneurysm.  Assessment/Plan: Sigmoid diverticulitis.  Surgery was consulted and signed off on November 04, 2017 and recommended to follow-up with GI as outpatient to evaluate the colon.  We will continue Zosyn and transition to p.o. antibiotics on discharge Hypokalemia of 3.0.  It may be due to the large bowel movement on November 06, 2017 couple with initial nausea and vomiting.  Potassium repleted with 10 mEq IV of potassium today and it brought it up to 3.9 Aortic abdominal aneurysm for outpatient follow-up New onset SVT patient started on Lopressor.Doing well Severe low back pain due to L4 compression fraction.  Her daughter stated that narcotic causes her to be loopy and  wants to avoid narcotics Constipation , patient had a large bowel movement on November 06, 2017  DVT prophylaxis: Lovenox Code Status: Full Family Communication: Daughter over the phone Disposition Plan: Home with home health care tomorrow  Melissa Riddle [facility] Pager [phone]  11/07/2017, 5:22 PM  LOS: 7 days   Consultants:  Cardiology  Social worker  Physical therapy  Surgery  Procedures:  Physical therapy  Antimicrobials:  Zosyn   Objective: Vitals:  Vitals:   11/07/17 1200 11/07/17 1600  BP:    Pulse:    Resp:    Temp: 97.9 F (36.6 C) 97.9 F (36.6 C)  SpO2:      Exam:  Constitutional:  . Appears calm and comfortable frail looking malnourished Eyes:  . pupils and irises appear normal . Normal lids and conjunctivae ENMT:  . grossly normal hearing  . Lips appear normal . external ears, nose appear normal . Oropharynx: mucosa, tongue,posterior pharynx appear normal Neck:  . neck appears normal, no masses, normal ROM, supple . no thyromegaly Respiratory:  . CTA bilaterally, no w/r/r.  . Respiratory effort normal. No retractions or accessory muscle use Cardiovascular:  . RRR, no m/r/g . Right LE extremity edema   . Right hand dorsum edema . Normal pedal pulses Abdomen:  . Abdomen appears normal; no tenderness or masses . No hernias . No HSM Musculoskeletal:  . Moves, RUE, LUE, RLE, LLE   o strength and tone normal, no atrophy, no abnormal movements o No tenderness, masses o Normal ROM, no contractures  Skin:  . No rashes, lesions, ulcers . palpation of skin: no induration or nodules Neurologic:  . CN 2-12 intact . Sensation all 4 extremities intact Psychiatric:  .  Mental status o Mood, affect appropriate o Orientation to person, place, time  . judgment and insight appear intact     I have personally reviewed the following:   Data: Results for Melissa, Riddle (MRN [id number]) as of 11/08/2017 08:15  Ref. Range 11/07/2017  01:60  BASIC METABOLIC PANEL Unknown Rpt (A)  Sodium Latest Ref Range: 135 - 145 mmol/L 141  Potassium Latest Ref Range: 3.5 - 5.1 mmol/L 3.0 (L)  Chloride Latest Ref Range: 98 - 111 mmol/L 109  CO2 Latest Ref Range: 22 - 32 mmol/L 24  Glucose Latest Ref Range: 70 - 99 mg/dL 84  BUN Latest Ref Range: 8 - 23 mg/dL 10  Creatinine Latest Ref Range: 0.44 - 1.00 mg/dL 0.56  Calcium Latest Ref Range: 8.9 - 10.3 mg/dL 8.2 (L)  Anion gap Latest Ref Range: 5 - 15  8  GFR, Est Non African American Latest Ref Range: >60 mL/min >60  GFR, Est African American Latest Ref Range: >60 mL/min >60  WBC Latest Ref Range: 4.0 - 10.5 K/uL 11.1 (H)  RBC Latest Ref Range: 3.87 - 5.11 MIL/uL 3.11 (L)  Hemoglobin Latest Ref Range: 12.0 - 15.0 g/dL 8.5 (L)  HCT Latest Ref Range: 36.0 - 46.0 % 26.9 (L)  MCV Latest Ref Range: 78.0 - 100.0 fL 86.5  MCH Latest Ref Range: 26.0 - 34.0 pg 27.3  MCHC Latest Ref Range: 30.0 - 36.0 g/dL 31.6  RDW Latest Ref Range: 11.5 - 15.5 % 17.0 (H)  Platelets Latest Ref Range: 150 - 400 K/uL 358  .   Scheduled Meds: . Chlorhexidine Gluconate Cloth  6 each Topical Q0600  . diclofenac sodium  2 g Topical QID  . enoxaparin (LOVENOX) injection  40 mg Subcutaneous QHS  . famotidine  20 mg Oral QHS  . feeding supplement (ENSURE ENLIVE)  237 mL Oral TID BM  . metoprolol tartrate  12.5 mg Oral BID  . mupirocin ointment  1 application Nasal BID  . polyethylene glycol  17 g Oral Daily  . pravastatin  40 mg Oral q1800   Continuous Infusions: . piperacillin-tazobactam (ZOSYN)  IV 3.375 g (11/07/17 1355)    Principal Problem:   Diverticulitis of sigmoid colon Active Problems:   Constipation   LOS: 7 days

## 2017-11-07 NOTE — Progress Notes (Signed)
Progress Note  Patient Name: Melissa Riddle Date of Encounter: 11/07/2017  Consulting Cardiologist: Dr. Larae Grooms  Subjective   Patient resting this morning but wakes up to voice.  No specific complaints.  Inpatient Medications    Scheduled Meds: . Chlorhexidine Gluconate Cloth  6 each Topical Q0600  . diclofenac sodium  2 g Topical QID  . enoxaparin (LOVENOX) injection  40 mg Subcutaneous QHS  . famotidine  20 mg Oral QHS  . feeding supplement (ENSURE ENLIVE)  237 mL Oral TID BM  . metoprolol tartrate  12.5 mg Oral BID  . mupirocin ointment  1 application Nasal BID  . polyethylene glycol  17 g Oral Daily  . pravastatin  40 mg Oral q1800   Continuous Infusions: . piperacillin-tazobactam (ZOSYN)  IV 3.375 g (11/07/17 0602)   PRN Meds: acetaminophen **OR** acetaminophen, albuterol, dicyclomine, fentaNYL (SUBLIMAZE) injection, iopamidol, ondansetron **OR** ondansetron (ZOFRAN) IV   Vital Signs    Vitals:   11/07/17 0500 11/07/17 0600 11/07/17 0700 11/07/17 0800  BP: (!) 112/97 104/62 (!) 100/59 (!) 135/56  Pulse: (!) 46 81 (!) 46 86  Resp: 20 18 20  (!) 22  Temp:      TempSrc:      SpO2: 100% 100% 100% 100%  Weight:      Height:        Intake/Output Summary (Last 24 hours) at 11/07/2017 0817 Last data filed at 11/07/2017 0400 Gross per 24 hour  Intake 951.45 ml  Output -  Net 951.45 ml   Filed Weights   10/31/17 2221 11/06/17 0500 11/07/17 0442  Weight: 53.3 kg 55.1 kg 58.2 kg    Telemetry    Sinus rhythm with atrial ectopy and bursts of brief SVT.  Personally reviewed.  ECG    Tracing from 11/06/2017 showed sinus rhythm with right bundle branch block.  Personally reviewed.  Physical Exam   GEN:  Frail elderly woman, no acute distress.   Neck: No JVD. Cardiac: RRR, no gallop.  Respiratory: Nonlabored. Clear to auscultation bilaterally. GI: Soft, bowel sounds present. MS:  Trace ankle edema; No deformity. Neuro:  Nonfocal. Psych: Normal  affect.  Labs    Chemistry Recent Labs  Lab 10/31/17 1739  11/02/17 0533  11/05/17 0529 11/06/17 0447 11/07/17 0330  NA 138   < > 140   < > 139 141 141  K 2.7*   < > 3.8   < > 4.2 4.2 3.0*  CL 100   < > 105   < > 109 112* 109  CO2 28   < > 29   < > 23 23 24   GLUCOSE 97   < > 99   < > 74 78 84  BUN 9   < > 9   < > 12 10 10   CREATININE 0.69   < > 0.74   < > 0.63 0.55 0.56  CALCIUM 8.8*   < > 8.4*   < > 8.0* 8.1* 8.2*  PROT 5.5*  --  5.1*  --   --  4.4*  --   ALBUMIN 2.1*  --  1.9*  --   --  1.5*  --   AST 15  --  12*  --   --  12*  --   ALT 7  --  8  --   --  6  --   ALKPHOS 48  --  38  --   --  43  --   BILITOT 0.4  --  0.5  --   --  0.9  --   GFRNONAA >60   < > >60   < > >60 >60 >60  GFRAA >60   < > >60   < > >60 >60 >60  ANIONGAP 10   < > 6   < > 7 6 8    < > = values in this interval not displayed.     Hematology Recent Labs  Lab 11/06/17 0447 11/06/17 1643 11/07/17 0330  WBC 8.8 14.5* 11.1*  RBC 3.08* 3.12* 3.11*  HGB 8.6* 8.9* 8.5*  HCT 27.4* 27.8* 26.9*  MCV 89.0 89.1 86.5  MCH 27.9 28.5 27.3  MCHC 31.4 32.0 31.6  RDW 17.1* 16.8* 17.0*  PLT 492* 476* 358    Cardiac Enzymes Recent Labs  Lab 10/31/17 1739  TROPONINI <0.03   No results for input(s): TROPIPOC in the last 168 hours.   Radiology    Dg Chest Port 1 View  Result Date: 11/05/2017 CLINICAL DATA:  Lung crackles EXAM: PORTABLE CHEST 1 VIEW COMPARISON:  07/13/2017 FINDINGS: Small bilateral pleural effusions. Left basilar airspace disease. Stable cardiomediastinal silhouette with aortic atherosclerosis. Vascular congestion with mild interstitial and hazy edema. No pneumothorax. IMPRESSION: 1. Small bilateral pleural effusions with left basilar atelectasis or infiltrate 2. Vascular congestion with mild interstitial and hazy edema Electronically Signed   By: Donavan Foil M.D.   On: 11/05/2017 20:10    Cardiac Studies   Echocardiogram 11/06/2017: Study Conclusions  - Left ventricle: The cavity  size was normal. Wall thickness was   normal. Systolic function was normal. The estimated ejection   fraction was in the range of 60% to 65%. Wall motion was normal;   there were no regional wall motion abnormalities. Features are   consistent with a pseudonormal left ventricular filling pattern,   with concomitant abnormal relaxation and increased filling   pressure (grade 2 diastolic dysfunction). - Mitral valve: Calcified annulus. - Pulmonary arteries: Systolic pressure was mildly increased. PA   peak pressure: 33 mm Hg (S).  Patient Profile     84 y.o. female with a history of COPD and AAA, currently admitted with sigmoid diverticulitis and possible fistula, severe lower back pain with history of vertebroplasty at L4, failure to thrive, and electrolyte abnormalities.  She has had intermittent bursts of SVT and was started on beta-blocker.  Assessment & Plan    1.  Atrial ectopy and bursts of SVT by telemetry.  Agree with initiation of Lopressor 12.5 mg twice daily.  LVEF is normal range by echocardiogram at 60 to 65%.  2.  Abdominal aortic aneurysm measuring 4.5 cm, asymptomatic with plan for follow-up as an outpatient.  I reviewed the cardiology consultation note by Dr. Irish Lack.  Agree with initiation of Lopressor 12.5 mg twice daily.  This can be uptitrated as necessary by the primary team, and hopefully her atrial ectopy and bursts of SVT will settle down as her other comorbidities improve.  No additional cardiac testing is planned at this time.  [facility] will sign off.   Medication Recommendations: Continue Lopressor with titration as needed per primary team. Other recommendations (labs, testing, etc): No additional cardiac testing is planned.  Call back if needed. Follow up as an outpatient: She should have follow-up with primary provider as an outpatient regarding AAA.  Signed, Rozann Lesches, MD  11/07/2017, 8:17 AM

## 2017-11-08 MED ORDER — SODIUM CHLORIDE 0.9 % IV BOLUS
200.0000 mL | Freq: Once | INTRAVENOUS | Status: AC
  Administered 2017-11-08: 200 mL via INTRAVENOUS

## 2017-11-08 MED ORDER — TRAMADOL HCL 50 MG PO TABS
25.0000 mg | ORAL_TABLET | Freq: Four times a day (QID) | ORAL | Status: DC | PRN
  Administered 2017-11-08: 25 mg via ORAL
  Filled 2017-11-08: qty 1

## 2017-11-08 MED ORDER — ACETAMINOPHEN 325 MG PO TABS: 650.0000 mg | ORAL_TABLET | Freq: Four times a day (QID) | ORAL | 0 refills | Status: DC | PRN

## 2017-11-08 MED ORDER — DICLOFENAC SODIUM 1 % TD GEL: 2.0000 g | Freq: Four times a day (QID) | TRANSDERMAL | 0 refills | Status: AC

## 2017-11-08 MED ORDER — ENSURE ENLIVE PO LIQD: 237.0000 mL | Freq: Three times a day (TID) | ORAL | 12 refills | Status: DC

## 2017-11-08 MED ORDER — TRAMADOL HCL 50 MG PO TABS: 25.0000 mg | ORAL_TABLET | Freq: Four times a day (QID) | ORAL | 0 refills | Status: DC | PRN

## 2017-11-08 MED ORDER — MUPIROCIN 2 % EX OINT: 1.0000 "application " | TOPICAL_OINTMENT | Freq: Two times a day (BID) | CUTANEOUS | 0 refills | Status: DC

## 2017-11-08 MED ORDER — SODIUM CHLORIDE 0.9 % IV SOLN: INTRAVENOUS | Status: DC | PRN

## 2017-11-08 MED ORDER — POLYETHYLENE GLYCOL 3350 17 G PO PACK: 17.0000 g | PACK | Freq: Every day | ORAL | 0 refills | Status: DC

## 2017-11-08 MED ORDER — METOPROLOL TARTRATE 25 MG PO TABS: 12.5000 mg | ORAL_TABLET | Freq: Two times a day (BID) | ORAL | 0 refills | Status: AC

## 2017-11-08 NOTE — Progress Notes (Signed)
Patient will be discharging home today.  Medical necessity form filled out and placed in discharge packet. [facility] has been called for transportation.  Pricilla Holm, MSW, [place] [facility] [phone]

## 2017-11-08 NOTE — Care Management Note (Signed)
Case Management Note  Patient Details  Name: Emmagene Ortner MRN: [id number] Date of Birth: 1929/01/30  Subjective/Objective:   Sigmoid diverticulitis                 Action/Plan: Pt lives at home with dtr. Active with [facility] for HH. Contacted [facility] to make aware of dc home today.   Expected Discharge Date:  11/08/17               Expected Discharge Plan:  [place]  In-House Referral:  NA  Discharge planning Services  CM Consult  Post Acute Care Choice:  Home Health, Resumption of Svcs/PTA Provider Choice offered to:  Adult Children  DME Arranged:  N/A DME Agency:  NA  HH Arranged:  RN, PT HH Agency:  [facility] (formerly [employer])  Status of Service:  Completed, signed off  If discussed at [employer], dates discussed:    Additional Comments:  Erenest Rasher, RN 11/08/2017, 11:44 AM

## 2017-11-08 NOTE — Discharge Summary (Signed)
Discharge Summary  Melissa Riddle EHM:[id number] DOB: 1928/06/14  PCP: [facility]  Admit date: 10/31/2017 Discharge date: 11/08/2017  Time spent: 30 minutes  Recommendations for Outpatient Follow-up:  1. Follow-up outpatient for evaluation of aortic abdominal aneurysm  Discharge Diagnoses:  Active Hospital Problems   Diagnosis Date Noted  . Diverticulitis of sigmoid colon 10/31/2017  . Constipation 10/31/2017    Resolved Hospital Problems  No resolved problems to display.    Discharge Condition: Stable  Diet recommendation: Regular  Vitals:   11/08/17 0915 11/08/17 1000  BP: (!) 122/42 (!) 131/49  Pulse: 88 81  Resp:  (!) 23  Temp:    SpO2:  100%    History of present illness:  48 female with history of COPD AAA diverticulosis diverticulitis chronic constipation who was admitted with sigmoid type with sclerosis and possible fistula severe lower back pain with history of vertebroplasty at L4 failure to thrive and electrolyte abnormality.  CT scan revealed mild sigmoid diverticulitis complicated by a blind ending fistula and large stool burden suggestive of constipation that was on the day of admission  Hospital Course:  Principal Problem:   Diverticulitis of sigmoid colon Active Problems:   Constipation BACK PAIN SVT AAA  She had intermittent bouts of SVT is and was started on beta-blocker which initially lowered her blood pressure she was started on Lopressor 12.5 mg twice a day but with some fluids her blood pressure stabilized.  Physical therapy recommended SNF the patient and her daughter have declined.  She was seen by cardiology who was in agreement with the start of Lopressor with titration and recommended outpatient follow-up for her aortic abdominal aneurysm.  DVT prophylaxis: Lovenox Code Status: Full Family Communication: Daughter Arbie Cookey over the phone Disposition Plan: Home with home health care  tomorrow  Consultants:  Cardiology  Social worker  Physical therapy  Surgery  Procedures:  Physical therapy  Antimicrobials:  Zosyn  Discharge Exam: BP (!) 131/49   Pulse 81   Temp 97.9 F (36.6 C) (Oral)   Resp (!) 23   Ht 5\' 4"  (1.626 m)   Wt 57 kg   SpO2 100%   BMI 21.57 kg/m   General: Alert oriented x3 pleasant no distress Cardiovascular: Heart rates regular Respiratory: Unlabored respiration  Discharge Instructions You were cared for by a hospitalist during your hospital stay. If you have any questions about your discharge medications or the care you received while you were in the hospital after you are discharged, you can call the unit and asked to speak with the hospitalist on call if the hospitalist that took care of you is not available. Once you are discharged, your primary care physician will handle any further medical issues. Please note that NO REFILLS for any discharge medications will be authorized once you are discharged, as it is imperative that you return to your primary care physician (or establish a relationship with a primary care physician if you do not have one) for your aftercare needs so that they can reassess your need for medications and monitor your lab values.  Discharge Instructions    Call MD for:  extreme fatigue   Complete by:  As directed    Call MD for:  persistant dizziness or light-headedness   Complete by:  As directed    Diet - low sodium heart healthy   Complete by:  As directed    Diet - low sodium heart healthy   Complete by:  As directed    Discharge  instructions   Complete by:  As directed    Primary care provider   Discharge instructions   Complete by:  As directed    PCP FOR AAA EVALUATION AND TREATMENT   Increase activity slowly   Complete by:  As directed    Increase activity slowly   Complete by:  As directed      Allergies as of 11/08/2017   No Known Allergies     Medication List    TAKE these  medications   acetaminophen 325 MG tablet Commonly known as:  TYLENOL Take 2 tablets (650 mg total) by mouth every 6 (six) hours as needed for mild pain (or Fever >/= 101).   albuterol 108 (90 Base) MCG/ACT inhaler Commonly known as:  PROVENTIL HFA;VENTOLIN HFA Inhale 2 puffs into the lungs daily.   albuterol (2.5 MG/3ML) 0.083% nebulizer solution Commonly known as:  PROVENTIL Take 2.5 mg by nebulization every 6 (six) hours as needed for wheezing or shortness of breath.   CULTURELLE DIGESTIVE HEALTH PO Take 1 capsule by mouth 2 (two) times daily.   diclofenac sodium 1 % Gel Commonly known as:  VOLTAREN Apply 2 g topically 4 (four) times daily.   dicyclomine 10 MG capsule Commonly known as:  BENTYL Take 10 mg by mouth as needed for pain.   feeding supplement (ENSURE ENLIVE) Liqd Take 237 mLs by mouth 3 (three) times daily between meals.   lovastatin 40 MG tablet Commonly known as:  MEVACOR Take 40 mg by mouth at bedtime.   metoprolol tartrate 25 MG tablet Commonly known as:  LOPRESSOR Take 0.5 tablets (12.5 mg total) by mouth 2 (two) times daily.   mupirocin ointment 2 % Commonly known as:  BACTROBAN Place 1 application into the nose 2 (two) times daily.   polyethylene glycol packet Commonly known as:  MIRALAX / GLYCOLAX Take 17 g by mouth daily.   ranitidine 150 MG tablet Commonly known as:  ZANTAC Take 150 mg by mouth every evening.   traMADol 50 MG tablet Commonly known as:  ULTRAM Take 0.5 tablets (25 mg total) by mouth every 6 (six) hours as needed for up to 5 days for moderate pain or severe pain (can give along with acetaminophen).      No Known Allergies    The results of significant diagnostics from this hospitalization (including imaging, microbiology, ancillary and laboratory) are listed below for reference.    Significant Diagnostic Studies: Ct Abdomen Pelvis W Contrast  Result Date: 10/31/2017 CLINICAL DATA:  Lower abdominal pain.  Suspected  diverticulitis. EXAM: CT ABDOMEN AND PELVIS WITH CONTRAST TECHNIQUE: Multidetector CT imaging of the abdomen and pelvis was performed using the standard protocol following bolus administration of intravenous contrast. CONTRAST:  124mL ISOVUE-300 IOPAMIDOL (ISOVUE-300) INJECTION 61% COMPARISON:  07/10/2017 FINDINGS: Lower Chest: Small bilateral pleural effusions are mildly increased since previous study. Hepatobiliary: 2.0 cm heterogeneous low-attenuation lesion in the right hepatic lobe adjacent gallbladder fossa shows no significant change but has indeterminate characteristics. No other liver masses identified. Prior cholecystectomy. No evidence of biliary obstruction. Pancreas:  No mass or inflammatory changes. Spleen: Within normal limits in size and appearance. Adrenals/Urinary Tract: No masses identified. No evidence of hydronephrosis. Stomach/Bowel: Sigmoid diverticulosis is again demonstrated. Increased wall thickening is seen involving the mid to distal sigmoid colon, consistent with acute diverticulitis. There is also a blind-ending fistula seen along the left lateral wall of the sigmoid colon, without evidence of abscess. Large amount of stool is also seen throughout the colon.  Small bowel is nondilated and unremarkable in appearance. Vascular/Lymphatic: No pathologically enlarged lymph nodes. A bilobed infrarenal abdominal aortic aneurysm is again seen which measures 4.5 cm in maximum diameter, and is not significantly changed since prior exam. Reproductive:  No mass or other significant abnormality. Other:  None. Musculoskeletal: No suspicious bone lesions identified. Old L4 vertebral body compression fracture with prior vertebroplasty again noted. 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 No evidence of abscess. Large stool burden noted; recommend clinical correlation for possible constipation. Increased small bilateral pleural effusions.  Stable bilobed infrarenal abdominal aortic aneurysm measuring 4.5 cm. Recommend followup by abdomen and pelvis CTA in 6 months, and vascular surgery referral/consultation if not already obtained. This recommendation follows ACR consensus guidelines: White Paper of the ACR Incidental Findings Committee II on Vascular Findings. J Am Coll Radiol 2013; 10:789-794. Stable 2 cm indeterminate lesion in the right hepatic lobe. Recommend abdomen MRI without and with contrast for further evaluation if patient can cooperate with breath holding instructions. Electronically Signed   By: Earle Gell M.D.   On: 10/31/2017 20:24   Dg Chest Port 1 View  Result Date: 11/05/2017 CLINICAL DATA:  Lung crackles EXAM: PORTABLE CHEST 1 VIEW COMPARISON:  07/13/2017 FINDINGS: Small bilateral pleural effusions. Left basilar airspace disease. Stable cardiomediastinal silhouette with aortic atherosclerosis. Vascular congestion with mild interstitial and hazy edema. No pneumothorax. IMPRESSION: 1. Small bilateral pleural effusions with left basilar atelectasis or infiltrate 2. Vascular congestion with mild interstitial and hazy edema Electronically Signed   By: Donavan Foil M.D.   On: 11/05/2017 20:10    Microbiology: Recent Results (from the past 240 hour(s))  Culture, blood (routine x 2)     Status: None   Collection Time: 11/02/17  1:54 PM  Result Value Ref Range Status   Specimen Description   Final    BLOOD RIGHT HAND Performed at [facility] [address], [place], [place] [address]    Special Requests   Final    BOTTLES DRAWN AEROBIC ONLY Blood Culture results may not be optimal due to an inadequate volume of blood received in culture bottles Performed at [facility] [address] [place], [place] [address]    Culture   Final    NO GROWTH 5 DAYS Performed at [facility], [place] [address] [place], [place] [address]    Report Status 11/07/2017 FINAL  Final   Culture, blood (routine x 2)     Status: None   Collection Time: 11/02/17  2:05 PM  Result Value Ref Range Status   Specimen Description   Final    BLOOD RIGHT ANTECUBITAL Performed at [facility] [address] [place], [place] [address]    Special Requests   Final    BOTTLES DRAWN AEROBIC ONLY Blood Culture results may not be optimal due to an inadequate volume of blood received in culture bottles Performed at [facility] [address], [place], [place] [address]    Culture   Final    NO GROWTH 5 DAYS Performed at [facility], [place] [address], [place], [place] [address]    Report Status 11/07/2017 FINAL  Final  MRSA PCR Screening     Status: Abnormal   Collection Time: 11/06/17  6:31 AM  Result Value Ref Range Status   MRSA by PCR POSITIVE (A) NEGATIVE Final    Comment:  The GeneXpert MRSA Assay (FDA approved for NASAL specimens only), is one component of a comprehensive MRSA colonization surveillance program. It is not intended to diagnose MRSA infection nor to guide or monitor treatment for MRSA infections. RESULT CALLED TO, READ BACK BY AND VERIFIED WITH: DUONG,T @ [id number] GD [id number] BY POTEAT,S Performed at [facility] [address] [place],  [address]      Labs: Basic Metabolic Panel: Recent Labs  Lab 11/03/17 0545 11/04/17 0618 11/05/17 0529 11/06/17 0447 11/07/17 0330 11/07/17 1403  NA 137 139 139 141 141  --   K 3.4* 3.0* 4.2 4.2 3.0* 3.9  CL 102 108 109 112* 109  --   CO2 28 23 23 23 24   --   GLUCOSE 86 79 74 78 84  --   BUN 12 12 12 10 10   --   CREATININE 0.74 0.65 0.63 0.55 0.56  --   CALCIUM 8.2* 8.0* 8.0* 8.1* 8.2*  --   MG  --  2.0  --  1.9  --   --   PHOS  --   --   --  2.3*  --   --    Liver Function Tests: Recent Labs  Lab 11/02/17 0533 11/06/17 0447  AST 12* 12*  ALT 8 6  ALKPHOS 38 43  BILITOT 0.5 0.9  PROT 5.1* 4.4*  ALBUMIN 1.9* 1.5*   No  results for input(s): LIPASE, AMYLASE in the last 168 hours. No results for input(s): AMMONIA in the last 168 hours. CBC: Recent Labs  Lab 11/04/17 0618 11/05/17 0529 11/06/17 0447 11/06/17 1643 11/07/17 0330  WBC 13.5* 9.2 8.8 14.5* 11.1*  HGB 8.2* 8.9* 8.6* 8.9* 8.5*  HCT 25.3* 27.5* 27.4* 27.8* 26.9*  MCV 86.9 87.6 89.0 89.1 86.5  PLT 418* 471* 492* 476* 358   Cardiac Enzymes: No results for input(s): CKTOTAL, CKMB, CKMBINDEX, TROPONINI in the last 168 hours. BNP: BNP (last 3 results) Recent Labs    07/10/17 1110  BNP 130.9*    ProBNP (last 3 results) No results for input(s): PROBNP in the last 8760 hours.  CBG: No results for input(s): GLUCAP in the last 168 hours.     Signed:  Cristal Deer, MD [facility] 11/08/2017, 11:02 AM

## 2017-11-08 NOTE — Progress Notes (Signed)
Patient's daughter, Arbie Cookey, called by RN to provide discharge education. All questions answered. Arbie Cookey was also informed that all information would be placed in discharge envelope.

## 2017-11-08 NOTE — Progress Notes (Signed)
Daughter, Arbie Cookey, called by RN to verify that she is home. Medication information updated in AVS form.  Patient picked up by [facility]. Belongings sent with patient.  PIVs removed.

## 2017-11-13 ENCOUNTER — Inpatient Hospital Stay (HOSPITAL_COMMUNITY)
Admission: EM | Admit: 2017-11-13 | Discharge: 2017-11-28 | DRG: 329 | Disposition: A | Discharge status: Home Health | Payer: Medicare FFS | Attending: Internal Medicine | Admitting: Internal Medicine

## 2017-11-13 ENCOUNTER — Other Ambulatory Visit: Payer: Self-pay

## 2017-11-13 ENCOUNTER — Encounter (HOSPITAL_COMMUNITY): Payer: Self-pay | Admitting: Emergency Medicine

## 2017-11-13 ENCOUNTER — Emergency Department (HOSPITAL_COMMUNITY): Payer: Medicare FFS

## 2017-11-13 DIAGNOSIS — K5900 Constipation, unspecified: Secondary | ICD-10-CM | POA: Diagnosis not present

## 2017-11-13 DIAGNOSIS — E785 Hyperlipidemia, unspecified: Secondary | ICD-10-CM | POA: Diagnosis present

## 2017-11-13 DIAGNOSIS — N828 Other female genital tract fistulae: Secondary | ICD-10-CM | POA: Diagnosis not present

## 2017-11-13 DIAGNOSIS — E876 Hypokalemia: Secondary | ICD-10-CM | POA: Diagnosis not present

## 2017-11-13 DIAGNOSIS — R627 Adult failure to thrive: Secondary | ICD-10-CM | POA: Diagnosis present

## 2017-11-13 DIAGNOSIS — Z515 Encounter for palliative care: Secondary | ICD-10-CM | POA: Diagnosis not present

## 2017-11-13 DIAGNOSIS — J9610 Chronic respiratory failure, unspecified whether with hypoxia or hypercapnia: Secondary | ICD-10-CM | POA: Diagnosis present

## 2017-11-13 DIAGNOSIS — R0602 Shortness of breath: Secondary | ICD-10-CM

## 2017-11-13 DIAGNOSIS — Z79899 Other long term (current) drug therapy: Secondary | ICD-10-CM

## 2017-11-13 DIAGNOSIS — Z681 Body mass index (BMI) 19 or less, adult: Secondary | ICD-10-CM | POA: Diagnosis not present

## 2017-11-13 DIAGNOSIS — R54 Age-related physical debility: Secondary | ICD-10-CM | POA: Diagnosis present

## 2017-11-13 DIAGNOSIS — R64 Cachexia: Secondary | ICD-10-CM | POA: Diagnosis present

## 2017-11-13 DIAGNOSIS — Z01818 Encounter for other preprocedural examination: Secondary | ICD-10-CM

## 2017-11-13 DIAGNOSIS — N823 Fistula of vagina to large intestine: Secondary | ICD-10-CM | POA: Diagnosis present

## 2017-11-13 DIAGNOSIS — Z87891 Personal history of nicotine dependence: Secondary | ICD-10-CM

## 2017-11-13 DIAGNOSIS — K5909 Other constipation: Secondary | ICD-10-CM | POA: Diagnosis present

## 2017-11-13 DIAGNOSIS — R630 Anorexia: Secondary | ICD-10-CM

## 2017-11-13 DIAGNOSIS — K5732 Diverticulitis of large intestine without perforation or abscess without bleeding: Principal | ICD-10-CM | POA: Diagnosis present

## 2017-11-13 DIAGNOSIS — I714 Abdominal aortic aneurysm, without rupture: Secondary | ICD-10-CM | POA: Diagnosis present

## 2017-11-13 DIAGNOSIS — R0600 Dyspnea, unspecified: Secondary | ICD-10-CM | POA: Diagnosis not present

## 2017-11-13 DIAGNOSIS — Z66 Do not resuscitate: Secondary | ICD-10-CM | POA: Diagnosis not present

## 2017-11-13 DIAGNOSIS — I10 Essential (primary) hypertension: Secondary | ICD-10-CM

## 2017-11-13 DIAGNOSIS — K5792 Diverticulitis of intestine, part unspecified, without perforation or abscess without bleeding: Secondary | ICD-10-CM | POA: Diagnosis not present

## 2017-11-13 DIAGNOSIS — J449 Chronic obstructive pulmonary disease, unspecified: Secondary | ICD-10-CM | POA: Diagnosis present

## 2017-11-13 DIAGNOSIS — D72829 Elevated white blood cell count, unspecified: Secondary | ICD-10-CM | POA: Diagnosis not present

## 2017-11-13 DIAGNOSIS — Z7401 Bed confinement status: Secondary | ICD-10-CM

## 2017-11-13 DIAGNOSIS — Z7189 Other specified counseling: Secondary | ICD-10-CM

## 2017-11-13 DIAGNOSIS — I5030 Unspecified diastolic (congestive) heart failure: Secondary | ICD-10-CM | POA: Diagnosis present

## 2017-11-13 DIAGNOSIS — D6489 Other specified anemias: Secondary | ICD-10-CM | POA: Diagnosis present

## 2017-11-13 DIAGNOSIS — I471 Supraventricular tachycardia: Secondary | ICD-10-CM | POA: Diagnosis present

## 2017-11-13 DIAGNOSIS — Z9981 Dependence on supplemental oxygen: Secondary | ICD-10-CM

## 2017-11-13 DIAGNOSIS — N824 Other female intestinal-genital tract fistulae: Secondary | ICD-10-CM | POA: Diagnosis present

## 2017-11-13 DIAGNOSIS — I340 Nonrheumatic mitral (valve) insufficiency: Secondary | ICD-10-CM | POA: Diagnosis not present

## 2017-11-13 DIAGNOSIS — D473 Essential (hemorrhagic) thrombocythemia: Secondary | ICD-10-CM | POA: Diagnosis not present

## 2017-11-13 DIAGNOSIS — Z993 Dependence on wheelchair: Secondary | ICD-10-CM

## 2017-11-13 DIAGNOSIS — E43 Unspecified severe protein-calorie malnutrition: Secondary | ICD-10-CM | POA: Diagnosis present

## 2017-11-13 DIAGNOSIS — I110 Hypertensive heart disease with heart failure: Secondary | ICD-10-CM | POA: Diagnosis present

## 2017-11-13 DIAGNOSIS — R1032 Left lower quadrant pain: Secondary | ICD-10-CM | POA: Diagnosis present

## 2017-11-13 LAB — COMPREHENSIVE METABOLIC PANEL
ALK PHOS: 66 U/L (ref 38–126)
ALT: 9 U/L (ref 0–44)
ANION GAP: 8 (ref 5–15)
AST: 17 U/L (ref 15–41)
Albumin: 2 g/dL — ABNORMAL LOW (ref 3.5–5.0)
BUN: 13 mg/dL (ref 8–23)
CO2: 31 mmol/L (ref 22–32)
CREATININE: 0.68 mg/dL (ref 0.44–1.00)
Calcium: 9.6 mg/dL (ref 8.9–10.3)
Chloride: 100 mmol/L (ref 98–111)
GFR calc non Af Amer: >60 mL/min (ref >60)
Glucose, Bld: 104 mg/dL — ABNORMAL HIGH (ref 70–99)
Potassium: 3.3 mmol/L — ABNORMAL LOW (ref 3.5–5.1)
SODIUM: 139 mmol/L (ref 135–145)
TOTAL PROTEIN: 6 g/dL — AB (ref 6.5–8.1)
Total Bilirubin: 0.5 mg/dL (ref 0.3–1.2)

## 2017-11-13 LAB — URINALYSIS, ROUTINE W REFLEX MICROSCOPIC
Bilirubin Urine: NEGATIVE
Glucose, UA: NEGATIVE mg/dL
Hgb urine dipstick: NEGATIVE
Ketones, ur: NEGATIVE mg/dL
Leukocytes, UA: NEGATIVE
NITRITE: NEGATIVE
PROTEIN: NEGATIVE mg/dL
Specific Gravity, Urine: 1.006 (ref 1.005–1.030)
pH: 8 (ref 5.0–8.0)

## 2017-11-13 LAB — CBC WITH DIFFERENTIAL/PLATELET
Basophils Absolute: 0 10*3/uL (ref 0.0–0.1)
Basophils Relative: 0 %
Eosinophils Absolute: 0.1 10*3/uL (ref 0.0–0.7)
Eosinophils Relative: 1 %
HCT: 34.5 % — ABNORMAL LOW (ref 36.0–46.0)
HEMOGLOBIN: 10.9 g/dL — AB (ref 12.0–15.0)
LYMPHS ABS: 1.5 10*3/uL (ref 0.7–4.0)
LYMPHS PCT: 10 %
MCH: 28 pg (ref 26.0–34.0)
MCHC: 31.6 g/dL (ref 30.0–36.0)
MCV: 88.7 fL (ref 78.0–100.0)
MONOS PCT: 6 %
Monocytes Absolute: 0.9 10*3/uL (ref 0.1–1.0)
NEUTROS ABS: 13.1 10*3/uL — AB (ref 1.7–7.7)
NEUTROS PCT: 83 %
Platelets: 627 10*3/uL — ABNORMAL HIGH (ref 150–400)
RBC: 3.89 MIL/uL (ref 3.87–5.11)
RDW: 16.8 % — ABNORMAL HIGH (ref 11.5–15.5)
WBC: 15.6 10*3/uL — AB (ref 4.0–10.5)

## 2017-11-13 LAB — LIPASE, BLOOD: Lipase: 21 U/L (ref 11–51)

## 2017-11-13 MED ORDER — PIPERACILLIN-TAZOBACTAM 3.375 G IVPB
3.3750 g | Freq: Three times a day (TID) | INTRAVENOUS | Status: DC
  Administered 2017-11-14 – 2017-11-24 (×30): 3.375 g via INTRAVENOUS
  Filled 2017-11-13 (×30): qty 50

## 2017-11-13 MED ORDER — ONDANSETRON HCL 4 MG/2ML IJ SOLN
4.0000 mg | Freq: Four times a day (QID) | INTRAMUSCULAR | Status: DC | PRN
  Administered 2017-11-19: 4 mg via INTRAVENOUS
  Filled 2017-11-13: qty 2

## 2017-11-13 MED ORDER — FAMOTIDINE 20 MG PO TABS
20.0000 mg | ORAL_TABLET | Freq: Every day | ORAL | Status: DC
  Administered 2017-11-14 – 2017-11-16 (×3): 20 mg via ORAL
  Filled 2017-11-13 (×3): qty 1

## 2017-11-13 MED ORDER — ACETAMINOPHEN 325 MG PO TABS
650.0000 mg | ORAL_TABLET | Freq: Four times a day (QID) | ORAL | Status: DC | PRN
  Administered 2017-11-21 – 2017-11-26 (×5): 650 mg via ORAL
  Filled 2017-11-13 (×5): qty 2

## 2017-11-13 MED ORDER — IOPAMIDOL (ISOVUE-300) INJECTION 61%
100.0000 mL | Freq: Once | INTRAVENOUS | Status: AC | PRN
  Administered 2017-11-13: 100 mL via INTRAVENOUS

## 2017-11-13 MED ORDER — ENOXAPARIN SODIUM 40 MG/0.4ML ~~LOC~~ SOLN
40.0000 mg | SUBCUTANEOUS | Status: DC
  Administered 2017-11-13 – 2017-11-21 (×8): 40 mg via SUBCUTANEOUS
  Filled 2017-11-13 (×9): qty 0.4

## 2017-11-13 MED ORDER — POLYETHYLENE GLYCOL 3350 17 G PO PACK
17.0000 g | PACK | Freq: Every day | ORAL | Status: DC
  Administered 2017-11-13 – 2017-11-25 (×8): 17 g via ORAL
  Filled 2017-11-13 (×11): qty 1

## 2017-11-13 MED ORDER — ONDANSETRON HCL 4 MG PO TABS: 4.0000 mg | ORAL_TABLET | Freq: Four times a day (QID) | ORAL | Status: DC | PRN

## 2017-11-13 MED ORDER — ONDANSETRON HCL 4 MG/2ML IJ SOLN
4.0000 mg | Freq: Once | INTRAMUSCULAR | Status: AC
  Administered 2017-11-13: 4 mg via INTRAVENOUS
  Filled 2017-11-13: qty 2

## 2017-11-13 MED ORDER — MORPHINE SULFATE (PF) 2 MG/ML IV SOLN
2.0000 mg | INTRAVENOUS | Status: DC | PRN
  Administered 2017-11-14 – 2017-11-26 (×21): 2 mg via INTRAVENOUS
  Filled 2017-11-13 (×23): qty 1

## 2017-11-13 MED ORDER — METOPROLOL TARTRATE 12.5 MG HALF TABLET
12.5000 mg | ORAL_TABLET | Freq: Two times a day (BID) | ORAL | Status: DC
  Administered 2017-11-13 – 2017-11-28 (×25): 12.5 mg via ORAL
  Filled 2017-11-13 (×29): qty 1

## 2017-11-13 MED ORDER — POTASSIUM CHLORIDE CRYS ER 20 MEQ PO TBCR
20.0000 meq | EXTENDED_RELEASE_TABLET | Freq: Once | ORAL | Status: AC
  Administered 2017-11-13: 20 meq via ORAL
  Filled 2017-11-13: qty 1

## 2017-11-13 MED ORDER — PIPERACILLIN-TAZOBACTAM 3.375 G IVPB
3.3750 g | Freq: Once | INTRAVENOUS | Status: AC
  Administered 2017-11-13: 3.375 g via INTRAVENOUS
  Filled 2017-11-13: qty 50

## 2017-11-13 MED ORDER — ENSURE ENLIVE PO LIQD
237.0000 mL | Freq: Three times a day (TID) | ORAL | Status: DC
  Administered 2017-11-14 – 2017-11-19 (×11): 237 mL via ORAL

## 2017-11-13 MED ORDER — PRAVASTATIN SODIUM 20 MG PO TABS
40.0000 mg | ORAL_TABLET | Freq: Every day | ORAL | Status: DC
  Administered 2017-11-14 – 2017-11-27 (×13): 40 mg via ORAL
  Filled 2017-11-13 (×15): qty 2

## 2017-11-13 MED ORDER — ACETAMINOPHEN 650 MG RE SUPP: 650.0000 mg | Freq: Four times a day (QID) | RECTAL | Status: DC | PRN

## 2017-11-13 MED ORDER — MORPHINE SULFATE (PF) 2 MG/ML IV SOLN
2.0000 mg | Freq: Once | INTRAVENOUS | Status: AC
  Administered 2017-11-13: 2 mg via INTRAVENOUS
  Filled 2017-11-13: qty 1

## 2017-11-13 MED ORDER — ALBUTEROL SULFATE (2.5 MG/3ML) 0.083% IN NEBU
3.0000 mL | INHALATION_SOLUTION | Freq: Every day | RESPIRATORY_TRACT | Status: DC
  Administered 2017-11-14 – 2017-11-22 (×8): 3 mL via RESPIRATORY_TRACT
  Filled 2017-11-13 (×9): qty 3

## 2017-11-13 NOTE — ED Notes (Signed)
ED Provider at bedside. 

## 2017-11-13 NOTE — ED Notes (Signed)
Patient transported to CT 

## 2017-11-13 NOTE — H&P (Signed)
History and Physical    Melissa Riddle HUD:[id number] DOB: 03-22-1928 DOA: 11/13/2017  [facility]P: [facility]  Patient coming from: Home  I have personally briefly reviewed patient's old medical records in Melissa Riddle  Chief Complaint: Abd pain  HPI: Melissa Riddle is a 84 y.o. female with medical history significant of COPD, AAA, chronic constipation.  Patient admitted in May of this year, had diverticulitis, treated medically.  Had recurrent diverticulitis  At the beginning of this month.  First attempted to treat outpt with cipro/flagyl, then when that failed was admitted for IV zosyn.  Fistula noted during that CT scan.  Per family surgery reportedly saw patient but wanted colonoscopy in 6 weeks.  Symptoms improved with IV zosyn, patient discharged on 9/15; however, symptoms have reoccured today (5 days later).  Patient with LLQ abd pain, also passing stool through vagina now.  Returns to ED.   ED Course: WBC 15K.  CT scan seems to confirm colo uterine fistula as well.   Review of Systems: As per HPI otherwise 10 point review of systems negative.   Past Medical History:  Diagnosis Date  . Abdominal aneurysm ([place])   . Chronic diarrhea 07/10/2017  . COPD (chronic obstructive pulmonary disease) ([place])     History reviewed. No pertinent surgical history.   reports that she has quit smoking. Her smoking use included cigarettes. She has a 105.00 pack-year smoking history. She has never used smokeless tobacco. She reports that she does not drink alcohol or use drugs.  No Known Allergies  History reviewed. No pertinent family history. No h/o diverticulitis.  Prior to Admission medications   Medication Sig Start Date End Date Taking? Authorizing Provider  acetaminophen (TYLENOL) 325 MG tablet Take 2 tablets (650 mg total) by mouth every 6 (six) hours as needed for mild pain (or Fever >/= 101). 11/08/17  Yes Cristal Deer, MD  acetaminophen (TYLENOL) 500 MG  tablet Take 500 mg by mouth every 6 (six) hours as needed for moderate pain.   Yes [provider]  albuterol (PROVENTIL HFA;VENTOLIN HFA) 108 (90 Base) MCG/ACT inhaler Inhale 2 puffs into the lungs daily.   Yes [provider]  cholecalciferol (VITAMIN D) 1000 units tablet Take 1,000 Units by mouth daily.   Yes [provider]  diclofenac sodium (VOLTAREN) 1 % GEL Apply 2 g topically 4 (four) times daily. 11/08/17  Yes Cristal Deer, MD  feeding supplement, ENSURE ENLIVE, (ENSURE ENLIVE) LIQD Take 237 mLs by mouth 3 (three) times daily between meals. 11/08/17  Yes Cristal Deer, MD  Lactobacillus-Inulin ([place] PO) Take 1 capsule by mouth 2 (two) times daily.   Yes [provider]  lovastatin (MEVACOR) 40 MG tablet Take 40 mg by mouth daily.    Yes [provider]  metoprolol tartrate (LOPRESSOR) 25 MG tablet Take 0.5 tablets (12.5 mg total) by mouth 2 (two) times daily. 11/08/17  Yes Cristal Deer, MD  ranitidine (ZANTAC) 150 MG tablet Take 150 mg by mouth daily.  08/06/17  Yes [provider]  traMADol (ULTRAM) 50 MG tablet Take 0.5 tablets (25 mg total) by mouth every 6 (six) hours as needed for up to 5 days for moderate pain or severe pain (can give along with acetaminophen). Patient taking differently: Take 12.5 mg by mouth every 6 (six) hours as needed for moderate pain or severe pain (can give along with acetaminophen).  11/08/17 11/13/17 Yes Cristal Deer, MD  polyethylene glycol (MIRALAX / GLYCOLAX) packet Take 17 g  by mouth daily. 11/08/17   Cristal Deer, MD    Physical Exam: Vitals:   11/13/17 1820 11/13/17 1925 11/13/17 2000 11/13/17 2100  BP: (!) 141/66     Pulse: 85 81 83 80  Resp:      Temp:      TempSrc:      SpO2: 98% 100% 99% 97%    Constitutional: NAD, calm, comfortable Eyes: PERRL, lids and conjunctivae normal ENMT: Mucous membranes are moist. Posterior pharynx clear of any exudate or  lesions.Normal dentition.  Neck: normal, supple, no masses, no thyromegaly Respiratory: clear to auscultation bilaterally, no wheezing, no crackles. Normal respiratory effort. No accessory muscle use.  Cardiovascular: Regular rate and rhythm, no murmurs / rubs / gallops. No extremity edema. 2+ pedal pulses. No carotid bruits.  Abdomen: Mild TTP LLQ Musculoskeletal: no clubbing / cyanosis. No joint deformity upper and lower extremities. Good ROM, no contractures. Normal muscle tone.  Skin: no rashes, lesions, ulcers. No induration Neurologic: CN 2-12 grossly intact. Sensation intact, DTR normal. Strength 5/5 in all 4.  Psychiatric: Normal judgment and insight. Alert and oriented x 3. Normal mood.    Labs on Admission: I have personally reviewed following labs and imaging studies  CBC: Recent Labs  Lab 11/07/17 0330 11/13/17 1658  WBC 11.1* 15.6*  NEUTROABS  --  13.1*  HGB 8.5* 10.9*  HCT 26.9* 34.5*  MCV 86.5 88.7  PLT 358 850*   Basic Metabolic Panel: Recent Labs  Lab 11/07/17 0330 11/07/17 1403 11/13/17 1658  NA 141  --  139  K 3.0* 3.9 3.3*  CL 109  --  100  CO2 24  --  31  GLUCOSE 84  --  104*  BUN 10  --  13  CREATININE 0.56  --  0.68  CALCIUM 8.2*  --  9.6   GFR: Estimated Creatinine Clearance: 42.8 mL/min (by C-G formula based on SCr of 0.68 mg/dL). Liver Function Tests: Recent Labs  Lab 11/13/17 1658  AST 17  ALT 9  ALKPHOS 66  BILITOT 0.5  PROT 6.0*  ALBUMIN 2.0*   Recent Labs  Lab 11/13/17 1658  LIPASE 21   No results for input(s): AMMONIA in the last 168 hours. Coagulation Profile: No results for input(s): INR, PROTIME in the last 168 hours. Cardiac Enzymes: No results for input(s): CKTOTAL, CKMB, CKMBINDEX, TROPONINI in the last 168 hours. BNP (last 3 results) No results for input(s): PROBNP in the last 8760 hours. HbA1C: No results for input(s): HGBA1C in the last 72 hours. CBG: No results for input(s): GLUCAP in the last 168  hours. Lipid Profile: No results for input(s): CHOL, HDL, LDLCALC, TRIG, CHOLHDL, LDLDIRECT in the last 72 hours. Thyroid Function Tests: No results for input(s): TSH, T4TOTAL, FREET4, T3FREE, THYROIDAB in the last 72 hours. Anemia Panel: No results for input(s): VITAMINB12, FOLATE, FERRITIN, TIBC, IRON, RETICCTPCT in the last 72 hours. Urine analysis:    Component Value Date/Time   COLORURINE YELLOW 11/13/2017 2047   APPEARANCEUR HAZY (A) 11/13/2017 2047   LABSPEC 1.006 11/13/2017 2047   PHURINE 8.0 11/13/2017 2047   GLUCOSEU NEGATIVE 11/13/2017 2047   HGBUR NEGATIVE 11/13/2017 2047   BILIRUBINUR NEGATIVE 11/13/2017 2047   KETONESUR NEGATIVE 11/13/2017 2047   PROTEINUR NEGATIVE 11/13/2017 2047   NITRITE NEGATIVE 11/13/2017 2047   LEUKOCYTESUR NEGATIVE 11/13/2017 2047    Radiological Exams on Admission: Ct Abdomen Pelvis W Contrast  Result Date: 11/13/2017 CLINICAL DATA:  Postprandial abdominal pain today. History of abdominal aortic aneurysm.  EXAM: CT ABDOMEN AND PELVIS WITH CONTRAST TECHNIQUE: Multidetector CT imaging of the abdomen and pelvis was performed using the standard protocol following bolus administration of intravenous contrast. CONTRAST:  136mL ISOVUE-300 IOPAMIDOL (ISOVUE-300) INJECTION 61% COMPARISON:  CT abdomen and pelvis October 31, 2017 FINDINGS: LOWER CHEST: Moderate bilateral pleural effusions increased from prior examination with compressive atelectasis. Included heart size is normal. No pericardial effusion. HEPATOBILIARY: Status post cholecystectomy. Minimal similar postoperative intrahepatic biliary dilatation. No discrete RIGHT hepatic lesion, prior finding could reflect hemangioma. PANCREAS: Normal. SPLEEN: Calcified granulomas. ADRENALS/URINARY TRACT: Kidneys are orthotopic, demonstrating symmetric enhancement. No hydronephrosis or solid renal masses. Early excretion of contrast resulting in appearance of small nephrolithiasis. The unopacified ureters are  normal in course and caliber. Delayed imaging through the kidneys demonstrates symmetric prompt contrast excretion within the proximal urinary collecting system. Urinary bladder is well distended, minimal layering density (series 2, image 72). Normal adrenal glands. STOMACH/BOWEL: Moderate sigmoid diverticulosis without acute diverticulitis. Similar anteriorly directed sigmoid colonic thick-walled outpouching containing gas and stool inseparable from uterus. Similar thick-walled short segment of sigmoid colon. Moderate amount of retained large bowel stool. Small amount of small bowel feces compatible with chronic stasis. Inflamed loop of small bowel within the pelvis, likely reactive. VASCULAR/LYMPHATIC: Stable peripherally calcified 4.5 cm infrarenal aortic aneurysm and 3.1 cm infrarenal aortic aneurysm with severe calcific atherosclerosis. No lymphadenopathy by CT size criteria. REPRODUCTIVE: Normal. OTHER: No intraperitoneal free fluid or free air. MUSCULOSKELETAL: Nonacute. Old mild L4 compression fracture, status post cement augmentation. Osteopenia. IMPRESSION: 1. Chronic sigmoid colonic diverticulitis with short thick-walled segment, neoplasm not excluded. Recommend GI consultation. 2. Similar pericolonic outpouching with gas inseparable from uterus concerning for fistula. 3. Moderate amount of retained large bowel stool without bowel obstruction. 4. Moderate bilateral pleural effusions increased from prior examination. 5. Redemonstration of bilobed infrarenal aortic aneurysm measuring to 4.5 cm. Recommend followup by abdomen and pelvis CTA in 6 months, and vascular surgery referral/consultation if not already obtained. This recommendation follows ACR consensus guidelines: White Paper of the ACR Incidental Findings Committee II on Vascular Findings. J Am Coll Radiol 2013; 10:789-794. Electronically Signed   By: Elon Alas M.D.   On: 11/13/2017 19:45    EKG: Independently  reviewed.  Assessment/Plan Principal Problem:   Acute diverticulitis Active Problems:   Constipation   Colouterine fistula    1. 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 Seems to be failing medical therapy 2. Dr. Excell Seltzer called by EDP: recs zosyn and he will see in AM 3. Zosyn 4. Clear liquid diet 5. Morphine PRN pain 2. Constipation - 1. Will put patient on miralax once daily (wasn't taking this at home following last admit, mod stool burden on CT). 3. HTN - 1. Holding  4. SVT last admit - will leave on the metoprolol started last admit.  DVT prophylaxis: Lovenox Code Status: Full Family Communication: Family at bedside Disposition Plan: Home after admit Consults called: Dr. Excell Seltzer Admission status: Admit to inpatient - IP status due to recurrent diverticulitis, failed outpt ABx, failed IP abx, and failed medical therapy in general.  Complicated now by colo uterine fistula.   Etta Quill DO [facility] Pager [phone] Only works nights!  If 7AM-7PM, please contact the primary day team physician taking care of patient  [email or web address] Password [email or web address]  11/13/2017, 9:57 PM

## 2017-11-13 NOTE — Progress Notes (Signed)
Pharmacy Antibiotic Note  Neeka Urista is a 84 y.o. female admitted on 11/13/2017 with intra-abdominal infection.  Pharmacy has been consulted for zosyn dosing.  Plan: Zosyn 3.375g IV q8h (4 hour infusion).  F/u scr/cultures     Temp (24hrs), Avg:99.1 F (37.3 C), Min:99.1 F (37.3 C), Max:99.1 F (37.3 C)  Recent Labs  Lab 11/07/17 0330 11/13/17 1658  WBC 11.1* 15.6*  CREATININE 0.56 0.68    Estimated Creatinine Clearance: 42.8 mL/min (by C-G formula based on SCr of 0.68 mg/dL).    No Known Allergies  Antimicrobials this admission: 9/20 zosyn >>    >>   Dose adjustments this admission:   Microbiology results:  BCx:   UCx:    Sputum:    MRSA PCR:   Thank you for allowing pharmacy to be a part of this patient's care.  Dorrene German 11/13/2017 10:24 PM

## 2017-11-13 NOTE — ED Provider Notes (Signed)
[facility] DEPT Provider Note   CSN: [id number] Arrival date & time: 11/13/17  1619     History   Chief Complaint Chief Complaint  Patient presents with  . Abdominal Pain    HPI Annett Boxwell is a 84 y.o. female with a history of COPD, diverticulitis with recent diagnosis per CT imaging on 10/31/17, completed her antibiotics and was feeling well until she had gradual onset of left lower abdominal pain mid afternoon today after eating a small chicken lunch.  She endorses a painful swelling at the site of pain which she states has been intermittent.  She denies n/v/ diarrhea, endorses occasional constipation, believes she had a small bm today.  She has had increased burping, passing flatus normally.  She denies chest pain and shortness of breath, also denies back pain.  She has had no medicines prior to arrival.    The history is provided by the patient and medical records.    Past Medical History:  Diagnosis Date  . Abdominal aneurysm ([place])   . Chronic diarrhea 07/10/2017  . COPD (chronic obstructive pulmonary disease) [facility])     Patient Active Problem List   Diagnosis Date Noted  . Acute diverticulitis 11/13/2017  . 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 Diverticulitis of sigmoid colon 10/31/2017  . Constipation 10/31/2017  . Malnutrition of moderate degree 07/13/2017  . COPD (chronic obstructive pulmonary disease) ([place]) 07/10/2017  . HLD (hyperlipidemia) 07/10/2017  . Acute colitis 07/10/2017  . Chronic diarrhea 07/10/2017  . FTT (failure to thrive) in adult 07/10/2017    History reviewed. No pertinent surgical history.   OB History   None      Home Medications    Prior to Admission medications   Medication Sig Start Date End Date Taking? Authorizing Provider  acetaminophen (TYLENOL) 325 MG tablet Take 2 tablets (650 mg total) by mouth every 6 (six) hours as needed for mild pain (or Fever >/= 101). 11/08/17  Yes Cristal Deer, MD    acetaminophen (TYLENOL) 500 MG tablet Take 500 mg by mouth every 6 (six) hours as needed for moderate pain.   Yes [provider]  albuterol (PROVENTIL HFA;VENTOLIN HFA) 108 (90 Base) MCG/ACT inhaler Inhale 2 puffs into the lungs daily.   Yes [provider]  cholecalciferol (VITAMIN D) 1000 units tablet Take 1,000 Units by mouth daily.   Yes [provider]  diclofenac sodium (VOLTAREN) 1 % GEL Apply 2 g topically 4 (four) times daily. 11/08/17  Yes Cristal Deer, MD  feeding supplement, ENSURE ENLIVE, (ENSURE ENLIVE) LIQD Take 237 mLs by mouth 3 (three) times daily between meals. 11/08/17  Yes Cristal Deer, MD  Lactobacillus-Inulin ([place] PO) Take 1 capsule by mouth 2 (two) times daily.   Yes [provider]  lovastatin (MEVACOR) 40 MG tablet Take 40 mg by mouth daily.    Yes [provider]  metoprolol tartrate (LOPRESSOR) 25 MG tablet Take 0.5 tablets (12.5 mg total) by mouth 2 (two) times daily. 11/08/17  Yes Cristal Deer, MD  ranitidine (ZANTAC) 150 MG tablet Take 150 mg by mouth daily.  08/06/17  Yes [provider]  traMADol (ULTRAM) 50 MG tablet Take 0.5 tablets (25 mg total) by mouth every 6 (six) hours as needed for up to 5 days for moderate pain or severe pain (can give along with acetaminophen). Patient taking differently: Take 12.5 mg by mouth every 6 (six) hours as needed for moderate pain or severe pain (can give along with  acetaminophen).  11/08/17 11/13/17 Yes Cristal Deer, MD  polyethylene glycol (MIRALAX / GLYCOLAX) packet Take 17 g by mouth daily. 11/08/17   Cristal Deer, MD    Family History History reviewed. No pertinent family history.  Social History Social History   Tobacco Use  . Smoking status: Former Smoker    Packs/day: 1.50    Years: 70.00    Pack years: 105.00    Types: Cigarettes  . Smokeless tobacco: Never Used  Substance Use Topics  . Alcohol use: Never     Frequency: Never  . Drug use: Never     Allergies   Patient has no known allergies.   Review of Systems Review of Systems  Constitutional: Negative for chills and fever.  HENT: Negative for congestion.   Eyes: Negative.   Respiratory: Negative for chest tightness and shortness of breath.   Cardiovascular: Negative for chest pain.  Gastrointestinal: Positive for abdominal pain and constipation. Negative for abdominal distention, diarrhea, nausea, rectal pain and vomiting.  Genitourinary: Negative.  Negative for dysuria.  Musculoskeletal: Negative for arthralgias.  Skin: Negative.   Neurological: Negative for weakness, light-headedness, numbness and headaches.  Psychiatric/Behavioral: Negative.      Physical Exam Updated Vital Signs BP (!) 141/66   Pulse 80   Temp 99.1 F (37.3 C) (Oral)   Resp 17   SpO2 97%   Physical Exam  Constitutional: She appears well-developed and well-nourished.  HENT:  Head: Normocephalic and atraumatic.  Eyes: Conjunctivae are normal.  Neck: Normal range of motion.  Cardiovascular: Normal rate, regular rhythm, normal heart sounds and intact distal pulses.  Pulmonary/Chest: Effort normal and breath sounds normal. She has no wheezes.  Abdominal: Soft. Bowel sounds are normal. There is tenderness in the left lower quadrant. There is guarding. There is no rebound.  ttp with tenderness LLQ.  No radiation.   Musculoskeletal: Normal range of motion.  Neurological: She is alert.  Skin: Skin is warm and dry.  Psychiatric: She has a normal mood and affect.  Nursing note and vitals reviewed.    ED Treatments / Results  Labs (all labs ordered are listed, but only abnormal results are displayed) Labs Reviewed  CBC WITH DIFFERENTIAL/PLATELET - Abnormal; Notable for the following components:      Result Value   WBC 15.6 (*)    Hemoglobin 10.9 (*)    HCT 34.5 (*)    RDW 16.8 (*)    Platelets 627 (*)    Neutro Abs 13.1 (*)    All other  components within normal limits  COMPREHENSIVE METABOLIC PANEL - Abnormal; Notable for the following components:   Potassium 3.3 (*)    Glucose, Bld 104 (*)    Total Protein 6.0 (*)    Albumin 2.0 (*)    All other components within normal limits  URINALYSIS, ROUTINE W REFLEX MICROSCOPIC - Abnormal; Notable for the following components:   APPearance HAZY (*)    All other components within normal limits  LIPASE, BLOOD  CBC  BASIC METABOLIC PANEL    EKG None  Radiology Ct Abdomen Pelvis W Contrast  Result Date: 11/13/2017 CLINICAL DATA:  Postprandial abdominal pain today. History of abdominal aortic aneurysm. EXAM: CT ABDOMEN AND PELVIS WITH CONTRAST TECHNIQUE: Multidetector CT imaging of the abdomen and pelvis was performed using the standard protocol following bolus administration of intravenous contrast. CONTRAST:  190mL ISOVUE-300 IOPAMIDOL (ISOVUE-300) INJECTION 61% COMPARISON:  CT abdomen and pelvis October 31, 2017 FINDINGS: LOWER CHEST: Moderate bilateral pleural effusions increased from  prior examination with compressive atelectasis. Included heart size is normal. No pericardial effusion. HEPATOBILIARY: Status post cholecystectomy. Minimal similar postoperative intrahepatic biliary dilatation. No discrete RIGHT hepatic lesion, prior finding could reflect hemangioma. PANCREAS: Normal. SPLEEN: Calcified granulomas. ADRENALS/URINARY TRACT: Kidneys are orthotopic, demonstrating symmetric enhancement. No hydronephrosis or solid renal masses. Early excretion of contrast resulting in appearance of small nephrolithiasis. The unopacified ureters are normal in course and caliber. Delayed imaging through the kidneys demonstrates symmetric prompt contrast excretion within the proximal urinary collecting system. Urinary bladder is well distended, minimal layering density (series 2, image 72). Normal adrenal glands. STOMACH/BOWEL: Moderate sigmoid diverticulosis without acute diverticulitis. Similar  anteriorly directed sigmoid colonic thick-walled outpouching containing gas and stool inseparable from uterus. Similar thick-walled short segment of sigmoid colon. Moderate amount of retained large bowel stool. Small amount of small bowel feces compatible with chronic stasis. Inflamed loop of small bowel within the pelvis, likely reactive. VASCULAR/LYMPHATIC: Stable peripherally calcified 4.5 cm infrarenal aortic aneurysm and 3.1 cm infrarenal aortic aneurysm with severe calcific atherosclerosis. No lymphadenopathy by CT size criteria. REPRODUCTIVE: Normal. OTHER: No intraperitoneal free fluid or free air. MUSCULOSKELETAL: Nonacute. Old mild L4 compression fracture, status post cement augmentation. Osteopenia. IMPRESSION: 1. Chronic sigmoid colonic diverticulitis with short thick-walled segment, neoplasm not excluded. Recommend GI consultation. 2. Similar pericolonic outpouching with gas inseparable from uterus concerning for fistula. 3. Moderate amount of retained large bowel stool without bowel obstruction. 4. Moderate bilateral pleural effusions increased from prior examination. 5. Redemonstration of bilobed infrarenal aortic aneurysm measuring to 4.5 cm. Recommend followup by abdomen and pelvis CTA in 6 months, and vascular surgery referral/consultation if not already obtained. This recommendation follows ACR consensus guidelines: White Paper of the ACR Incidental Findings Committee II on Vascular Findings. J Am Coll Radiol 2013; 10:789-794. Electronically Signed   By: Elon Alas M.D.   On: 11/13/2017 19:45    Procedures Procedures (including critical care time)  Medications Ordered in ED Medications  piperacillin-tazobactam (ZOSYN) IVPB 3.375 g (3.375 g Intravenous New Bag/Given 11/13/17 2133)  polyethylene glycol (MIRALAX / GLYCOLAX) packet 17 g (17 g Oral Given 11/13/17 2156)  acetaminophen (TYLENOL) tablet 650 mg (has no administration in time range)    Or  acetaminophen (TYLENOL)  suppository 650 mg (has no administration in time range)  ondansetron (ZOFRAN) tablet 4 mg (has no administration in time range)    Or  ondansetron (ZOFRAN) injection 4 mg (has no administration in time range)  enoxaparin (LOVENOX) injection 40 mg (has no administration in time range)  metoprolol tartrate (LOPRESSOR) tablet 12.5 mg (has no administration in time range)  famotidine (PEPCID) tablet 20 mg (has no administration in time range)  feeding supplement (ENSURE ENLIVE) (ENSURE ENLIVE) liquid 237 mL (has no administration in time range)  albuterol (PROVENTIL HFA;VENTOLIN HFA) 108 (90 Base) MCG/ACT inhaler 2 puff (has no administration in time range)  morphine 2 MG/ML injection 2 mg (has no administration in time range)  potassium chloride SA (K-DUR,KLOR-CON) CR tablet 20 mEq (has no administration in time range)  pravastatin (PRAVACHOL) tablet 40 mg (has no administration in time range)  morphine 2 MG/ML injection 2 mg (2 mg Intravenous Given 11/13/17 1731)  ondansetron (ZOFRAN) injection 4 mg (4 mg Intravenous Given 11/13/17 1731)  iopamidol (ISOVUE-300) 61 % injection 100 mL (100 mLs Intravenous Contrast Given 11/13/17 1902)     Initial Impression / Assessment and Plan / ED Course  I have reviewed the triage vital signs and the nursing notes.  Pertinent labs &  imaging results that were available during my care of the patient were reviewed by me and considered in my medical decision making (see chart for details).     Pt with chronic smoldering diverticulitis, completed IV zosyn with recent hospitalization, with worsened pain today. Fistula formation unable to r/o communication with uterus.  Leukocytosis.    Plan consult with general surgery with probable general medical admission. Discussed with Dr. Excell Seltzer who will consult, hospitalist admission. Advised restarting zosyn.   Final Clinical Impressions(s) / ED Diagnoses   Final diagnoses:  Acute diverticulitis    ED Discharge  Orders    None       Landis Martins 11/13/17 2208    Malvin Johns, MD 11/14/17 [phone]

## 2017-11-13 NOTE — ED Notes (Signed)
Bed: [facility] Expected date:  Expected time:  Means of arrival:  Comments: 

## 2017-11-13 NOTE — ED Notes (Signed)
Patient requests that no one places tape on the patient.

## 2017-11-13 NOTE — ED Triage Notes (Signed)
From home with hx diverticulitis. Ate fried chicken and sauce today and now her lower abdomen hurts bilaterally. AOx4 with belching and flatulence.

## 2017-11-13 NOTE — Progress Notes (Signed)
ED TO INPATIENT HANDOFF REPORT  Name/Age/Gender Melissa Riddle 84 y.o. female  Code Status    Code Status Orders  (From admission, onward)         Start     Ordered   11/13/17 2154  Full code  Continuous     11/13/17 2155        Code Status History    Date Active Date Inactive Code Status Order ID Comments User Context   10/31/2017 2116 11/08/2017 1624 Full Code [id number]  Etta Quill, DO ED   07/10/2017 1744 07/15/2017 2252 Full Code [id number]  Purohit, Konrad Dolores, MD Inpatient      Home/SNF/Other Home  Chief Complaint abd pain   Level of Care/Admitting Diagnosis ED Disposition    ED Disposition Condition [facility]: [facility] [[id number]]  Level of Care: Med-Surg [16]  Diagnosis: Acute diverticulitis [[id number]]  Admitting Physician: Etta Quill [[id number]]  Attending Physician: Etta Quill [[id number]]  Estimated length of stay: past midnight tomorrow  Certification:: I certify this patient will need inpatient services for at least 2 midnights  PT Class (Do Not Modify): Inpatient [101]  PT Acc Code (Do Not Modify): Private [1]       Medical History Past Medical History:  Diagnosis Date  . Abdominal aneurysm ([place])   . Chronic diarrhea 07/10/2017  . COPD (chronic obstructive pulmonary disease) ([facility])     Allergies No Known Allergies  IV Location/Drains/Wounds Patient Lines/Drains/Airways Status   Active Line/Drains/Airways    Name:   Placement date:   Placement time:   Site:   Days:   Peripheral IV 11/13/17 Right Wrist   11/13/17    2208    Wrist   less than 1   Wound / Incision (Open or Dehisced)  Arm Left;Lower;Posterior   -    -    Arm             Labs/Imaging Results for orders placed or performed during the hospital encounter of 11/13/17 (from the past 48 hour(s))  CBC with Differential     Status: Abnormal   Collection Time: 11/13/17  4:58 PM  Result Value Ref Range   WBC 15.6 (H) 4.0 - 10.5 K/uL   RBC  3.89 3.87 - 5.11 MIL/uL   Hemoglobin 10.9 (L) 12.0 - 15.0 g/dL   HCT 34.5 (L) 36.0 - 46.0 %   MCV 88.7 78.0 - 100.0 fL   MCH 28.0 26.0 - 34.0 pg   MCHC 31.6 30.0 - 36.0 g/dL   RDW 16.8 (H) 11.5 - 15.5 %   Platelets 627 (H) 150 - 400 K/uL   Neutrophils Relative % 83 %   Neutro Abs 13.1 (H) 1.7 - 7.7 K/uL   Lymphocytes Relative 10 %   Lymphs Abs 1.5 0.7 - 4.0 K/uL   Monocytes Relative 6 %   Monocytes Absolute 0.9 0.1 - 1.0 K/uL   Eosinophils Relative 1 %   Eosinophils Absolute 0.1 0.0 - 0.7 K/uL   Basophils Relative 0 %   Basophils Absolute 0.0 0.0 - 0.1 K/uL    Comment: Performed at [facility], [place] [address] [place], [place] [address]  Comprehensive metabolic panel     Status: Abnormal   Collection Time: 11/13/17  4:58 PM  Result Value Ref Range   Sodium 139 135 - 145 mmol/L   Potassium 3.3 (L) 3.5 - 5.1 mmol/L   Chloride 100 98 - 111 mmol/L  CO2 31 22 - 32 mmol/L   Glucose, Bld 104 (H) 70 - 99 mg/dL   BUN 13 8 - 23 mg/dL   Creatinine, Ser 0.68 0.44 - 1.00 mg/dL   Calcium 9.6 8.9 - 10.3 mg/dL   Total Protein 6.0 (L) 6.5 - 8.1 g/dL   Albumin 2.0 (L) 3.5 - 5.0 g/dL   AST 17 15 - 41 U/L   ALT 9 0 - 44 U/L   Alkaline Phosphatase 66 38 - 126 U/L   Total Bilirubin 0.5 0.3 - 1.2 mg/dL   GFR calc non Af Amer >60 >60 mL/min   GFR calc Af Amer >60 >60 mL/min    Comment: (NOTE) The eGFR has been calculated using the CKD EPI equation. This calculation has not been validated in all clinical situations. eGFR's persistently <60 mL/min signify possible Chronic Kidney Disease.    Anion gap 8 5 - 15    Comment: Performed at [facility], [address] [place], [place] [address]  Lipase, blood     Status: None   Collection Time: 11/13/17  4:58 PM  Result Value Ref Range   Lipase 21 11 - 51 U/L    Comment: Performed at [facility], [address] [place], [place] [address]  Urinalysis, Routine w reflex microscopic      Status: Abnormal   Collection Time: 11/13/17  8:47 PM  Result Value Ref Range   Color, Urine YELLOW YELLOW   APPearance HAZY (A) CLEAR   Specific Gravity, Urine 1.006 1.005 - 1.030   pH 8.0 5.0 - 8.0   Glucose, UA NEGATIVE NEGATIVE mg/dL   Hgb urine dipstick NEGATIVE NEGATIVE   Bilirubin Urine NEGATIVE NEGATIVE   Ketones, ur NEGATIVE NEGATIVE mg/dL   Protein, ur NEGATIVE NEGATIVE mg/dL   Nitrite NEGATIVE NEGATIVE   Leukocytes, UA NEGATIVE NEGATIVE    Comment: Performed at [facility], [address] [place], [place] [address]   Ct Abdomen Pelvis W Contrast  Result Date: 11/13/2017 CLINICAL DATA:  Postprandial abdominal pain today. History of abdominal aortic aneurysm. EXAM: CT ABDOMEN AND PELVIS WITH CONTRAST TECHNIQUE: Multidetector CT imaging of the abdomen and pelvis was performed using the standard protocol following bolus administration of intravenous contrast. CONTRAST:  100mL ISOVUE-300 IOPAMIDOL (ISOVUE-300) INJECTION 61% COMPARISON:  CT abdomen and pelvis October 31, 2017 FINDINGS: LOWER CHEST: Moderate bilateral pleural effusions increased from prior examination with compressive atelectasis. Included heart size is normal. No pericardial effusion. HEPATOBILIARY: Status post cholecystectomy. Minimal similar postoperative intrahepatic biliary dilatation. No discrete RIGHT hepatic lesion, prior finding could reflect hemangioma. PANCREAS: Normal. SPLEEN: Calcified granulomas. ADRENALS/URINARY TRACT: Kidneys are orthotopic, demonstrating symmetric enhancement. No hydronephrosis or solid renal masses. Early excretion of contrast resulting in appearance of small nephrolithiasis. The unopacified ureters are normal in course and caliber. Delayed imaging through the kidneys demonstrates symmetric prompt contrast excretion within the proximal urinary collecting system. Urinary bladder is well distended, minimal layering density (series 2, image 72). Normal adrenal glands.  STOMACH/BOWEL: Moderate sigmoid diverticulosis without acute diverticulitis. Similar anteriorly directed sigmoid colonic thick-walled outpouching containing gas and stool inseparable from uterus. Similar thick-walled short segment of sigmoid colon. Moderate amount of retained large bowel stool. Small amount of small bowel feces compatible with chronic stasis. Inflamed loop of small bowel within the pelvis, likely reactive. VASCULAR/LYMPHATIC: Stable peripherally calcified 4.5 cm infrarenal aortic aneurysm and 3.1 cm infrarenal aortic aneurysm with severe calcific atherosclerosis. No lymphadenopathy by CT size criteria. REPRODUCTIVE: Normal. OTHER: No   intraperitoneal free fluid or free air. MUSCULOSKELETAL: Nonacute. Old mild L4 compression fracture, status post cement augmentation. Osteopenia. IMPRESSION: 1. Chronic sigmoid colonic diverticulitis with short thick-walled segment, neoplasm not excluded. Recommend GI consultation. 2. Similar pericolonic outpouching with gas inseparable from uterus concerning for fistula. 3. Moderate amount of retained large bowel stool without bowel obstruction. 4. Moderate bilateral pleural effusions increased from prior examination. 5. Redemonstration of bilobed infrarenal aortic aneurysm measuring to 4.5 cm. Recommend followup by abdomen and pelvis CTA in 6 months, and vascular surgery referral/consultation if not already obtained. This recommendation follows ACR consensus guidelines: White Paper of the ACR Incidental Findings Committee II on Vascular Findings. J Am Coll Radiol 2013; 10:789-794. Electronically Signed   By: Courtnay  Bloomer M.D.   On: 11/13/2017 19:45    Pending Labs Unresulted Labs (From admission, onward)    Start     Ordered   11/14/17 0500  CBC  Tomorrow morning,   R     11/13/17 2155   11/14/17 0500  Basic metabolic panel  Tomorrow morning,   R     11/13/17 2155          Vitals/Pain Today's Vitals   11/13/17 1925 11/13/17 2000 11/13/17 2100  11/13/17 2207  BP:    (!) 150/60  Pulse: 81 83 80 81  Resp:      Temp:      TempSrc:      SpO2: 100% 99% 97% 98%  PainSc:        Isolation Precautions No active isolations  Medications Medications  piperacillin-tazobactam (ZOSYN) IVPB 3.375 g (3.375 g Intravenous New Bag/Given 11/13/17 2133)  polyethylene glycol (MIRALAX / GLYCOLAX) packet 17 g (17 g Oral Given 11/13/17 2156)  acetaminophen (TYLENOL) tablet 650 mg (has no administration in time range)    Or  acetaminophen (TYLENOL) suppository 650 mg (has no administration in time range)  ondansetron (ZOFRAN) tablet 4 mg (has no administration in time range)    Or  ondansetron (ZOFRAN) injection 4 mg (has no administration in time range)  enoxaparin (LOVENOX) injection 40 mg (has no administration in time range)  metoprolol tartrate (LOPRESSOR) tablet 12.5 mg (has no administration in time range)  famotidine (PEPCID) tablet 20 mg (has no administration in time range)  feeding supplement (ENSURE ENLIVE) (ENSURE ENLIVE) liquid 237 mL (has no administration in time range)  albuterol (PROVENTIL HFA;VENTOLIN HFA) 108 (90 Base) MCG/ACT inhaler 2 puff (has no administration in time range)  morphine 2 MG/ML injection 2 mg (has no administration in time range)  potassium chloride SA (K-DUR,KLOR-CON) CR tablet 20 mEq (has no administration in time range)  pravastatin (PRAVACHOL) tablet 40 mg (has no administration in time range)  morphine 2 MG/ML injection 2 mg (2 mg Intravenous Given 11/13/17 1731)  ondansetron (ZOFRAN) injection 4 mg (4 mg Intravenous Given 11/13/17 1731)  iopamidol (ISOVUE-300) 61 % injection 100 mL (100 mLs Intravenous Contrast Given 11/13/17 1902)    Mobility non-ambulatory  

## 2017-11-14 LAB — BASIC METABOLIC PANEL
Anion gap: 7 (ref 5–15)
BUN: 13 mg/dL (ref 8–23)
CHLORIDE: 100 mmol/L (ref 98–111)
CO2: 32 mmol/L (ref 22–32)
CREATININE: 0.68 mg/dL (ref 0.44–1.00)
Calcium: 9.1 mg/dL (ref 8.9–10.3)
Glucose, Bld: 81 mg/dL (ref 70–99)
Potassium: 3.9 mmol/L (ref 3.5–5.1)
SODIUM: 139 mmol/L (ref 135–145)

## 2017-11-14 LAB — CBC
HCT: 30.2 % — ABNORMAL LOW (ref 36.0–46.0)
HEMOGLOBIN: 9.3 g/dL — AB (ref 12.0–15.0)
MCH: 27.5 pg (ref 26.0–34.0)
MCHC: 30.8 g/dL (ref 30.0–36.0)
MCV: 89.3 fL (ref 78.0–100.0)
Platelets: 699 10*3/uL — ABNORMAL HIGH (ref 150–400)
RBC: 3.38 MIL/uL — AB (ref 3.87–5.11)
RDW: 17 % — ABNORMAL HIGH (ref 11.5–15.5)
WBC: 12.3 10*3/uL — ABNORMAL HIGH (ref 4.0–10.5)

## 2017-11-14 NOTE — Consult Note (Addendum)
Reason for Consult: Abdominal pain, diverticulitis Referring Physician: [name] is an 84 y.o. female.  HPI: Patient is an 84 year old female with significant comorbidities readmitted with lower abdominal pain and apparent diverticulitis.  She was discharged from the hospital 5 days prior to this admission.  She reports one previous episode of diverticulitis a couple of years ago.  Recent CT scan at her last admission showed apparent sigmoid diverticulitis with thickening of the sigmoid colon and an outpouching with air against the wall of the uterus, possible walled off perforation or early fistula.  She is not apparently having any unusual vaginal drainage.  At the time of discharge she was still having some pain but improved.  This began to increase soon after discharge.  She represented to the emergency department.  Describes pain across her lower abdomen and suprapubic.  She had a "lump" in her left lower quadrant just prior to discharge which is now gone.  She is feeling a little bit better today after admission last night.  Having some incontinent loose stools.  No blood.  Patient lives at home with care from her daughter and is nonambulatory and spends a lot of days in bed.  Past Medical History:  Diagnosis Date  . Abdominal aneurysm ([place])   . Chronic diarrhea 07/10/2017  . COPD (chronic obstructive pulmonary disease) ([place])     History reviewed. No pertinent surgical history.  History reviewed. No pertinent family history.  Social History:  reports that she has quit smoking. Her smoking use included cigarettes. She has a 105.00 pack-year smoking history. She has never used smokeless tobacco. She reports that she does not drink alcohol or use drugs.  Allergies: No Known Allergies  Current Facility-Administered Medications  Medication Dose Route Frequency Provider Last Rate Last Dose  . acetaminophen (TYLENOL) tablet 650 mg  650 mg Oral Q6H PRN Etta Quill, DO       Or  . acetaminophen (TYLENOL) suppository 650 mg  650 mg Rectal Q6H PRN Etta Quill, DO      . albuterol (PROVENTIL) (2.5 MG/3ML) 0.083% nebulizer solution 3 mL  3 mL Inhalation Daily Jennette Kettle M, DO   3 mL at 11/14/17 0852  . enoxaparin (LOVENOX) injection 40 mg  40 mg Subcutaneous Q24H Jennette Kettle M, DO   40 mg at 11/13/17 2256  . famotidine (PEPCID) tablet 20 mg  20 mg Oral Daily Alcario Drought, Jared M, DO      . feeding supplement (ENSURE ENLIVE) (ENSURE ENLIVE) liquid 237 mL  237 mL Oral TID BM Etta Quill, DO      . metoprolol tartrate (LOPRESSOR) tablet 12.5 mg  12.5 mg Oral BID Jennette Kettle M, DO   12.5 mg at 11/13/17 2257  . morphine 2 MG/ML injection 2 mg  2 mg Intravenous Q4H PRN Etta Quill, DO      . ondansetron [facility]) tablet 4 mg  4 mg Oral Q6H PRN Etta Quill, DO       Or  . ondansetron [facility]) injection 4 mg  4 mg Intravenous Q6H PRN Etta Quill, DO      . piperacillin-tazobactam (ZOSYN) IVPB 3.375 g  3.375 g Intravenous Q8H Dorrene German, RPH 12.5 mL/hr at 11/14/17 0530 3.375 g at 11/14/17 0530  . polyethylene glycol (MIRALAX / GLYCOLAX) packet 17 g  17 g Oral Daily Etta Quill, DO   17 g at 11/13/17 2156  . pravastatin (PRAVACHOL) tablet 40 mg  40 mg  Oral q1800 Etta Quill, DO         Results for orders placed or performed during the hospital encounter of 11/13/17 (from the past 48 hour(s))  CBC with Differential     Status: Abnormal   Collection Time: 11/13/17  4:58 PM  Result Value Ref Range   WBC 15.6 (H) 4.0 - 10.5 K/uL   RBC 3.89 3.87 - 5.11 MIL/uL   Hemoglobin 10.9 (L) 12.0 - 15.0 g/dL   HCT 34.5 (L) 36.0 - 46.0 %   MCV 88.7 78.0 - 100.0 fL   MCH 28.0 26.0 - 34.0 pg   MCHC 31.6 30.0 - 36.0 g/dL   RDW 16.8 (H) 11.5 - 15.5 %   Platelets 627 (H) 150 - 400 K/uL   Neutrophils Relative % 83 %   Neutro Abs 13.1 (H) 1.7 - 7.7 K/uL   Lymphocytes Relative 10 %   Lymphs Abs 1.5 0.7 - 4.0 K/uL   Monocytes Relative 6 %    Monocytes Absolute 0.9 0.1 - 1.0 K/uL   Eosinophils Relative 1 %   Eosinophils Absolute 0.1 0.0 - 0.7 K/uL   Basophils Relative 0 %   Basophils Absolute 0.0 0.0 - 0.1 K/uL    Comment: Performed at [facility], [place] [address] [place], [place] [address]  Comprehensive metabolic panel     Status: Abnormal   Collection Time: 11/13/17  4:58 PM  Result Value Ref Range   Sodium 139 135 - 145 mmol/L   Potassium 3.3 (L) 3.5 - 5.1 mmol/L   Chloride 100 98 - 111 mmol/L   CO2 31 22 - 32 mmol/L   Glucose, Bld 104 (H) 70 - 99 mg/dL   BUN 13 8 - 23 mg/dL   Creatinine, Ser 0.68 0.44 - 1.00 mg/dL   Calcium 9.6 8.9 - 10.3 mg/dL   Total Protein 6.0 (L) 6.5 - 8.1 g/dL   Albumin 2.0 (L) 3.5 - 5.0 g/dL   AST 17 15 - 41 U/L   ALT 9 0 - 44 U/L   Alkaline Phosphatase 66 38 - 126 U/L   Total Bilirubin 0.5 0.3 - 1.2 mg/dL   GFR calc non Af Amer >60 >60 mL/min   GFR calc Af Amer >60 >60 mL/min    Comment: (NOTE) The eGFR has been calculated using the CKD EPI equation. This calculation has not been validated in all clinical situations. eGFR's persistently <60 mL/min signify possible Chronic Kidney Disease.    Anion gap 8 5 - 15    Comment: Performed at [facility], [place] [address] [place], [place] [address]  Lipase, blood     Status: None   Collection Time: 11/13/17  4:58 PM  Result Value Ref Range   Lipase 21 11 - 51 U/L    Comment: Performed at [facility], [place] [address] [place], [place] [address]  Urinalysis, Routine w reflex microscopic     Status: Abnormal   Collection Time: 11/13/17  8:47 PM  Result Value Ref Range   Color, Urine YELLOW YELLOW   APPearance HAZY (A) CLEAR   Specific Gravity, Urine 1.006 1.005 - 1.030   pH 8.0 5.0 - 8.0   Glucose, UA NEGATIVE NEGATIVE mg/dL   Hgb urine dipstick NEGATIVE NEGATIVE   Bilirubin Urine NEGATIVE NEGATIVE   Ketones, ur NEGATIVE NEGATIVE mg/dL   Protein, ur NEGATIVE NEGATIVE mg/dL    Nitrite NEGATIVE NEGATIVE   Leukocytes, UA NEGATIVE NEGATIVE    Comment: Performed  at [facility], [place] [address], [place], [place] [address]  CBC     Status: Abnormal   Collection Time: 11/14/17  4:24 AM  Result Value Ref Range   WBC 12.3 (H) 4.0 - 10.5 K/uL   RBC 3.38 (L) 3.87 - 5.11 MIL/uL   Hemoglobin 9.3 (L) 12.0 - 15.0 g/dL   HCT 30.2 (L) 36.0 - 46.0 %   MCV 89.3 78.0 - 100.0 fL   MCH 27.5 26.0 - 34.0 pg   MCHC 30.8 30.0 - 36.0 g/dL   RDW 17.0 (H) 11.5 - 15.5 %   Platelets 699 (H) 150 - 400 K/uL    Comment: Performed at [facility], [place] [address], [place], [place] [address]  Basic metabolic panel     Status: None   Collection Time: 11/14/17  4:24 AM  Result Value Ref Range   Sodium 139 135 - 145 mmol/L   Potassium 3.9 3.5 - 5.1 mmol/L   Chloride 100 98 - 111 mmol/L   CO2 32 22 - 32 mmol/L   Glucose, Bld 81 70 - 99 mg/dL   BUN 13 8 - 23 mg/dL   Creatinine, Ser 0.68 0.44 - 1.00 mg/dL   Calcium 9.1 8.9 - 10.3 mg/dL   GFR calc non Af Amer >60 >60 mL/min   GFR calc Af Amer >60 >60 mL/min    Comment: (NOTE) The eGFR has been calculated using the CKD EPI equation. This calculation has not been validated in all clinical situations. eGFR's persistently <60 mL/min signify possible Chronic Kidney Disease.    Anion gap 7 5 - 15    Comment: Performed at [facility], [place] [address], [place], [place] [address]    Ct Abdomen Pelvis W Contrast  Result Date: 11/13/2017 CLINICAL DATA:  Postprandial abdominal pain today. History of abdominal aortic aneurysm. EXAM: CT ABDOMEN AND PELVIS WITH CONTRAST TECHNIQUE: Multidetector CT imaging of the abdomen and pelvis was performed using the standard protocol following bolus administration of intravenous contrast. CONTRAST:  157m ISOVUE-300 IOPAMIDOL (ISOVUE-300) INJECTION 61% COMPARISON:  CT abdomen and pelvis October 31, 2017 FINDINGS: LOWER CHEST: Moderate bilateral pleural  effusions increased from prior examination with compressive atelectasis. Included heart size is normal. No pericardial effusion. HEPATOBILIARY: Status post cholecystectomy. Minimal similar postoperative intrahepatic biliary dilatation. No discrete RIGHT hepatic lesion, prior finding could reflect hemangioma. PANCREAS: Normal. SPLEEN: Calcified granulomas. ADRENALS/URINARY TRACT: Kidneys are orthotopic, demonstrating symmetric enhancement. No hydronephrosis or solid renal masses. Early excretion of contrast resulting in appearance of small nephrolithiasis. The unopacified ureters are normal in course and caliber. Delayed imaging through the kidneys demonstrates symmetric prompt contrast excretion within the proximal urinary collecting system. Urinary bladder is well distended, minimal layering density (series 2, image 72). Normal adrenal glands. STOMACH/BOWEL: Moderate sigmoid diverticulosis without acute diverticulitis. Similar anteriorly directed sigmoid colonic thick-walled outpouching containing gas and stool inseparable from uterus. Similar thick-walled short segment of sigmoid colon. Moderate amount of retained large bowel stool. Small amount of small bowel feces compatible with chronic stasis. Inflamed loop of small bowel within the pelvis, likely reactive. VASCULAR/LYMPHATIC: Stable peripherally calcified 4.5 cm infrarenal aortic aneurysm and 3.1 cm infrarenal aortic aneurysm with severe calcific atherosclerosis. No lymphadenopathy by CT size criteria. REPRODUCTIVE: Normal. OTHER: No intraperitoneal free fluid or free air. MUSCULOSKELETAL: Nonacute. Old mild L4 compression fracture, status post cement augmentation. Osteopenia. IMPRESSION: 1. Chronic sigmoid colonic diverticulitis with short thick-walled segment, neoplasm not excluded. Recommend GI consultation. 2. Similar pericolonic outpouching with  gas inseparable from uterus concerning for fistula. 3. Moderate amount of retained large bowel stool without  bowel obstruction. 4. Moderate bilateral pleural effusions increased from prior examination. 5. Redemonstration of bilobed infrarenal aortic aneurysm measuring to 4.5 cm. Recommend followup by abdomen and pelvis CTA in 6 months, and vascular surgery referral/consultation if not already obtained. This recommendation follows ACR consensus guidelines: White Paper of the ACR Incidental Findings Committee II on Vascular Findings. J Am Coll Radiol 2013; 10:789-794. Electronically Signed   By: Elon Alas M.D.   On: 11/13/2017 19:45    Review of Systems  Constitutional: Negative for chills and fever.  Respiratory: Negative.   Cardiovascular: Positive for palpitations and leg swelling. Negative for chest pain.  Gastrointestinal: Positive for abdominal pain. Negative for blood in stool, nausea and vomiting.  Neurological: Positive for weakness.   Blood pressure (!) 164/69, pulse 76, temperature 98.3 F (36.8 C), temperature source Oral, resp. rate 16, height 5' 4"  (1.626 m), weight 50.4 kg, SpO2 94 %. Physical Exam General: Alert, frail elderly Caucasian female, in no distress Skin: Atrophic.  No rash. HEENT: No palpable masses or thyromegaly. Sclera nonicteric. Pupils equal round and reactive.  Lymph nodes: No cervical, supraclavicular,nodes palpable. Lungs: No wheezing or increased work of breathing Cardiovascular: Regular rate and rhythm without murmur.  1+ ankle edema  Abdomen: Nondistended.  Tenderness, moderate, across lower abdomen and in the right mid abdomen.  Some slight fullness in the right mid abdomen.  No masses palpable. No organomegaly. No palpable hernias. Extremities: 1+ lower extremity edema.  No deformity. Neurologic: Alert and fully oriented.  Can move lower extremities but not strong enough to lift off the bed.  Assessment/Plan: Frail 84 year old female with significant comorbidities including COPD, nonambulatory, SVT.  Has area of apparent sigmoid diverticulitis and  probable small walled off perforation against the uterus.  Recent hospitalization for same.  She is somewhat improved since admission with less pain and decreased leukocytosis. If she were in acceptable operative risk she would have indications for surgery.  However I am afraid she would be at very high operative risk with her comorbidities.  I would favor continued nonoperative management.  Cannot rule out malignancy on imaging and flexible sigmoidoscopy may be helpful.  Resection would be last resort if she fails to improve.  We will follow. Discussed with patient's daughter and all her questions answered.  Marland Kitchen T Adisen Bennion 11/14/2017, 10:01 AM

## 2017-11-14 NOTE — Progress Notes (Signed)
PROGRESS NOTE    Melissa Riddle  ALP:[id number] DOB: 13-May-1928 DOA: 11/13/2017 PCP: Orvis Brill, [facility]  Outpatient Specialists:   Brief Narrative:  Patient is an 84 year old female with past medical history significant for COPD, abdominal aortic aneurysm, chronic constipation and diverticulitis that is recurrent.  Patient has been admitted with another episode of diverticulitis.  There are concerns for colo-uterine fistula, as the patient is said to be passing stool via the vagina.  Surgical team is being awaited.   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 Patient is currently on clear liquid diet.  History of constipation: Avoid constipation.  HTN: Continue to monitor and optimize.  SVT last admit:  will leave on the metoprolol started last admit.  DVT prophylaxis: Lovenox Code Status: Full Family Communication: Family at bedside Disposition Plan: Home after admit Consults called: Dr. Excell Seltzer  Procedures:   None  Antimicrobials:   IV Zosyn   Subjective: No new complaints. No fever or chills.  Objective: Vitals:   11/13/17 2207 11/13/17 2250 11/14/17 0504 11/14/17 0852  BP: (!) 150/60 (!) 145/68 (!) 164/69   Pulse: 81 79 76   Resp:  16 16   Temp:  98.1 F (36.7 C) 98.3 F (36.8 C)   TempSrc:  Oral Oral   SpO2: 98% 100% 99% 94%  Weight:  50.4 kg    Height:  5\' 4"  (1.626 m)      Intake/Output Summary (Last 24 hours) at 11/14/2017 1003 Last data filed at 11/14/2017 3532 Gross per 24 hour  Intake 406.65 ml  Output -  Net 406.65 ml   Filed Weights   11/13/17 2250  Weight: 50.4 kg    Examination:  General exam: Cachectic.  Not in any distress.    Respiratory system: Clear to auscultation.  Cardiovascular system: S1 & S2. No pedal  edema. Gastrointestinal system: Abdomen is soft, left lower abdominal quadrant tenderness.   Central nervous system: Alert and oriented. No focal neurological deficits. Extremities: No leg edema.    Data Reviewed: I have personally reviewed following labs and imaging studies  CBC: Recent Labs  Lab 11/13/17 1658 11/14/17 0424  WBC 15.6* 12.3*  NEUTROABS 13.1*  --   HGB 10.9* 9.3*  HCT 34.5* 30.2*  MCV 88.7 89.3  PLT 627* 992*   Basic Metabolic Panel: Recent Labs  Lab 11/07/17 1403 11/13/17 1658 11/14/17 0424  NA  --  139 139  K 3.9 3.3* 3.9  CL  --  100 100  CO2  --  31 32  GLUCOSE  --  104* 81  BUN  --  13 13  CREATININE  --  0.68 0.68  CALCIUM  --  9.6 9.1   GFR: Estimated Creatinine Clearance: 39.4 mL/min (by C-G formula based on SCr of 0.68 mg/dL). Liver Function Tests: Recent Labs  Lab 11/13/17 1658  AST 17  ALT 9  ALKPHOS 66  BILITOT 0.5  PROT 6.0*  ALBUMIN 2.0*   Recent Labs  Lab 11/13/17 1658  LIPASE 21   No results for input(s): AMMONIA in the last 168 hours. Coagulation Profile: No results for input(s): INR, PROTIME in the last 168 hours. Cardiac Enzymes: No results for input(s): CKTOTAL, CKMB, CKMBINDEX, TROPONINI in the last 168 hours. BNP (last 3 results) No results for input(s): PROBNP in the last 8760 hours. HbA1C: No  results for input(s): HGBA1C in the last 72 hours. CBG: No results for input(s): GLUCAP in the last 168 hours. Lipid Profile: No results for input(s): CHOL, HDL, LDLCALC, TRIG, CHOLHDL, LDLDIRECT in the last 72 hours. Thyroid Function Tests: No results for input(s): TSH, T4TOTAL, FREET4, T3FREE, THYROIDAB in the last 72 hours. Anemia Panel: No results for input(s): VITAMINB12, FOLATE, FERRITIN, TIBC, IRON, RETICCTPCT in the last 72 hours. Urine analysis:    Component Value Date/Time   COLORURINE YELLOW 11/13/2017 2047   APPEARANCEUR HAZY (A) 11/13/2017 2047   LABSPEC 1.006 11/13/2017 2047   PHURINE 8.0 11/13/2017  2047   GLUCOSEU NEGATIVE 11/13/2017 2047   HGBUR NEGATIVE 11/13/2017 2047   BILIRUBINUR NEGATIVE 11/13/2017 2047   KETONESUR NEGATIVE 11/13/2017 2047   PROTEINUR NEGATIVE 11/13/2017 2047   NITRITE NEGATIVE 11/13/2017 2047   LEUKOCYTESUR NEGATIVE 11/13/2017 2047   Sepsis Labs: @LABRCNTIP (procalcitonin:4,lacticidven:4)  ) Recent Results (from the past 240 hour(s))  MRSA PCR Screening     Status: Abnormal   Collection Time: 11/06/17  6:31 AM  Result Value Ref Range Status   MRSA by PCR POSITIVE (A) NEGATIVE Final    Comment:        The GeneXpert MRSA Assay (FDA approved for NASAL specimens only), is one component of a comprehensive MRSA colonization surveillance program. It is not intended to diagnose MRSA infection nor to guide or monitor treatment for MRSA infections. RESULT CALLED TO, READ BACK BY AND VERIFIED WITH: DUONG,T @ 3559 RC [id number] BY POTEAT,S Performed at [facility] [address], [place], [place] [address]          Radiology Studies: Ct Abdomen Pelvis W Contrast  Result Date: 11/13/2017 CLINICAL DATA:  Postprandial abdominal pain today. History of abdominal aortic aneurysm. EXAM: CT ABDOMEN AND PELVIS WITH CONTRAST TECHNIQUE: Multidetector CT imaging of the abdomen and pelvis was performed using the standard protocol following bolus administration of intravenous contrast. CONTRAST:  133mL ISOVUE-300 IOPAMIDOL (ISOVUE-300) INJECTION 61% COMPARISON:  CT abdomen and pelvis October 31, 2017 FINDINGS: LOWER CHEST: Moderate bilateral pleural effusions increased from prior examination with compressive atelectasis. Included heart size is normal. No pericardial effusion. HEPATOBILIARY: Status post cholecystectomy. Minimal similar postoperative intrahepatic biliary dilatation. No discrete RIGHT hepatic lesion, prior finding could reflect hemangioma. PANCREAS: Normal. SPLEEN: Calcified granulomas. ADRENALS/URINARY TRACT: Kidneys are orthotopic,  demonstrating symmetric enhancement. No hydronephrosis or solid renal masses. Early excretion of contrast resulting in appearance of small nephrolithiasis. The unopacified ureters are normal in course and caliber. Delayed imaging through the kidneys demonstrates symmetric prompt contrast excretion within the proximal urinary collecting system. Urinary bladder is well distended, minimal layering density (series 2, image 72). Normal adrenal glands. STOMACH/BOWEL: Moderate sigmoid diverticulosis without acute diverticulitis. Similar anteriorly directed sigmoid colonic thick-walled outpouching containing gas and stool inseparable from uterus. Similar thick-walled short segment of sigmoid colon. Moderate amount of retained large bowel stool. Small amount of small bowel feces compatible with chronic stasis. Inflamed loop of small bowel within the pelvis, likely reactive. VASCULAR/LYMPHATIC: Stable peripherally calcified 4.5 cm infrarenal aortic aneurysm and 3.1 cm infrarenal aortic aneurysm with severe calcific atherosclerosis. No lymphadenopathy by CT size criteria. REPRODUCTIVE: Normal. OTHER: No intraperitoneal free fluid or free air. MUSCULOSKELETAL: Nonacute. Old mild L4 compression fracture, status post cement augmentation. Osteopenia. IMPRESSION: 1. Chronic sigmoid colonic diverticulitis with short thick-walled segment, neoplasm not excluded. Recommend GI consultation. 2. Similar pericolonic outpouching with gas inseparable from uterus concerning for fistula. 3. Moderate amount of retained large bowel stool without bowel  obstruction. 4. Moderate bilateral pleural effusions increased from prior examination. 5. Redemonstration of bilobed infrarenal aortic aneurysm measuring to 4.5 cm. Recommend followup by abdomen and pelvis CTA in 6 months, and vascular surgery referral/consultation if not already obtained. This recommendation follows ACR consensus guidelines: White Paper of the ACR Incidental Findings Committee II  on Vascular Findings. J Am Coll Radiol 2013; 10:789-794. Electronically Signed   By: Elon Alas M.D.   On: 11/13/2017 19:45        Scheduled Meds: . albuterol  3 mL Inhalation Daily  . enoxaparin (LOVENOX) injection  40 mg Subcutaneous Q24H  . famotidine  20 mg Oral Daily  . feeding supplement (ENSURE ENLIVE)  237 mL Oral TID BM  . metoprolol tartrate  12.5 mg Oral BID  . polyethylene glycol  17 g Oral Daily  . pravastatin  40 mg Oral q1800   Continuous Infusions: . piperacillin-tazobactam (ZOSYN)  IV 3.375 g (11/14/17 0530)     LOS: 1 day    Time spent: 25 minutes.    Dana Allan, MD  [facility] Pager #: [phone] 7PM-7AM contact night coverage as above

## 2017-11-15 NOTE — Progress Notes (Signed)
Patient ID: Melissa Riddle, female   DOB: [birth year], 84 y.o.   MRN: [id number]     Subjective: Feels better.  Denies pain.  Patient was not aware but discussing with daughter she has been having definite feculent drainage per vagina for a number of weeks.  Objective: Vital signs in last 24 hours: Temp:  [97.8 F (36.6 C)-98.6 F (37 C)] 97.8 F (36.6 C) (09/22 0657) Pulse Rate:  [73-74] 74 (09/22 0657) Resp:  [16-18] 18 (09/22 0657) BP: (125-166)/(52-74) 125/54 (09/22 0657) SpO2:  [93 %-98 %] 97 % (09/22 0854) Last BM Date: 11/14/17  Intake/Output from previous day: 09/21 0701 - 09/22 0700 In: 984.7 [P.O.:840; IV Piggyback:144.7] Out: 550 [Urine:550] Intake/Output this shift: No intake/output data recorded.  General appearance: alert, cooperative, no distress and Thin and somewhat chronically ill-appearing GI: Normal left lower quadrant tenderness with subtle palpable mass  Lab Results:  Recent Labs    11/13/17 1658 11/14/17 0424  WBC 15.6* 12.3*  HGB 10.9* 9.3*  HCT 34.5* 30.2*  PLT 627* 699*   BMET Recent Labs    11/13/17 1658 11/14/17 0424  NA 139 139  K 3.3* 3.9  CL 100 100  CO2 31 32  GLUCOSE 104* 81  BUN 13 13  CREATININE 0.68 0.68  CALCIUM 9.6 9.1     Studies/Results: Ct Abdomen Pelvis W Contrast  Result Date: 11/13/2017 CLINICAL DATA:  Postprandial abdominal pain today. History of abdominal aortic aneurysm. EXAM: CT ABDOMEN AND PELVIS WITH CONTRAST TECHNIQUE: Multidetector CT imaging of the abdomen and pelvis was performed using the standard protocol following bolus administration of intravenous contrast. CONTRAST:  131mL ISOVUE-300 IOPAMIDOL (ISOVUE-300) INJECTION 61% COMPARISON:  CT abdomen and pelvis October 31, 2017 FINDINGS: LOWER CHEST: Moderate bilateral pleural effusions increased from prior examination with compressive atelectasis. Included heart size is normal. No pericardial effusion. HEPATOBILIARY: Status post cholecystectomy. Minimal  similar postoperative intrahepatic biliary dilatation. No discrete RIGHT hepatic lesion, prior finding could reflect hemangioma. PANCREAS: Normal. SPLEEN: Calcified granulomas. ADRENALS/URINARY TRACT: Kidneys are orthotopic, demonstrating symmetric enhancement. No hydronephrosis or solid renal masses. Early excretion of contrast resulting in appearance of small nephrolithiasis. The unopacified ureters are normal in course and caliber. Delayed imaging through the kidneys demonstrates symmetric prompt contrast excretion within the proximal urinary collecting system. Urinary bladder is well distended, minimal layering density (series 2, image 72). Normal adrenal glands. STOMACH/BOWEL: Moderate sigmoid diverticulosis without acute diverticulitis. Similar anteriorly directed sigmoid colonic thick-walled outpouching containing gas and stool inseparable from uterus. Similar thick-walled short segment of sigmoid colon. Moderate amount of retained large bowel stool. Small amount of small bowel feces compatible with chronic stasis. Inflamed loop of small bowel within the pelvis, likely reactive. VASCULAR/LYMPHATIC: Stable peripherally calcified 4.5 cm infrarenal aortic aneurysm and 3.1 cm infrarenal aortic aneurysm with severe calcific atherosclerosis. No lymphadenopathy by CT size criteria. REPRODUCTIVE: Normal. OTHER: No intraperitoneal free fluid or free air. MUSCULOSKELETAL: Nonacute. Old mild L4 compression fracture, status post cement augmentation. Osteopenia. IMPRESSION: 1. Chronic sigmoid colonic diverticulitis with short thick-walled segment, neoplasm not excluded. Recommend GI consultation. 2. Similar pericolonic outpouching with gas inseparable from uterus concerning for fistula. 3. Moderate amount of retained large bowel stool without bowel obstruction. 4. Moderate bilateral pleural effusions increased from prior examination. 5. Redemonstration of bilobed infrarenal aortic aneurysm measuring to 4.5 cm. Recommend  followup by abdomen and pelvis CTA in 6 months, and vascular surgery referral/consultation if not already obtained. This recommendation follows ACR consensus guidelines: White Paper of the ACR Incidental Findings Committee  II on Vascular Findings. J Am Coll Radiol 2013; 10:789-794. Electronically Signed   By: Elon Alas M.D.   On: 11/13/2017 19:45    Anti-infectives: Anti-infectives (From admission, onward)   Start     Dose/Rate Route Frequency Ordered Stop   11/14/17 0600  piperacillin-tazobactam (ZOSYN) IVPB 3.375 g     3.375 g 12.5 mL/hr over 240 Minutes Intravenous Every 8 hours 11/13/17 2225     11/13/17 2115  piperacillin-tazobactam (ZOSYN) IVPB 3.375 g     3.375 g 12.5 mL/hr over 240 Minutes Intravenous  Once 11/13/17 2101 11/14/17 0133      Assessment/Plan: Recurrent sigmoid diverticulitis with colo-uterine fistula in elderly patient with significant medical problems.  Overall this episode of acute diverticulitis appears improved I discussed the situation with the patient's daughter by phone and with the patient who has I believe limited insight.  Discussed options of continued nonoperative management with downside of persistent feculent drainage requiring diapers, difficulty with hygiene, recurrent acute diverticulitis, and risk of urinary infections versus proceeding with colectomy.  If so I would recommend a colostomy as she is bedbound and essentially full care and this would be far easier hygiene and less risk than anastomosis.  I think this likely would be the better option.  They will think this over and we will follow.  I believe they will want to proceed with colectomy.    LOS: 2 days    Edward Jolly 11/15/2017

## 2017-11-15 NOTE — Plan of Care (Signed)
Pt stable with no needs. No changes to note. No s/s of pain or distress. Pt remains incontinent and weak.

## 2017-11-15 NOTE — Progress Notes (Signed)
PROGRESS NOTE    Melissa Riddle  BHA:[id number] DOB: 12-31-1928 DOA: 11/13/2017 PCP: Orvis Brill, [facility]  Outpatient Specialists:   Brief Narrative:  Patient is an 84 year old female with past medical history significant for COPD, abdominal aortic aneurysm, chronic constipation and diverticulitis that is recurrent.  Patient has been admitted with another episode of diverticulitis.  There are concerns for colo-uterine fistula, as the patient is said to be passing stool via the vagina.  Surgical team is being awaited.   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 Patient is currently on clear liquid diet. 11/15/2017: Surgical input is appreciated.  For possible diversion colostomy.  Patient's comorbidities have been considered in dictating the discharge of surgery, as per the surgical team.  History of constipation: Avoid constipation.  HTN: Continue to monitor and optimize.  SVT last admit: Metoprolol  DVT prophylaxis: Lovenox Code Status: Full Family Communication: Family at bedside Disposition Plan: Home after admit Consults called: Dr. Excell Seltzer  Procedures:   None  Antimicrobials:   IV Zosyn   Subjective: No new complaints. No fever or chills. Patient actually looks better today.  Objective: Vitals:   11/15/17 0852 11/15/17 0854 11/15/17 0918 11/15/17 1158  BP:   (!) 119/59 (!) 90/53  Pulse:   77 84  Resp:      Temp:      TempSrc:      SpO2: 97% 97%    Weight:      Height:        Intake/Output Summary (Last 24 hours) at 11/15/2017 1337 Last data filed at 11/15/2017 1005 Gross per 24 hour  Intake 914.07 ml  Output 550 ml  Net 364.07 ml   Filed Weights   11/13/17 2250  Weight: 50.4 kg    Examination:  General exam: Cachectic.  Not in any  distress.    Respiratory system: Clear to auscultation.  Cardiovascular system: S1 & S2. No pedal edema. Gastrointestinal system: Abdomen is soft, left lower abdominal quadrant tenderness.   Central nervous system: Alert and oriented. No focal neurological deficits. Extremities: No leg edema.    Data Reviewed: I have personally reviewed following labs and imaging studies  CBC: Recent Labs  Lab 11/13/17 1658 11/14/17 0424  WBC 15.6* 12.3*  NEUTROABS 13.1*  --   HGB 10.9* 9.3*  HCT 34.5* 30.2*  MCV 88.7 89.3  PLT 627* 973*   Basic Metabolic Panel: Recent Labs  Lab 11/13/17 1658 11/14/17 0424  NA 139 139  K 3.3* 3.9  CL 100 100  CO2 31 32  GLUCOSE 104* 81  BUN 13 13  CREATININE 0.68 0.68  CALCIUM 9.6 9.1   GFR: Estimated Creatinine Clearance: 39.4 mL/min (by C-G formula based on SCr of 0.68 mg/dL). Liver Function Tests: Recent Labs  Lab 11/13/17 1658  AST 17  ALT 9  ALKPHOS 66  BILITOT 0.5  PROT 6.0*  ALBUMIN 2.0*   Recent Labs  Lab 11/13/17 1658  LIPASE 21   No results for input(s): AMMONIA in the last 168 hours. Coagulation Profile: No results for input(s): INR, PROTIME in the last 168 hours. Cardiac Enzymes: No results for input(s): CKTOTAL, CKMB, CKMBINDEX, TROPONINI in the last 168 hours. BNP (last 3 results) No results for input(s): PROBNP in the last 8760 hours. HbA1C: No results for input(s): HGBA1C in the last 72 hours. CBG:  No results for input(s): GLUCAP in the last 168 hours. Lipid Profile: No results for input(s): CHOL, HDL, LDLCALC, TRIG, CHOLHDL, LDLDIRECT in the last 72 hours. Thyroid Function Tests: No results for input(s): TSH, T4TOTAL, FREET4, T3FREE, THYROIDAB in the last 72 hours. Anemia Panel: No results for input(s): VITAMINB12, FOLATE, FERRITIN, TIBC, IRON, RETICCTPCT in the last 72 hours. Urine analysis:    Component Value Date/Time   COLORURINE YELLOW 11/13/2017 2047   APPEARANCEUR HAZY (A) 11/13/2017 2047   LABSPEC  1.006 11/13/2017 2047   PHURINE 8.0 11/13/2017 2047   GLUCOSEU NEGATIVE 11/13/2017 2047   HGBUR NEGATIVE 11/13/2017 2047   BILIRUBINUR NEGATIVE 11/13/2017 2047   KETONESUR NEGATIVE 11/13/2017 2047   PROTEINUR NEGATIVE 11/13/2017 2047   NITRITE NEGATIVE 11/13/2017 2047   LEUKOCYTESUR NEGATIVE 11/13/2017 2047   Sepsis Labs: @LABRCNTIP (procalcitonin:4,lacticidven:4)  ) Recent Results (from the past 240 hour(s))  MRSA PCR Screening     Status: Abnormal   Collection Time: 11/06/17  6:31 AM  Result Value Ref Range Status   MRSA by PCR POSITIVE (A) NEGATIVE Final    Comment:        The GeneXpert MRSA Assay (FDA approved for NASAL specimens only), is one component of a comprehensive MRSA colonization surveillance program. It is not intended to diagnose MRSA infection nor to guide or monitor treatment for MRSA infections. RESULT CALLED TO, READ BACK BY AND VERIFIED WITH: DUONG,T @ [id number] ZM [id number] BY POTEAT,S Performed at [facility] [address], [place], [place] [address]          Radiology Studies: Ct Abdomen Pelvis W Contrast  Result Date: 11/13/2017 CLINICAL DATA:  Postprandial abdominal pain today. History of abdominal aortic aneurysm. EXAM: CT ABDOMEN AND PELVIS WITH CONTRAST TECHNIQUE: Multidetector CT imaging of the abdomen and pelvis was performed using the standard protocol following bolus administration of intravenous contrast. CONTRAST:  137mL ISOVUE-300 IOPAMIDOL (ISOVUE-300) INJECTION 61% COMPARISON:  CT abdomen and pelvis October 31, 2017 FINDINGS: LOWER CHEST: Moderate bilateral pleural effusions increased from prior examination with compressive atelectasis. Included heart size is normal. No pericardial effusion. HEPATOBILIARY: Status post cholecystectomy. Minimal similar postoperative intrahepatic biliary dilatation. No discrete RIGHT hepatic lesion, prior finding could reflect hemangioma. PANCREAS: Normal. SPLEEN: Calcified granulomas.  ADRENALS/URINARY TRACT: Kidneys are orthotopic, demonstrating symmetric enhancement. No hydronephrosis or solid renal masses. Early excretion of contrast resulting in appearance of small nephrolithiasis. The unopacified ureters are normal in course and caliber. Delayed imaging through the kidneys demonstrates symmetric prompt contrast excretion within the proximal urinary collecting system. Urinary bladder is well distended, minimal layering density (series 2, image 72). Normal adrenal glands. STOMACH/BOWEL: Moderate sigmoid diverticulosis without acute diverticulitis. Similar anteriorly directed sigmoid colonic thick-walled outpouching containing gas and stool inseparable from uterus. Similar thick-walled short segment of sigmoid colon. Moderate amount of retained large bowel stool. Small amount of small bowel feces compatible with chronic stasis. Inflamed loop of small bowel within the pelvis, likely reactive. VASCULAR/LYMPHATIC: Stable peripherally calcified 4.5 cm infrarenal aortic aneurysm and 3.1 cm infrarenal aortic aneurysm with severe calcific atherosclerosis. No lymphadenopathy by CT size criteria. REPRODUCTIVE: Normal. OTHER: No intraperitoneal free fluid or free air. MUSCULOSKELETAL: Nonacute. Old mild L4 compression fracture, status post cement augmentation. Osteopenia. IMPRESSION: 1. Chronic sigmoid colonic diverticulitis with short thick-walled segment, neoplasm not excluded. Recommend GI consultation. 2. Similar pericolonic outpouching with gas inseparable from uterus concerning for fistula. 3. Moderate amount of retained large bowel stool without bowel obstruction. 4. Moderate bilateral pleural effusions increased from prior examination.  5. Redemonstration of bilobed infrarenal aortic aneurysm measuring to 4.5 cm. Recommend followup by abdomen and pelvis CTA in 6 months, and vascular surgery referral/consultation if not already obtained. This recommendation follows ACR consensus guidelines: White  Paper of the ACR Incidental Findings Committee II on Vascular Findings. J Am Coll Radiol 2013; 10:789-794. Electronically Signed   By: Elon Alas M.D.   On: 11/13/2017 19:45        Scheduled Meds: . albuterol  3 mL Inhalation Daily  . enoxaparin (LOVENOX) injection  40 mg Subcutaneous Q24H  . famotidine  20 mg Oral Daily  . feeding supplement (ENSURE ENLIVE)  237 mL Oral TID BM  . metoprolol tartrate  12.5 mg Oral BID  . polyethylene glycol  17 g Oral Daily  . pravastatin  40 mg Oral q1800   Continuous Infusions: . piperacillin-tazobactam (ZOSYN)  IV Stopped (11/15/17 1004)     LOS: 2 days    Time spent: 25 minutes.    Dana Allan, MD  [facility] Pager #: [phone] 7PM-7AM contact night coverage as above

## 2017-11-16 ENCOUNTER — Inpatient Hospital Stay: Payer: Self-pay

## 2017-11-16 DIAGNOSIS — E43 Unspecified severe protein-calorie malnutrition: Secondary | ICD-10-CM

## 2017-11-16 LAB — CBC WITH DIFFERENTIAL/PLATELET
Basophils Absolute: 0.1 10*3/uL (ref 0.0–0.1)
Basophils Relative: 1 %
Eosinophils Absolute: 0.2 10*3/uL (ref 0.0–0.7)
Eosinophils Relative: 2 %
HCT: 29.1 % — ABNORMAL LOW (ref 36.0–46.0)
Hemoglobin: 9 g/dL — ABNORMAL LOW (ref 12.0–15.0)
Lymphocytes Relative: 24 %
Lymphs Abs: 2.3 10*3/uL (ref 0.7–4.0)
MCH: 27.8 pg (ref 26.0–34.0)
MCHC: 30.9 g/dL (ref 30.0–36.0)
MCV: 89.8 fL (ref 78.0–100.0)
Monocytes Absolute: 1 10*3/uL (ref 0.1–1.0)
Monocytes Relative: 10 %
Neutro Abs: 6.2 10*3/uL (ref 1.7–7.7)
Neutrophils Relative %: 63 %
Platelets: 624 10*3/uL — ABNORMAL HIGH (ref 150–400)
RBC: 3.24 MIL/uL — ABNORMAL LOW (ref 3.87–5.11)
RDW: 16.9 % — ABNORMAL HIGH (ref 11.5–15.5)
WBC: 9.8 10*3/uL (ref 4.0–10.5)

## 2017-11-16 LAB — RENAL FUNCTION PANEL
Albumin: 1.7 g/dL — ABNORMAL LOW (ref 3.5–5.0)
Anion gap: 7 (ref 5–15)
BUN: 13 mg/dL (ref 8–23)
CO2: 32 mmol/L (ref 22–32)
Calcium: 8.9 mg/dL (ref 8.9–10.3)
Chloride: 100 mmol/L (ref 98–111)
Creatinine, Ser: 0.88 mg/dL (ref 0.44–1.00)
GFR calc Af Amer: >60 mL/min (ref >60)
GFR calc non Af Amer: 57 mL/min — ABNORMAL LOW (ref >60)
Glucose, Bld: 79 mg/dL (ref 70–99)
Phosphorus: 3.1 mg/dL (ref 2.5–4.6)
Potassium: 4.9 mmol/L (ref 3.5–5.1)
Sodium: 139 mmol/L (ref 135–145)

## 2017-11-16 LAB — MAGNESIUM: Magnesium: 2.4 mg/dL (ref 1.7–2.4)

## 2017-11-16 LAB — PREALBUMIN: PREALBUMIN: 5.7 mg/dL — AB (ref 18–38)

## 2017-11-16 MED ORDER — SODIUM CHLORIDE 0.9% FLUSH
10.0000 mL | INTRAVENOUS | Status: DC | PRN
  Administered 2017-11-16 – 2017-11-17 (×2): 10 mL
  Administered 2017-11-20: 20 mL
  Administered 2017-11-26: 10 mL
  Filled 2017-11-16 (×4): qty 40

## 2017-11-16 MED ORDER — ZINC OXIDE 40 % EX OINT
TOPICAL_OINTMENT | Freq: Four times a day (QID) | CUTANEOUS | Status: DC | PRN
  Filled 2017-11-16: qty 57

## 2017-11-16 MED ORDER — TRAVASOL 10 % IV SOLN
INTRAVENOUS | Status: AC
  Administered 2017-11-16: 17:00:00 via INTRAVENOUS
  Filled 2017-11-16: qty 374.4

## 2017-11-16 MED ORDER — INSULIN ASPART 100 UNIT/ML ~~LOC~~ SOLN
0.0000 [IU] | Freq: Three times a day (TID) | SUBCUTANEOUS | Status: DC
  Administered 2017-11-17 – 2017-11-18 (×3): 2 [IU] via SUBCUTANEOUS

## 2017-11-16 NOTE — Progress Notes (Signed)
Peripherally Inserted Central Catheter/Midline Placement  The IV Nurse has discussed with the patient and/or persons authorized to consent for the patient, the purpose of this procedure and the potential benefits and risks involved with this procedure.  The benefits include less needle sticks, lab draws from the catheter, and the patient may be discharged home with the catheter. Risks include, but not limited to, infection, bleeding, blood clot (thrombus formation), and puncture of an artery; nerve damage and irregular heartbeat and possibility to perform a PICC exchange if needed/ordered by physician.  Alternatives to this procedure were also discussed.  Bard Power PICC patient education guide, fact sheet on infection prevention and patient information card has been provided to patient /or left at bedside.    PICC/Midline Placement Documentation  PICC Double Lumen 11/16/17 PICC Right Brachial 32 cm 0 cm (Active)  Indication for Insertion or Continuance of Line Administration of hyperosmolar/irritating solutions (i.e. TPN, Vancomycin, etc.) 11/16/2017  4:21 PM  Exposed Catheter (cm) 0 cm 11/16/2017  4:21 PM  Site Assessment Clean;Dry;Intact 11/16/2017  4:21 PM  Lumen #1 Status Flushed;Blood return noted 11/16/2017  4:21 PM  Lumen #2 Status Flushed;Blood return noted 11/16/2017  4:21 PM  Dressing Type Transparent 11/16/2017  4:21 PM  Dressing Status Clean;Dry;Intact;Antimicrobial disc in place 11/16/2017  4:21 PM  Dressing Intervention New dressing 11/16/2017  4:21 PM  Dressing Change Due 11/23/17 11/16/2017  4:21 PM    Telephone consent signed by daughter   Synthia Innocent 11/16/2017, 4:22 PM

## 2017-11-16 NOTE — Progress Notes (Signed)
Palliative:  I went to visit with Ms. Claybon Jabs but she is sleeping and RN was on phone with daughter. I spoke with daughter who seems to understand the risks of surgery and that there are no great options for her mother at this point. She was able to verbalize risk of infection and decline without surgical intervention but also recognizes the risks of surgery and continued decline post-op given age and fragility. She says her mother has pneumonia every year and struggles with recovery. Ms. Claybon Jabs has a husband 49 yo living in [place] but she has recently been living in [place] with her daughter to have more care and assistance. I introduced palliative care to daughter and my hopes for further [place] conversation especially her wishes if she were to decline or have further complication after surgery (appears they are mostly decided to proceed with surgery). Daughter believes this conversation should happen with myself and patient and says "I trust you" and did not feel it necessary to be present but I will keep her updated. She privately told me that her mother also is heavily reliant on her husband for making decisions so she felt I should speak with her mother first to assess her mother's own wishes and opinions.   Unfortunately I went to awaken patient to have conversation and she is in process for PICC placement. I will follow up in the morning for [place] conversation.   Vinie Sill, NP Palliative Medicine Team Pager # [phone] (M-F 8a-5p) Team Phone # [phone] (Nights/Weekends)

## 2017-11-16 NOTE — Progress Notes (Signed)
PHARMACY - ADULT TOTAL PARENTERAL NUTRITION CONSULT NOTE   Pharmacy Consult for TPN Indication: severe malnutrition  Patient Measurements: Height: 5\' 4"  (162.6 cm) Weight: 111 lb 1.8 oz (50.4 kg) IBW/kg (Calculated) : 54.7 TPN AdjBW (KG): 50.4 Body mass index is 19.07 kg/m. Usual Weight: 50.4 kg  Insulin Requirements: No hx of DM, no insulin use   Current Nutrition: thin liquid diet   IVF: none  Central access: 9/23 line placement ordered TPN start date:  9/23   ASSESSMENT                                                                                                          HPI: Recurrent sigmoid diverticulitis with colo-uterine fistula in elderly patient with significant medical problems.    Significant events:   Today:    Glucose - WNL  Electrolytes - K+ 4.9, WNL but trending up, Magnesium 2.4, CorrCa is 10.7, elevated   Renal - WNL  LFTs - WNL  TGs - pending   Prealbumin - pending   NUTRITIONAL GOALS                                                                                             RD recs:Pending   Calculated target:   Target Kcal 1300-1500 Kcal/day Protein: 70-85 grams/day Fluid: 1.5 L/day     PLAN                                                                                                                         At 1800 today:  Start TPN at 30 ml/hr. No Magnesium, calcium in bag. Decreased Potassium to 20 meq/L   Plan to advance as tolerated to the goal rate.  TPN to contain standard multivitamins and trace elements.  Add sensitive SSI q8h   TPN lab panels on Mondays & Thursdays.  F/u daily.   Royetta Asal, PharmD, BCPS Pager [phone] 11/16/2017 11:21 AM

## 2017-11-16 NOTE — Progress Notes (Signed)
Initial Nutrition Assessment  DOCUMENTATION CODES:   Severe malnutrition in context of chronic illness  INTERVENTION:   TPN per Pharmacy  While patient is on a diet, continue Ensure Enlive po BID, each supplement provides 350 kcal and 20 grams of protein  NUTRITION DIAGNOSIS:   Severe Malnutrition related to chronic illness(recurrent diverticulitis now with a colo-uterine fistula) as evidenced by severe fat depletion, severe muscle depletion, energy intake < 75% for > 7 days.  GOAL:   Patient will meet greater than or equal to 90% of their needs  MONITOR:   Diet advancement, Labs, Weight trends, I & O's, Supplement acceptance, PO intake(TPN)  REASON FOR ASSESSMENT:   Consult New TPN/TNA  ASSESSMENT:    84 year old female with past medical history significant for COPD, abdominal aortic aneurysm, chronic constipation and diverticulitis that is recurrent.  Patient has been admitted with another episode of diverticulitis.   Patient in room with daughter at bedside. Pt very sleepy but able to answer my questions. Pt with chronic constipation and recurrent diverticulitis. Now with colo-uterine fistula. Pt on a clear liquid diet but was drinking a Ensure supplement during visit. Pt's daughter states she also ate some jello.   TPN to start and pt and family deciding plan for management of fistula.   Per daughter, pt was eating much better, 3 meals a day with plenty of protein foods and vegetables. Pt started to not eat as well ~ 1 month ago. Pt started to drink Ensure supplements about a week ago for nutrition.   Per weight records, pt has lost 7 lb since 5/17 (6% wt loss x 4 months, insignificant for time frame).  Medications: Miralax packet daily Labs reviewed: Mg WNL   NUTRITION - FOCUSED PHYSICAL EXAM:    Most Recent Value  Orbital Region  No depletion  Upper Arm Region  Severe depletion  Thoracic and Lumbar Region  Unable to assess  Buccal Region  Mild depletion   Temple Region  Moderate depletion  Clavicle Bone Region  Severe depletion  Clavicle and Acromion Bone Region  Severe depletion  Scapular Bone Region  Severe depletion  Dorsal Hand  Severe depletion  Patellar Region  Unable to assess  Anterior Thigh Region  Unable to assess  Posterior Calf Region  Unable to assess  Edema (RD Assessment)  Mild       Diet Order:   Diet Order            Diet clear liquid Room service appropriate? Yes; Fluid consistency: Thin  Diet effective now              EDUCATION NEEDS:   Education needs have been addressed  Skin:  Skin Assessment: Reviewed RN Assessment  Last BM:  9/22  Height:   Ht Readings from Last 1 Encounters:  11/13/17 5\' 4"  (1.626 m)    Weight:   Wt Readings from Last 1 Encounters:  11/13/17 50.4 kg    Ideal Body Weight:  54.5 kg  BMI:  Body mass index is 19.07 kg/m.  Estimated Nutritional Needs:   Kcal:  1500-1700  Protein:  60-70g  Fluid:  1.7L/day  Clayton Bibles, MS, RD, LDN [place] [facility] Pager: [phone] After Hours Pager: [phone]

## 2017-11-16 NOTE — Progress Notes (Addendum)
PROGRESS NOTE    Melissa Riddle  DXI:[id number] DOB: 07-04-1928 DOA: 11/13/2017 PCP: Or[facility]  Outpatient Specialists:   Brief Narrative:  Patient is an 84 year old female with past medical history significant for COPD, abdominal aortic aneurysm, chronic constipation, recurrent diverticulitis. Patient has been admitted with another episode of diverticulitis with concerns for colo-uterine fistula, as the patient is said to be passing stool via the vagina. Patient admitted for further management. Surgical team on board.   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 Rec palliative consult for [place] discussion prior to invasive procedure Palliative on board, appreciate recs Continue IV Zosyn Started TPN to build up nutrition per surgery  HTN Stable  Normocytic anemia likely due to malnutrition Hgb at baseline Anemia panel pending  Hx of SVT Continue Metoprolol  Malnutrition Dietician on board TPN recommended by Gen surg    DVT prophylaxis: Lovenox Code Status: Full Family Communication: None at bedside Disposition Plan: Once work up complete Consults called: General surgery  Procedures:   None  Antimicrobials:   IV Zosyn   Subjective: Pt denies any abdominal pain, N/V, fever/chills, chest pain, continues to pass stool via vagina  Objective: Vitals:   11/16/17 0700 11/16/17 0814 11/16/17 1026 11/16/17 1331  BP:   (!) 100/47 (!) 120/58  Pulse:   96 82  Resp:    (!) 22  Temp:    98.4 F (36.9 C)  TempSrc:    Oral  SpO2: 98% 98%  99%  Weight:      Height:        Intake/Output Summary (Last 24 hours) at 11/16/2017 1555 Last data filed at 11/16/2017 1000 Gross  per 24 hour  Intake 1208.6 ml  Output 200 ml  Net 1008.6 ml   Filed Weights   11/13/17 2250  Weight: 50.4 kg    Examination:  General exam: Cachectic, chronically ill appearing, NAD Respiratory system: CTAB Cardiovascular system: S1 & S2 present Gastrointestinal system: Abdomen is soft, very mild LLQ tenderness, ND, BS present  Central nervous system: Alert and oriented. No focal neurological deficits noted Extremities: No bilateral pedal edema  Data Reviewed: I have personally reviewed following labs and imaging studies  CBC: Recent Labs  Lab 11/13/17 1658 11/14/17 0424 11/16/17 0450  WBC 15.6* 12.3* 9.8  NEUTROABS 13.1*  --  6.2  HGB 10.9* 9.3* 9.0*  HCT 34.5* 30.2* 29.1*  MCV 88.7 89.3 89.8  PLT 627* 699* 767*   Basic Metabolic Panel: Recent Labs  Lab 11/13/17 1658 11/14/17 0424 11/16/17 0450  NA 139 139 139  K 3.3* 3.9 4.9  CL 100 100 100  CO2 31 32 32  GLUCOSE 104* 81 79  BUN 13 13 13   CREATININE 0.68 0.68 0.88  CALCIUM 9.6 9.1 8.9  MG  --   --  2.4  PHOS  --   --  3.1   GFR: Estimated Creatinine Clearance: 35.8 mL/min (by C-G formula based on SCr of 0.88 mg/dL). Liver Function Tests: Recent Labs  Lab 11/13/17 1658 11/16/17 0450  AST 17  --   ALT 9  --   ALKPHOS 66  --   BILITOT 0.5  --   PROT 6.0*  --   ALBUMIN 2.0* 1.7*   Recent Labs  Lab  11/13/17 1658  LIPASE 21   No results for input(s): AMMONIA in the last 168 hours. Coagulation Profile: No results for input(s): INR, PROTIME in the last 168 hours. Cardiac Enzymes: No results for input(s): CKTOTAL, CKMB, CKMBINDEX, TROPONINI in the last 168 hours. BNP (last 3 results) No results for input(s): PROBNP in the last 8760 hours. HbA1C: No results for input(s): HGBA1C in the last 72 hours. CBG: No results for input(s): GLUCAP in the last 168 hours. Lipid Profile: No results for input(s): CHOL, HDL, LDLCALC, TRIG, CHOLHDL, LDLDIRECT in the last 72 hours. Thyroid Function Tests: No  results for input(s): TSH, T4TOTAL, FREET4, T3FREE, THYROIDAB in the last 72 hours. Anemia Panel: No results for input(s): VITAMINB12, FOLATE, FERRITIN, TIBC, IRON, RETICCTPCT in the last 72 hours. Urine analysis:    Component Value Date/Time   COLORURINE YELLOW 11/13/2017 2047   APPEARANCEUR HAZY (A) 11/13/2017 2047   LABSPEC 1.006 11/13/2017 2047   PHURINE 8.0 11/13/2017 2047   GLUCOSEU NEGATIVE 11/13/2017 2047   HGBUR NEGATIVE 11/13/2017 2047   BILIRUBINUR NEGATIVE 11/13/2017 2047   KETONESUR NEGATIVE 11/13/2017 2047   PROTEINUR NEGATIVE 11/13/2017 2047   NITRITE NEGATIVE 11/13/2017 2047   LEUKOCYTESUR NEGATIVE 11/13/2017 2047   Sepsis Labs: @LABRCNTIP (procalcitonin:4,lacticidven:4)  ) No results found for this or any previous visit (from the past 240 hour(s)).       Radiology Studies: [place] Ekg Site Rite  Result Date: 11/16/2017 If [facility] image not attached, placement could not be confirmed due to current cardiac rhythm.       Scheduled Meds: . albuterol  3 mL Inhalation Daily  . enoxaparin (LOVENOX) injection  40 mg Subcutaneous Q24H  . feeding supplement (ENSURE ENLIVE)  237 mL Oral TID BM  . [START ON 11/17/2017] insulin aspart  0-9 Units Subcutaneous Q8H  . metoprolol tartrate  12.5 mg Oral BID  . polyethylene glycol  17 g Oral Daily  . pravastatin  40 mg Oral q1800   Continuous Infusions: . piperacillin-tazobactam (ZOSYN)  IV 3.375 g (11/16/17 1349)  . TPN ADULT (ION)       LOS: 3 days    Time spent: 30 minutes.    Roslyn Smiling, MD  [facility] 7PM-7AM contact night coverage as above

## 2017-11-16 NOTE — Progress Notes (Signed)
CC: Recurrent diverticulitis  Subjective: Patient actually says she feels better today.  She is not having pain.  She is not really aware of vaginal drainage she says it is in the diaper, and the nurses are changing that for her.  Currently there is no depends garment in place and no visible drainage.  Objective: Vital signs in last 24 hours: Temp:  [98 F (36.7 C)-98.3 F (36.8 C)] 98.1 F (36.7 C) (09/23 0439) Pulse Rate:  [73-84] 73 (09/23 0439) Resp:  [18-26] 18 (09/23 0439) BP: (90-139)/(53-75) 131/54 (09/23 0439) SpO2:  [91 %-99 %] 98 % (09/23 0814) Last BM Date: 11/15/17 1080 PO 194 IV 200 urine recorded Stool - 0  Afebrile, VSS WBC down to 9.8 H/H stable, BMP OK checking prealbumin CT 9/20:: Moderate sigmoid diverticulosis without acute diverticulitis, sigmoid colonic wall thickening outpouching containing gas and stool inseparable from the uterus similar thick-walled short segment of sigmoid colon.  Small amount of bowel feces compatible with chronic stasis moderate amount of retained large bowel stool.  Inflamed small bowel loop present also. 4.5 cm infrarenal bilobed abdominal aortic aneurysm CXR 9/12: Small bilateral effusions with left basilar atelectasis/infiltrate Intake/Output from previous day.  Vascular congestion with mild interstitial hazy edema 09/22 0701 - 09/23 0700 In: 1272.4 [P.O.:1080; IV Piggyback:192.4] Out: 200 [Urine:200] Intake/Output this shift: No intake/output data recorded.  General appearance: alert, cooperative and no distress Resp: clear to auscultation bilaterally GI: soft, non-tender; bowel sounds normal; no masses,  no organomegaly  Lab Results:  Recent Labs    11/14/17 0424 11/16/17 0450  WBC 12.3* 9.8  HGB 9.3* 9.0*  HCT 30.2* 29.1*  PLT 699* 624*    BMET Recent Labs    11/14/17 0424 11/16/17 0450  NA 139 139  K 3.9 4.9  CL 100 100  CO2 32 32  GLUCOSE 81 79  BUN 13 13  CREATININE 0.68 0.88  CALCIUM 9.1 8.9    PT/INR No results for input(s): LABPROT, INR in the last 72 hours.  Recent Labs  Lab 11/13/17 1658 11/16/17 0450  AST 17  --   ALT 9  --   ALKPHOS 66  --   BILITOT 0.5  --   PROT 6.0*  --   ALBUMIN 2.0* 1.7*     Lipase     Component Value Date/Time   LIPASE 21 11/13/2017 1658     Medications: . albuterol  3 mL Inhalation Daily  . enoxaparin (LOVENOX) injection  40 mg Subcutaneous Q24H  . famotidine  20 mg Oral Daily  . feeding supplement (ENSURE ENLIVE)  237 mL Oral TID BM  . metoprolol tartrate  12.5 mg Oral BID  . polyethylene glycol  17 g Oral Daily  . pravastatin  40 mg Oral q1800   . piperacillin-tazobactam (ZOSYN)  IV 12.5 mL/hr at 11/16/17 0532   Anti-infectives (From admission, onward)   Start     Dose/Rate Route Frequency Ordered Stop   11/14/17 0600  piperacillin-tazobactam (ZOSYN) IVPB 3.375 g     3.375 g 12.5 mL/hr over 240 Minutes Intravenous Every 8 hours 11/13/17 2225     11/13/17 2115  piperacillin-tazobactam (ZOSYN) IVPB 3.375 g     3.375 g 12.5 mL/hr over 240 Minutes Intravenous  Once 11/13/17 2101 11/14/17 0133      Assessment/Plan COPD/ > 100 pack years/on Home O2 Abdominal aortic aneurysm Hypertension Hx SVT - metoprolol  Hx FTT - Wheelchair to bed/some walking in room with walker Moderate malnutrition Chronic constipation  Recurrent diverticulitis with colo-uterine fistula - draining stool vaginally  FEN:  No IV fl;uids listed/clear liquids ID:  Zosyn DVT:  Lovneox Follow up:  TBD Foley:  In   Plan: She has been seen and evaluated by Dr. Barry Dienes.  We have ordered TNA to help with her malnutrition.  Dr. Barry Dienes gave the patient 3 options.  The first would be to do no surgery and allow her to drain through the vagina as she is currently doing.  The second option would be a colon resection with colostomy.  Attempt to close the fistula with a possible hysterectomy.  This would be the largest operation.  The third option would be to do  a diverting ostomy laparoscopically.  This would aid in the healing of the fistula without the larger operation.  It would take a month or more for this to close.  We are also getting Palliative to see the patient.  Currently the patient lives with her daughter.  They are also taking care of her 73 year old husband at her home.         LOS: 3 days    Tamyrah Burbage 11/16/2017 [phone]

## 2017-11-17 DIAGNOSIS — Z7189 Other specified counseling: Secondary | ICD-10-CM

## 2017-11-17 DIAGNOSIS — N824 Other female intestinal-genital tract fistulae: Secondary | ICD-10-CM

## 2017-11-17 DIAGNOSIS — Z515 Encounter for palliative care: Secondary | ICD-10-CM

## 2017-11-17 LAB — RENAL FUNCTION PANEL
Albumin: 1.7 g/dL — ABNORMAL LOW (ref 3.5–5.0)
Anion gap: 6 (ref 5–15)
BUN: 14 mg/dL (ref 8–23)
CO2: 31 mmol/L (ref 22–32)
Calcium: 8.3 mg/dL — ABNORMAL LOW (ref 8.9–10.3)
Chloride: 101 mmol/L (ref 98–111)
Creatinine, Ser: 0.81 mg/dL (ref 0.44–1.00)
GFR calc Af Amer: >60 mL/min (ref >60)
GFR calc non Af Amer: >60 mL/min (ref >60)
Glucose, Bld: 100 mg/dL — ABNORMAL HIGH (ref 70–99)
Phosphorus: 2.8 mg/dL (ref 2.5–4.6)
Potassium: 3.2 mmol/L — ABNORMAL LOW (ref 3.5–5.1)
Sodium: 138 mmol/L (ref 135–145)

## 2017-11-17 LAB — CBC
HCT: 26.3 % — ABNORMAL LOW (ref 36.0–46.0)
Hemoglobin: 8.4 g/dL — ABNORMAL LOW (ref 12.0–15.0)
MCH: 28.4 pg (ref 26.0–34.0)
MCHC: 31.9 g/dL (ref 30.0–36.0)
MCV: 88.9 fL (ref 78.0–100.0)
PLATELETS: 559 10*3/uL — AB (ref 150–400)
RBC: 2.96 MIL/uL — ABNORMAL LOW (ref 3.87–5.11)
RDW: 16.7 % — ABNORMAL HIGH (ref 11.5–15.5)
WBC: 12.4 10*3/uL — ABNORMAL HIGH (ref 4.0–10.5)

## 2017-11-17 LAB — TRIGLYCERIDES: Triglycerides: 73 mg/dL (ref <150)

## 2017-11-17 LAB — IRON AND TIBC
Iron: 24 ug/dL — ABNORMAL LOW (ref 28–170)
SATURATION RATIOS: 24 % (ref 10.4–31.8)
TIBC: 100 ug/dL — AB (ref 250–450)
UIBC: 76 ug/dL

## 2017-11-17 LAB — COMPREHENSIVE METABOLIC PANEL
ALT: 7 U/L (ref 0–44)
AST: 14 U/L — AB (ref 15–41)
Albumin: 1.7 g/dL — ABNORMAL LOW (ref 3.5–5.0)
Alkaline Phosphatase: 50 U/L (ref 38–126)
Anion gap: 7 (ref 5–15)
BUN: 16 mg/dL (ref 8–23)
CHLORIDE: 101 mmol/L (ref 98–111)
CO2: 31 mmol/L (ref 22–32)
Calcium: 8.5 mg/dL — ABNORMAL LOW (ref 8.9–10.3)
Creatinine, Ser: 0.8 mg/dL (ref 0.44–1.00)
GFR calc Af Amer: >60 mL/min (ref >60)
GFR calc non Af Amer: >60 mL/min (ref >60)
Glucose, Bld: 99 mg/dL (ref 70–99)
POTASSIUM: 3.2 mmol/L — AB (ref 3.5–5.1)
Sodium: 139 mmol/L (ref 135–145)
TOTAL PROTEIN: 5.4 g/dL — AB (ref 6.5–8.1)
Total Bilirubin: 0.5 mg/dL (ref 0.3–1.2)

## 2017-11-17 LAB — PHOSPHORUS: PHOSPHORUS: 2.6 mg/dL (ref 2.5–4.6)

## 2017-11-17 LAB — VITAMIN B12: VITAMIN B 12: 417 pg/mL (ref 180–914)

## 2017-11-17 LAB — DIFFERENTIAL
Basophils Absolute: 0 10*3/uL (ref 0.0–0.1)
Basophils Relative: 0 %
EOS PCT: 1 %
Eosinophils Absolute: 0.2 10*3/uL (ref 0.0–0.7)
LYMPHS ABS: 2 10*3/uL (ref 0.7–4.0)
Lymphocytes Relative: 16 %
MONO ABS: 1.2 10*3/uL — AB (ref 0.1–1.0)
Monocytes Relative: 9 %
NEUTROS PCT: 74 %
Neutro Abs: 9 10*3/uL — ABNORMAL HIGH (ref 1.7–7.7)

## 2017-11-17 LAB — GLUCOSE, CAPILLARY
GLUCOSE-CAPILLARY: 155 mg/dL — AB (ref 70–99)
Glucose-Capillary: 114 mg/dL — ABNORMAL HIGH (ref 70–99)
Glucose-Capillary: 195 mg/dL — ABNORMAL HIGH (ref 70–99)

## 2017-11-17 LAB — PREALBUMIN: PREALBUMIN: 5.2 mg/dL — AB (ref 18–38)

## 2017-11-17 LAB — MAGNESIUM: MAGNESIUM: 2.2 mg/dL (ref 1.7–2.4)

## 2017-11-17 LAB — FERRITIN: Ferritin: 157 ng/mL (ref 11–307)

## 2017-11-17 LAB — FOLATE: FOLATE: 7.1 ng/mL (ref >5.9)

## 2017-11-17 MED ORDER — TRAVASOL 10 % IV SOLN
INTRAVENOUS | Status: AC
  Administered 2017-11-17: 18:00:00 via INTRAVENOUS
  Filled 2017-11-17: qty 720

## 2017-11-17 MED ORDER — POTASSIUM CHLORIDE 10 MEQ/50ML IV SOLN
10.0000 meq | INTRAVENOUS | Status: AC
  Administered 2017-11-17 (×3): 10 meq via INTRAVENOUS
  Filled 2017-11-17 (×3): qty 50

## 2017-11-17 NOTE — Progress Notes (Signed)
Pharmacy Antibiotic Note  Melissa Riddle is a 84 y.o. female admitted on 11/13/2017 with intra-abdominal infection.  Pharmacy has been consulted for zosyn dosing.  Plan: Continue Zosyn 3.375 gr IV q8h  Monitor clinical course, renal function, cultures as available   Dosage will likely  remain stable at above dosage and need for further dosage adjustment appears unlikely at present.    Will sign off at this time.  Please reconsult if a change in clinical status warrants re-evaluation of dosage.     Height: 5\' 4"  (162.6 cm) Weight: 111 lb 1.8 oz (50.4 kg) IBW/kg (Calculated) : 54.7  Temp (24hrs), Avg:98.7 F (37.1 C), Min:98.4 F (36.9 C), Max:99.2 F (37.3 C)  Recent Labs  Lab 11/13/17 1658 11/14/17 0424 11/16/17 0450 11/17/17 0338  WBC 15.6* 12.3* 9.8 12.4*  CREATININE 0.68 0.68 0.88 0.80  0.81    Estimated Creatinine Clearance: 38.9 mL/min (by C-G formula based on SCr of 0.81 mg/dL).    No Known Allergies  Antimicrobials this admission: 9/20 zosyn >>   Dose adjustments this admission:   Microbiology results:  BCx:   UCx:    Sputum:    MRSA PCR:   Thank you for allowing pharmacy to be a part of this patient's care.  Royetta Asal, PharmD, BCPS Pager [phone] 11/17/2017 9:45 AM

## 2017-11-17 NOTE — Consult Note (Signed)
Consultation Note Date: 11/17/2017   Patient Name: Melissa Riddle  DOB: [birth year]  MRN: [id number]  Age / Sex: 84 y.o., female  PCP: [facility] Referring Physician: Alma Friendly, MD  Reason for Consultation: Establishing goals of care  HPI/Patient Profile: 84 y.o. female  with past medical history of COPD, AAA, chronic constipation, ongoing diverticulitis admitted on 11/13/2017 with abd pain and found to have ongoing diverticulitis as well as new colovaginal fistula.   Clinical Assessment and Goals of Care: I met today with Melissa Riddle. She is very sleepy but pleasant and awakens to speak with me. She appears alert and oriented during our conversation. We discussed surgery vs conservative treatment and she says that she and her family are interested in pursuing surgery. She acknowledges that surgery is risky but is willing to take this risk as she believes this is her best chance to improve her quantity and quality of life. We discussed poor nutrition and weakness and this may make recovery from surgery extremely difficult - she understands.   I spoke more with Melissa Riddle regarding complications/decline after surgery. We spoke about resuscitation and she says that she would want resuscitation "if I have even a chance" but in the case she continues to decline and she would not want resuscitation if we do not believe this will "give her a chance." She reports that she has known people that have lived through this and also people that have died very quickly but at this point she would want to try. This will need further discussion especially if she does poorly after surgery. She also confirms that she would not want to be on life support long term (no tracheostomy). We also discussed artificial nutrition but she would like to think further about this. She is becoming very sleepy so we will discuss  this further tomorrow. She thanked me for our talk today.   Late entry: I was unable to call family today but plan to discuss my conversation with husband and daughter tomorrow.   Primary Decision Maker PATIENT    SUMMARY OF RECOMMENDATIONS   - Plan for surgery per patient and family  Code Status/Advance Care Planning:  Full code - see above discussion   Symptom Management:   Per primary and surgery  Consider PT to work with her and get out of bed  Palliative Prophylaxis:   Delirium Protocol, Frequent Pain Assessment, Oral Care and Turn Reposition  Additional Recommendations (Limitations, Scope, Preferences):  Full Scope Treatment  Psycho-social/Spiritual:   Desire for further Chaplaincy support:yes  Additional Recommendations: Caregiving  Support/Resources  Prognosis:   Overall prognosis poor with poor nutrition and declining functional status as well as poor surgical candidate given frailty and advanced age.   Discharge Planning: To Be Determined      Primary Diagnoses: Present on Admission: . Acute diverticulitis . Colouterine fistula . Constipation   I have reviewed the medical record, interviewed the patient and family, and examined the patient. The following aspects are pertinent.  Past Medical History:  Diagnosis Date  . Abdominal aneurysm ([place])   . Chronic diarrhea 07/10/2017  . COPD (chronic obstructive pulmonary disease) ([facility])    Social History   Socioeconomic History  . Marital status: Married    Spouse name: Not on file  . Number of children: Not on file  . Years of education: Not on file  . Highest education level: Not on file  Occupational History  . Not on file  Social Needs  . Financial resource strain: Not on file  . Food insecurity:    Worry: Not on file    Inability: Not on file  . Transportation needs:    Medical: Not on file    Non-medical: Not on file  Tobacco Use  . Smoking status: Former Smoker    Packs/day: 1.50     Years: 70.00    Pack years: 105.00    Types: Cigarettes  . Smokeless tobacco: Never Used  Substance and Sexual Activity  . Alcohol use: Never    Frequency: Never  . Drug use: Never  . Sexual activity: Not on file  Lifestyle  . Physical activity:    Days per week: Not on file    Minutes per session: Not on file  . Stress: Not on file  Relationships  . Social connections:    Talks on phone: Not on file    Gets together: Not on file    Attends religious service: Not on file    Active member of club or organization: Not on file    Attends meetings of clubs or organizations: Not on file    Relationship status: Not on file  Other Topics Concern  . Not on file  Social History Narrative  . Not on file   History reviewed. No pertinent family history. Scheduled Meds: . albuterol  3 mL Inhalation Daily  . enoxaparin (LOVENOX) injection  40 mg Subcutaneous Q24H  . feeding supplement (ENSURE ENLIVE)  237 mL Oral TID BM  . insulin aspart  0-9 Units Subcutaneous Q8H  . metoprolol tartrate  12.5 mg Oral BID  . polyethylene glycol  17 g Oral Daily  . pravastatin  40 mg Oral q1800   Continuous Infusions: . piperacillin-tazobactam (ZOSYN)  IV 12.5 mL/hr at 11/17/17 0600  . potassium chloride    . TPN ADULT (ION) 30 mL/hr at 11/17/17 0600  . TPN ADULT (ION)     PRN Meds:.acetaminophen **OR** acetaminophen, liver oil-zinc oxide, morphine injection, ondansetron **OR** ondansetron (ZOFRAN) IV, sodium chloride flush No Known Allergies Review of Systems  Constitutional: Positive for activity change, appetite change and fatigue.       Sleepy all the time  Neurological: Positive for weakness.    Physical Exam  Constitutional: She is oriented to person, place, and time. She appears ill.  Frail, elderly  HENT:  Head: Normocephalic and atraumatic.  Cardiovascular: Normal rate.  Pulmonary/Chest: Effort normal. No accessory muscle usage. No tachypnea. No respiratory distress.  Abdominal:  Normal appearance. There is tenderness.  Neurological: She is alert and oriented to person, place, and time.  Nursing note and vitals reviewed.   Vital Signs: BP (!) 105/56 (BP Location: Left Leg)   Pulse 75   Temp 98.6 F (37 C) (Oral)   Resp 14   Ht 5' 4"  (1.626 m)   Wt 50.4 kg   SpO2 99%   BMI 19.07 kg/m  Pain Scale: 0-10 POSS *See Group Information*: 1-Acceptable,Awake and alert Pain Score: 0-No pain   SpO2: SpO2: 99 % O2  Device:SpO2: 99 % O2 Flow Rate: .O2 Flow Rate (L/min): 2 L/min  IO: Intake/output summary:   Intake/Output Summary (Last 24 hours) at 11/17/2017 0917 Last data filed at 11/17/2017 0600 Gross per 24 hour  Intake 834.19 ml  Output -  Net 834.19 ml    LBM: Last BM Date: 11/15/17 Baseline Weight: Weight: 50.4 kg Most recent weight: Weight: 50.4 kg     Palliative Assessment/Data:   Flowsheet Rows     Most Recent Value  Intake Tab  Referral Department  Surgery  Unit at Time of Referral  Med/Surg Unit  Palliative Care Primary Diagnosis  Other (Comment) [colo-uterine fistula]  Date Notified  11/16/17  Palliative Care Type  New Palliative care  Reason for referral  Clarify Goals of Care  Date of Admission  11/13/17  Date first seen by Palliative Care  11/16/17  # of days Palliative referral response time  0 Day(s)  # of days IP prior to Palliative referral  3  Clinical Assessment  Psychosocial & Spiritual Assessment  Palliative Care Outcomes      Time In: 0845 Time Out: 0945 Time Total: 60 min Greater than 50%  of this time was spent counseling and coordinating care related to the above assessment and plan.  Signed by: Vinie Sill, NP Palliative Medicine Team Pager # [phone] (M-F 8a-5p) Team Phone # [phone] (Nights/Weekends)

## 2017-11-17 NOTE — Progress Notes (Signed)
PHARMACY - ADULT TOTAL PARENTERAL NUTRITION CONSULT NOTE   Pharmacy Consult for TPN Indication: severe malnutrition  Patient Measurements: Height: 5\' 4"  (162.6 cm) Weight: 111 lb 1.8 oz (50.4 kg) IBW/kg (Calculated) : 54.7 TPN AdjBW (KG): 50.4 Body mass index is 19.07 kg/m. Usual Weight: 50.4 kg  Insulin Requirements: No hx of DM, no insulin use   Current Nutrition: thin liquid diet   IVF: none  Central access: 9/23 line placement   TPN start date:  9/23   ASSESSMENT                                                                                                          HPI: Recurrent sigmoid diverticulitis with colo-uterine fistula in elderly patient with significant medical problems.    Significant events:   Today:    Glucose - WNL,   Electrolytes - K+ 3.2, trend down from previous day,   Magnesium 2.2, WNL, CorrCa is 10.3, elevated   Renal - WNL, SCr stable  LFTs - WNL  TGs - 73 ( 9/24)   Prealbumin - 5.2 ( 9/24)   NUTRITIONAL GOALS                                                                                             RD recs: 9/23  Kcal:  1500-1700  Protein:  60-70g  Fluid:  1.7L/day    PLAN             Potassium chloride 10 meq IV x 3                                                                                                                At 1800 today:  Increase TPN to 60 ml/hr.         TPN formula to include increased potassium to 50         meq/L,     Plan to advance as tolerated to the goal rate.  TPN to contain standard multivitamins and trace elements.  Famotidine added to TPN  Continue sensitive SSI q8h   BMP, Magnesium, phosphorus with AM labs   TPN lab panels on Mondays & Thursdays.  F/u daily.   Royetta Asal, PharmD,  BCPS Pager [phone] 11/17/2017 8:26 AM

## 2017-11-17 NOTE — Progress Notes (Signed)
CC: recurrent diverticulitis  Subjective: PICC line placed yesterday and starting TNA.  She says she does not hurt, but still somewhat tender LLQ.    Objective: Vital signs in last 24 hours: Temp:  [98.4 F (36.9 C)-99.2 F (37.3 C)] 98.6 F (37 C) (09/24 0527) Pulse Rate:  [75-96] 75 (09/24 0527) Resp:  [14-22] 14 (09/24 0527) BP: (100-172)/(47-61) 105/56 (09/24 0527) SpO2:  [95 %-99 %] 99 % (09/24 0527) Last BM Date: 11/15/17 300 PO 500 IV Urine x 5 Stool x 4 Afebrile, VSS K+ 3.2 Prealbumin 5.7 WBC 12.4 H/H stable  Intake/Output from previous day: 09/23 0701 - 09/24 0700 In: 834.2 [P.O.:300; I.V.:403.1; IV Piggyback:131.1] Out: -  Intake/Output this shift: No intake/output data recorded.  General appearance: alert, cooperative and no distress Resp: clear to auscultation bilaterally GI: soft, not distended, slightly tener LLQ to palpation.  she does not know if she had any stool from her vagina yesterday.    Lab Results:  Recent Labs    11/16/17 0450 11/17/17 0338  WBC 9.8 12.4*  HGB 9.0* 8.4*  HCT 29.1* 26.3*  PLT 624* 559*    BMET Recent Labs    11/16/17 0450 11/17/17 0338  NA 139 139  138  K 4.9 3.2*  3.2*  CL 100 101  101  CO2 32 31  31  GLUCOSE 79 99  100*  BUN 13 16  14   CREATININE 0.88 0.80  0.81  CALCIUM 8.9 8.5*  8.3*   PT/INR No results for input(s): LABPROT, INR in the last 72 hours.  Recent Labs  Lab 11/13/17 1658 11/16/17 0450 11/17/17 0338  AST 17  --  14*  ALT 9  --  7  ALKPHOS 66  --  50  BILITOT 0.5  --  0.5  PROT 6.0*  --  5.4*  ALBUMIN 2.0* 1.7* 1.7*  1.7*     Lipase     Component Value Date/Time   LIPASE 21 11/13/2017 1658     Medications: . albuterol  3 mL Inhalation Daily  . enoxaparin (LOVENOX) injection  40 mg Subcutaneous Q24H  . feeding supplement (ENSURE ENLIVE)  237 mL Oral TID BM  . insulin aspart  0-9 Units Subcutaneous Q8H  . metoprolol tartrate  12.5 mg Oral BID  . polyethylene  glycol  17 g Oral Daily  . pravastatin  40 mg Oral q1800   Anti-infectives (From admission, onward)   Start     Dose/Rate Route Frequency Ordered Stop   11/14/17 0600  piperacillin-tazobactam (ZOSYN) IVPB 3.375 g     3.375 g 12.5 mL/hr over 240 Minutes Intravenous Every 8 hours 11/13/17 2225     11/13/17 2115  piperacillin-tazobactam (ZOSYN) IVPB 3.375 g     3.375 g 12.5 mL/hr over 240 Minutes Intravenous  Once 11/13/17 2101 11/14/17 0133      Assessment/Plan COPD/ > 100 pack years/on Home O2 Abdominal aortic aneurysm Hypertension Hx SVT - metoprolol  Hx FTT - Wheelchair to bed/some walking in room with walker Severe malnutrition - prealbumin 5.2/5.7 Chronic constipation   Recurrent diverticulitis with colo-uterine fistula - draining stool vaginally  FEN: TNA/clear liquids ID:  Zosyn 9/20 =>> day 5 DVT:  Lovneox Follow up:  TBD Foley:  none    Plan:  Continue antibiotics, TNA started last PM, Palliative consult in progress.  Will continue to discuss options with patient and family.  I will let pharmacy correct the K+ thru the TNA.    LOS: 4 days  Earnstine Regal 11/17/2017 [phone]

## 2017-11-17 NOTE — Progress Notes (Signed)
PROGRESS NOTE    Debbie Yearick  KZL:[id number] DOB: [birth year] DOA: 11/13/2017 PCP: Orvis Brill, [facility]  Outpatient Specialists:   Brief Narrative:  Patient is an 84 year old female with past medical history significant for COPD, abdominal aortic aneurysm, chronic constipation, recurrent diverticulitis. Patient has been admitted with another episode of diverticulitis with concerns for colo-uterine fistula, as the patient is said to be passing stool via the vagina. Patient admitted for further management. Surgical team on board.   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 Rec palliative consult for [place] discussion prior to invasive procedure Palliative on board, appreciate recs Continue IV Zosyn Started TPN to build up nutrition per surgery  HTN Stable  Normocytic anemia likely due to malnutrition Hgb at baseline Anemia panel showed iron 24, sats 24%, folate/Vitamin B12 WNL  Hx of SVT Continue Metoprolol  Malnutrition Dietician on board TPN recommended by Gen surg  Hypokalemia Replace prn   DVT prophylaxis: Lovenox Code Status: Full Family Communication: None at bedside Disposition Plan: Once work up complete Consults called: General surgery  Procedures:   None  Antimicrobials:   IV Zosyn   Subjective: Pt denies any abdominal pain, still noted to be TTP on LLQ, N/V, fever/chills, chest pain, continues to pass stool via vagina  Objective: Vitals:   11/16/17 1331 11/16/17 2121 11/17/17 0527 11/17/17 0923  BP: (!) 120/58 (!) 172/61 (!) 105/56   Pulse: 82 87 75   Resp: (!) 22 14 14    Temp: 98.4 F (36.9 C) 99.2 F (37.3 C) 98.6 F (37 C)   TempSrc: Oral Oral Oral     SpO2: 99% 95% 99% 96%  Weight:      Height:        Intake/Output Summary (Last 24 hours) at 11/17/2017 1418 Last data filed at 11/17/2017 1000 Gross per 24 hour  Intake 764.11 ml  Output -  Net 764.11 ml   Filed Weights   11/13/17 2250  Weight: 50.4 kg    Examination:  General exam: Cachectic, chronically ill appearing, NAD Respiratory system: CTAB Cardiovascular system: S1 & S2 present Gastrointestinal system: Abdomen is soft, very mild LLQ tenderness, ND, BS present  Central nervous system: Alert and oriented. No focal neurological deficits noted Extremities: No bilateral pedal edema  Data Reviewed: I have personally reviewed following labs and imaging studies  CBC: Recent Labs  Lab 11/13/17 1658 11/14/17 0424 11/16/17 0450 11/17/17 0338  WBC 15.6* 12.3* 9.8 12.4*  NEUTROABS 13.1*  --  6.2 9.0*  HGB 10.9* 9.3* 9.0* 8.4*  HCT 34.5* 30.2* 29.1* 26.3*  MCV 88.7 89.3 89.8 88.9  PLT 627* 699* 624* 390*   Basic Metabolic Panel: Recent Labs  Lab 11/13/17 1658 11/14/17 0424 11/16/17 0450 11/17/17 0338  NA 139 139 139 139  138  K 3.3* 3.9 4.9 3.2*  3.2*  CL 100 100 100 101  101  CO2 31 32 32 31  31  GLUCOSE 104* 81 79 99  100*  BUN 13 13 13 16  14   CREATININE 0.68 0.68 0.88 0.80  0.81  CALCIUM 9.6 9.1 8.9 8.5*  8.3*  MG  --   --  2.4 2.2  PHOS  --   --  3.1 2.6  2.8   GFR: Estimated Creatinine Clearance: 38.9  mL/min (by C-G formula based on SCr of 0.81 mg/dL). Liver Function Tests: Recent Labs  Lab 11/13/17 1658 11/16/17 0450 11/17/17 0338  AST 17  --  14*  ALT 9  --  7  ALKPHOS 66  --  50  BILITOT 0.5  --  0.5  PROT 6.0*  --  5.4*  ALBUMIN 2.0* 1.7* 1.7*  1.7*   Recent Labs  Lab 11/13/17 1658  LIPASE 21   No results for input(s): AMMONIA in the last 168 hours. Coagulation Profile: No results for input(s): INR, PROTIME in the last 168 hours. Cardiac Enzymes: No results for input(s): CKTOTAL, CKMB, CKMBINDEX, TROPONINI in the last  168 hours. BNP (last 3 results) No results for input(s): PROBNP in the last 8760 hours. HbA1C: No results for input(s): HGBA1C in the last 72 hours. CBG: Recent Labs  Lab 11/17/17 0005 11/17/17 0918  GLUCAP 155* 114*   Lipid Profile: Recent Labs    11/17/17 0338  TRIG 73   Thyroid Function Tests: No results for input(s): TSH, T4TOTAL, FREET4, T3FREE, THYROIDAB in the last 72 hours. Anemia Panel: Recent Labs    11/17/17 0338  VITAMINB12 417  FOLATE 7.1  FERRITIN 157  TIBC 100*  IRON 24*   Urine analysis:    Component Value Date/Time   COLORURINE YELLOW 11/13/2017 2047   APPEARANCEUR HAZY (A) 11/13/2017 2047   LABSPEC 1.006 11/13/2017 2047   PHURINE 8.0 11/13/2017 2047   GLUCOSEU NEGATIVE 11/13/2017 2047   HGBUR NEGATIVE 11/13/2017 2047   BILIRUBINUR NEGATIVE 11/13/2017 2047   KETONESUR NEGATIVE 11/13/2017 2047   PROTEINUR NEGATIVE 11/13/2017 2047   NITRITE NEGATIVE 11/13/2017 2047   LEUKOCYTESUR NEGATIVE 11/13/2017 2047   Sepsis Labs: @LABRCNTIP (procalcitonin:4,lacticidven:4)  ) No results found for this or any previous visit (from the past 240 hour(s)).       Radiology Studies: [place] Ekg Site Rite  Result Date: 11/16/2017 If [facility] image not attached, placement could not be confirmed due to current cardiac rhythm.       Scheduled Meds: . albuterol  3 mL Inhalation Daily  . enoxaparin (LOVENOX) injection  40 mg Subcutaneous Q24H  . feeding supplement (ENSURE ENLIVE)  237 mL Oral TID BM  . insulin aspart  0-9 Units Subcutaneous Q8H  . metoprolol tartrate  12.5 mg Oral BID  . polyethylene glycol  17 g Oral Daily  . pravastatin  40 mg Oral q1800   Continuous Infusions: . piperacillin-tazobactam (ZOSYN)  IV 12.5 mL/hr at 11/17/17 0600  . TPN ADULT (ION) 30 mL/hr at 11/17/17 0600  . TPN ADULT (ION)       LOS: 4 days    Time spent: 30 minutes.    Roslyn Smiling, MD  [facility] 7PM-7AM contact night coverage as  above

## 2017-11-18 LAB — GLUCOSE, CAPILLARY
GLUCOSE-CAPILLARY: 120 mg/dL — AB (ref 70–99)
GLUCOSE-CAPILLARY: 198 mg/dL — AB (ref 70–99)
GLUCOSE-CAPILLARY: 175 mg/dL — AB (ref 70–99)
Glucose-Capillary: 153 mg/dL — ABNORMAL HIGH (ref 70–99)
Glucose-Capillary: 205 mg/dL — ABNORMAL HIGH (ref 70–99)

## 2017-11-18 LAB — RENAL FUNCTION PANEL
Albumin: 1.7 g/dL — ABNORMAL LOW (ref 3.5–5.0)
Anion gap: 3 — ABNORMAL LOW (ref 5–15)
BUN: 19 mg/dL (ref 8–23)
CO2: 30 mmol/L (ref 22–32)
Calcium: 8.4 mg/dL — ABNORMAL LOW (ref 8.9–10.3)
Chloride: 104 mmol/L (ref 98–111)
Creatinine, Ser: 0.67 mg/dL (ref 0.44–1.00)
GFR calc Af Amer: >60 mL/min (ref >60)
GFR calc non Af Amer: >60 mL/min (ref >60)
Glucose, Bld: 140 mg/dL — ABNORMAL HIGH (ref 70–99)
Phosphorus: 1.9 mg/dL — ABNORMAL LOW (ref 2.5–4.6)
Potassium: 4.1 mmol/L (ref 3.5–5.1)
Sodium: 137 mmol/L (ref 135–145)

## 2017-11-18 LAB — MAGNESIUM: MAGNESIUM: 2 mg/dL (ref 1.7–2.4)

## 2017-11-18 MED ORDER — TRAVASOL 10 % IV SOLN
INTRAVENOUS | Status: AC
  Administered 2017-11-18: 18:00:00 via INTRAVENOUS
  Filled 2017-11-18: qty 720

## 2017-11-18 MED ORDER — SODIUM PHOSPHATES 45 MMOLE/15ML IV SOLN
10.0000 mmol | Freq: Once | INTRAVENOUS | Status: AC
  Administered 2017-11-18: 10 mmol via INTRAVENOUS
  Filled 2017-11-18: qty 3.33

## 2017-11-18 MED ORDER — INSULIN ASPART 100 UNIT/ML ~~LOC~~ SOLN
0.0000 [IU] | Freq: Four times a day (QID) | SUBCUTANEOUS | Status: DC
  Administered 2017-11-18: 3 [IU] via SUBCUTANEOUS
  Administered 2017-11-18: 2 [IU] via SUBCUTANEOUS
  Administered 2017-11-19: 1 [IU] via SUBCUTANEOUS
  Administered 2017-11-19: 2 [IU] via SUBCUTANEOUS

## 2017-11-18 NOTE — Progress Notes (Signed)
[facility] PROGRESS NOTE  Melisssa Donner HCW:[id number] DOB: [birth year] DOA: 11/13/2017 PCP: Orvis Brill, [facility]   Narrative: 84 year old female AAA, COPD, chronic constipation Recent admission 9/7-9/15 with diverticulitis sigmoid-at that time was medically managed general surgery saw the patient and it was felt that CT showed a blind ending fistula --was complicated by SVT Readmitted 9/20 lower abdominal pain recurrent diverticulitis General surgery reconsulted-Long discussion with family regarding resection being last resort  A & Plan Blind-ending intra-abdominal colo-vaginal fistula-Per general surgery-Zosyn for now--family awaiting input to make decision HTN-stable SVT last admit-started metoprolol continue same Severe malnutrition-Albumin level 1.7-continue TPN via right PICC line-get LFTs and micronutrients as per protocol Glycemia-control with sliding scale while on TPN Adult failure to thrive-palliative is seen-family wishes to proceed with surgery despite high risk    DVT prophylaxis: Lovenox code Status: Full family Communication: None present disposition Plan: Inpatient   Grady Mohabir, MD  [facility] Direct contact: [phone] --Via amion app OR  --[email or web address]; password [email or web address]  7PM-7AM contact night coverage as above 11/18/2017, 9:14 AM  LOS: 5 days   Consultants:  General surgery  Procedures:  No  Antimicrobials:  Zosyn  Interval history/Subjective: Awake alert pleasant no distress Some hunger and wishes to eat a diet but understands can only be on liquids Mildly confused thinks she was transferred down from [place] but cannot tell me name was city   Objective:  Vitals:  Vitals:   11/18/17 0706 11/18/17 0902  BP: (!) 160/94   Pulse: 67   Resp: 18   Temp: 99.1 F (37.3 C)   SpO2: 96% 98%    Exam:  Mild confusion EOMI NCAT no distress Chest clear Abdomen tender right lower quadrant slightly distended Bitemporal  wasting No lower extremity edema left lower extremity has chronic redness and possible old wound per patient Neurologically intact Memory is a little bit deficient Psych euthymic  I have personally reviewed the following:   Labs:  Phosphorus 1.9 albumin 1.7 glucose 140  Imaging studies:  None no  Medical tests:  No  Test discussed with performing physician:  No  Decision to obtain old records:  Yes  Review and summation of old records:  No  Scheduled Meds: . albuterol  3 mL Inhalation Daily  . enoxaparin (LOVENOX) injection  40 mg Subcutaneous Q24H  . feeding supplement (ENSURE ENLIVE)  237 mL Oral TID BM  . insulin aspart  0-9 Units Subcutaneous Q6H  . metoprolol tartrate  12.5 mg Oral BID  . polyethylene glycol  17 g Oral Daily  . pravastatin  40 mg Oral q1800   Continuous Infusions: . piperacillin-tazobactam (ZOSYN)  IV 3.375 g (11/18/17 0604)  . sodium phosphate  Dextrose 5% IVPB    . TPN ADULT (ION) 60 mL/hr at 11/18/17 0700  . TPN ADULT (ION)      Principal Problem:   Acute diverticulitis Active Problems:   Constipation   Colouterine fistula   Protein-calorie malnutrition, severe   Goals of care, counseling/discussion   DNR (do not resuscitate) discussion   Palliative care encounter   LOS: 5 days

## 2017-11-18 NOTE — Progress Notes (Addendum)
Palliative:  Ms. Atayde is more alert today but also appears a little more confused (asked if it were Saturday). She continues to desire surgery. She struggles to make any firm decisions regarding GOC, code status, or form an opinion on artificial nutrition. She is very pleasant and I offered emotional support. She is lying in a dark room so I opened her blinds and I think she would benefit from getting out of bed as she will be risky for delirium especially post-op.   I called and spoke extensively with her daughter Arbie Cookey and also with her husband Mateo Flow. They ask many questions (some of them repeatedly). Mateo Flow in particular struggles to understand the situation (although he is 84 yo and lives in [place]). Arbie Cookey has more insight and asks questions regarding her mothers odds of recovery after surgery and I told her that I had many concerns she could do poorly especially since her health has already been so poor this does not prepare her for a good recovery. Husband is requesting to speak with surgeon (I have placed his phone number on facesheet).   Given Ms. Piazza's desire "to try" with resuscitation UNLESS we do not believe she has a chance I believe we should continue to follow closely especially post-op to help with transition to comfort if indicated.   69 min  Vinie Sill, NP Palliative Medicine Team Pager # [phone] (M-F 8a-5p) Team Phone # [phone] (Nights/Weekends)

## 2017-11-18 NOTE — Progress Notes (Signed)
    CC:  Abdominal pain recurrent diverticulitis   Subjective: Feeling better, patient and family want to go forward with surgery.  Dr. Barry Dienes will discuss.  Objective: Vital signs in last 24 hours: Temp:  [99.1 F (37.3 C)-99.9 F (37.7 C)] 99.1 F (37.3 C) (09/25 0706) Pulse Rate:  [67-98] 67 (09/25 0706) Resp:  [16-18] 18 (09/25 0706) BP: (137-160)/(62-94) 160/94 (09/25 0706) SpO2:  [96 %-100 %] 98 % (09/25 0902) Last BM Date: 11/17/17 870 Po 1130 IV Urine x 5 BM x 3 Afebrile, VSS CMP OK  Intake/Output from previous day: 09/24 0701 - 09/25 0700 In: 2246.9 [P.O.:870; I.V.:1130.4; IV Piggyback:246.6] Out: 0  Intake/Output this shift: No intake/output data recorded.  General appearance: alert, cooperative and no distress Resp: clear to auscultation bilaterally GI: She is soft and doing well with abx.  Still some tenderness LLQ on palpation.  She can't tell me if she is having stool from her vagina.    Lab Results:  Recent Labs    11/16/17 0450 11/17/17 0338  WBC 9.8 12.4*  HGB 9.0* 8.4*  HCT 29.1* 26.3*  PLT 624* 559*    BMET Recent Labs    11/17/17 0338 11/18/17 0408  NA 139  138 137  K 3.2*  3.2* 4.1  CL 101  101 104  CO2 31  31 30   GLUCOSE 99  100* 140*  BUN 16  14 19   CREATININE 0.80  0.81 0.67  CALCIUM 8.5*  8.3* 8.4*   PT/INR No results for input(s): LABPROT, INR in the last 72 hours.  Recent Labs  Lab 11/13/17 1658 11/16/17 0450 11/17/17 0338 11/18/17 0408  AST 17  --  14*  --   ALT 9  --  7  --   ALKPHOS 66  --  50  --   BILITOT 0.5  --  0.5  --   PROT 6.0*  --  5.4*  --   ALBUMIN 2.0* 1.7* 1.7*  1.7* 1.7*     Lipase     Component Value Date/Time   LIPASE 21 11/13/2017 1658     Medications: . albuterol  3 mL Inhalation Daily  . enoxaparin (LOVENOX) injection  40 mg Subcutaneous Q24H  . feeding supplement (ENSURE ENLIVE)  237 mL Oral TID BM  . insulin aspart  0-9 Units Subcutaneous Q6H  . metoprolol tartrate   12.5 mg Oral BID  . polyethylene glycol  17 g Oral Daily  . pravastatin  40 mg Oral q1800    Assessment/Plan  COPD/ > 100 pack years/on Home O2 Abdominal aortic aneurysm Hypertension HxSVT- metoprolol  Hx FTT -Wheelchair to bed/some walking in room with walker Severe malnutrition - prealbumin 5.2/5.7 Chronic constipation   Recurrent diverticulitis with colo-uterine fistula- draining stool vaginally  FEN: TNA/clear liquids ID: Zosyn 9/20 =>> day 6 DVT: Lovneox Follow up: TBD Foley: none   PLan:  Family and pt want to go forward with surgery.  Palliative has talked with them and are in agreement.  Dr. Barry Dienes will talk with them today.    LOS: 5 days    Melissa Riddle 11/18/2017 [phone]

## 2017-11-18 NOTE — Progress Notes (Signed)
PHARMACY - ADULT TOTAL PARENTERAL NUTRITION CONSULT NOTE   Pharmacy Consult for TPN Indication: severe malnutrition  Patient Measurements: Height: 5\' 4"  (162.6 cm) Weight: 111 lb 1.8 oz (50.4 kg) IBW/kg (Calculated) : 54.7 TPN AdjBW (KG): 50.4 Body mass index is 19.07 kg/m. Usual Weight: 50.4 kg  Insulin Requirements: 4 units in past 24 hours   Current Nutrition: thin liquid diet   IVF: none  Central access: 9/23 line placement   TPN start date:  9/23   ASSESSMENT                                                                                                          HPI: Recurrent sigmoid diverticulitis with colo-uterine fistula in elderly patient with significant medical problems.    Significant events:   Today:    Glucose - ( goal <150), readings WNl, one reading of 195   Electrolytes - K+ WNL and corrected at 4.1,   Magnesium 2.0, WNL, CorrCa is 10.3, WNL but borderline elevated but has remained stable, Phos is 1.9, low   Renal - WNL, SCr stable  LFTs - WNL  TGs - 73 ( 9/24)   Prealbumin - 5.2 ( 9/24)   NUTRITIONAL GOALS                                                                                             RD recs: 9/23  Kcal:  1500-1700  Protein:  60-70g  Fluid:  1.7L/day  Currrent TPN provides 72 grams of protein, 1552 Kcal and 1440 ml per day.    PLAN              Sodium phosphate 10 mmol IV x1                                                                                                                 At 1800 today:  Continue TPN to 60 ml/hr, the target rate    Plan to advance as tolerated to the goal rate.  TPN to contain standard multivitamins and trace elements.  Famotidine added to TPN  Increase CBG checks to sensitive SSI q6h   TPN lab panels on Mondays & Thursdays.  F/u daily.  Royetta Asal, PharmD, BCPS Pager [phone] 11/18/2017 8:01 AM

## 2017-11-19 ENCOUNTER — Inpatient Hospital Stay (HOSPITAL_COMMUNITY): Payer: Medicare FFS

## 2017-11-19 DIAGNOSIS — N828 Other female genital tract fistulae: Secondary | ICD-10-CM

## 2017-11-19 LAB — COMPREHENSIVE METABOLIC PANEL
ALBUMIN: 1.9 g/dL — AB (ref 3.5–5.0)
ALT: 10 U/L (ref 0–44)
AST: 21 U/L (ref 15–41)
Alkaline Phosphatase: 39 U/L (ref 38–126)
Anion gap: 5 (ref 5–15)
BUN: 23 mg/dL (ref 8–23)
CHLORIDE: 105 mmol/L (ref 98–111)
CO2: 27 mmol/L (ref 22–32)
CREATININE: 0.75 mg/dL (ref 0.44–1.00)
Calcium: 8.9 mg/dL (ref 8.9–10.3)
GFR calc Af Amer: >60 mL/min (ref >60)
GFR calc non Af Amer: >60 mL/min (ref >60)
GLUCOSE: 139 mg/dL — AB (ref 70–99)
Potassium: 4.4 mmol/L (ref 3.5–5.1)
SODIUM: 137 mmol/L (ref 135–145)
Total Bilirubin: 0.2 mg/dL — ABNORMAL LOW (ref 0.3–1.2)
Total Protein: 5.5 g/dL — ABNORMAL LOW (ref 6.5–8.1)

## 2017-11-19 LAB — GLUCOSE, CAPILLARY
GLUCOSE-CAPILLARY: 158 mg/dL — AB (ref 70–99)
GLUCOSE-CAPILLARY: 200 mg/dL — AB (ref 70–99)
Glucose-Capillary: 142 mg/dL — ABNORMAL HIGH (ref 70–99)
Glucose-Capillary: 226 mg/dL — ABNORMAL HIGH (ref 70–99)

## 2017-11-19 LAB — MAGNESIUM: Magnesium: 2.2 mg/dL (ref 1.7–2.4)

## 2017-11-19 LAB — PHOSPHORUS: Phosphorus: 2.5 mg/dL (ref 2.5–4.6)

## 2017-11-19 MED ORDER — SODIUM PHOSPHATES 45 MMOLE/15ML IV SOLN
10.0000 mmol | Freq: Once | INTRAVENOUS | Status: AC
  Administered 2017-11-19: 10 mmol via INTRAVENOUS
  Filled 2017-11-19: qty 3.33

## 2017-11-19 MED ORDER — TRAVASOL 10 % IV SOLN
INTRAVENOUS | Status: AC
  Administered 2017-11-19: 17:00:00 via INTRAVENOUS
  Filled 2017-11-19: qty 820.8

## 2017-11-19 MED ORDER — BOOST / RESOURCE BREEZE PO LIQD CUSTOM
1.0000 | Freq: Two times a day (BID) | ORAL | Status: DC
  Administered 2017-11-19 – 2017-11-24 (×5): 1 via ORAL

## 2017-11-19 MED ORDER — ENSURE PRE-SURGERY PO LIQD
296.0000 mL | Freq: Once | ORAL | Status: AC
  Administered 2017-11-19: 296 mL via ORAL
  Filled 2017-11-19: qty 296

## 2017-11-19 MED ORDER — ENSURE PRE-SURGERY PO LIQD
296.0000 mL | Freq: Once | ORAL | Status: AC
  Administered 2017-11-20: 296 mL via ORAL
  Filled 2017-11-19: qty 296

## 2017-11-19 MED ORDER — INSULIN ASPART 100 UNIT/ML ~~LOC~~ SOLN
0.0000 [IU] | Freq: Four times a day (QID) | SUBCUTANEOUS | Status: DC
  Administered 2017-11-19: 5 [IU] via SUBCUTANEOUS
  Administered 2017-11-19 – 2017-11-20 (×2): 3 [IU] via SUBCUTANEOUS
  Administered 2017-11-20: 8 [IU] via SUBCUTANEOUS
  Administered 2017-11-20: 2 [IU] via SUBCUTANEOUS
  Administered 2017-11-21: 3 [IU] via SUBCUTANEOUS
  Administered 2017-11-21: 5 [IU] via SUBCUTANEOUS
  Administered 2017-11-21: 3 [IU] via SUBCUTANEOUS
  Administered 2017-11-21 – 2017-11-22 (×3): 2 [IU] via SUBCUTANEOUS

## 2017-11-19 MED ORDER — SODIUM CHLORIDE 0.9 % IV SOLN
2.0000 g | INTRAVENOUS | Status: AC
  Administered 2017-11-20: 2 g via INTRAVENOUS
  Filled 2017-11-19 (×2): qty 2

## 2017-11-19 NOTE — Progress Notes (Signed)
Palliative care consult note:  Reason for consult: Goals of care  [name] is alert and lying in bed talking to her husband on the phone.  Her daughter is at the bedside today.   We reviewed again plan for surgery tomorrow and inherent risks involved.  [name] reports understanding that she is high risk for complications, but  she has desire "to try" with surgical intervention.  She was seen and marked for ostomy today.  Discussed family desire for PMT to continue to follow post-op.   Family denies any further questions other than time for surgery tomorrow.  Total time: 20 minutes Greater than 50%  of this time was spent counseling and coordinating care related to the above assessment and plan.  Micheline Rough, MD [facility] Team [phone]

## 2017-11-19 NOTE — Progress Notes (Signed)
[facility] PROGRESS NOTE  Melissa Riddle UOH:[id number] DOB: [birth year] DOA: 11/13/2017 PCP: Orvis Brill, [facility]   Narrative:  84 year old female AAA, COPD, chronic constipation Recent admission 9/7-9/15 with diverticulitis sigmoid-at that time was medically managed general surgery saw the patient and it was felt that CT showed a blind ending fistula --was complicated by SVT Readmitted 9/20 lower abdominal pain recurrent diverticulitis General surgery reconsulted-Long discussion with family regarding resection being last resort  A & Plan Blind-ending intra-abdominal colo-vaginal fistula-Per general surgery-Zosyn for now--surgery 9/27 am--family and surgery have discussed risks, benefits etc--Pharm D has at my request increased protein in TPN HTN-stable SVT last admit-started metoprolol continue same Severe malnutrition-Albumin level 1.7--->1.9 continue TPN via right PICC line-get LFTs and micronutrients as per protocol Glycemia-control with sliding scale while on TPN Adult failure to thrive-palliative is seen-family wishes to proceed with surgery despite high risk    DVT prophylaxis: Lovenox code Status: Full family Communication: None present disposition Plan: Inpatient   Edmon Magid, MD  [facility] Direct contact: [phone] --Via amion app OR  --[email or web address]; password [email or web address]  7PM-7AM contact night coverage as above 11/19/2017, 8:34 AM  LOS: 6 days   Consultants:  General surgery  Procedures:  No  Antimicrobials:  Zosyn  Interval history/Subjective: Awake alert pleasant no distress Some hunger and wishes to eat a diet but understands can only be on liquids Mildly confused thinks she was transferred down from [place] but cannot tell me name was city   Objective:  Vitals:  Vitals:   11/18/17 2123 11/19/17 0627  BP: 119/70 104/60  Pulse: 98 100  Resp: 20 (!) 24  Temp: 99 F (37.2 C) 98.3 F (36.8 C)  SpO2: 100% 94%    Exam:  Awake  alert EOMI NCAT no distress Chest clear Abdomen-slightly distended Bitemporal wasting No lower extremity edema left lower extremity has chronic redness and possible old wound per patient Neurologically intact Memory is a little bit deficient Psych euthymic  I have personally reviewed the following:   Labs:  Glucose 139  Albumin up to 1.9  Imaging studies:  cxr 9/26 bronchitic changes  Medical tests:  No  Test discussed with performing physician:  No  Decision to obtain old records:  Yes  Review and summation of old records:  No  Scheduled Meds: . albuterol  3 mL Inhalation Daily  . enoxaparin (LOVENOX) injection  40 mg Subcutaneous Q24H  . feeding supplement (ENSURE ENLIVE)  237 mL Oral TID BM  . insulin aspart  0-9 Units Subcutaneous Q6H  . metoprolol tartrate  12.5 mg Oral BID  . polyethylene glycol  17 g Oral Daily  . pravastatin  40 mg Oral q1800   Continuous Infusions: . piperacillin-tazobactam (ZOSYN)  IV 3.375 g (11/19/17 0640)  . TPN ADULT (ION) 60 mL/hr at 11/19/17 0600    Principal Problem:   Acute diverticulitis Active Problems:   Constipation   Colouterine fistula   Protein-calorie malnutrition, severe   Goals of care, counseling/discussion   DNR (do not resuscitate) discussion   Palliative care encounter   LOS: 6 days

## 2017-11-19 NOTE — Progress Notes (Signed)
PHARMACY - ADULT TOTAL PARENTERAL NUTRITION CONSULT NOTE   Pharmacy Consult for TPN Indication: severe malnutrition  Patient Measurements: Height: 5\' 4"  (162.6 cm) Weight: 111 lb 1.8 oz (50.4 kg) IBW/kg (Calculated) : 54.7 TPN AdjBW (KG): 50.4 Body mass index is 19.07 kg/m. Usual Weight: 50.4 kg  Insulin Requirements: 6 units in past 24 hours   Current Nutrition: thin liquid diet, drinking about 2 of the 3 ordered Ensure per day   IVF: none  Central access: 9/23 line placement   TPN start date:  9/23   ASSESSMENT                                                                                                          HPI: Recurrent sigmoid diverticulitis with colo-uterine fistula in elderly patient with significant medical problems.    Significant events:  9/26 Dr. Verlon Au has asked about increasing the amount of protein in TPN. Spoke to Hunter from [place] and is ok with increasing target protein to 80-85 grams/day   Today:    Glucose - ( goal <150), elevated, ranges from 142 to 205 in past 24 hrs   Electrolytes - K+ WNL but trending up at  4.4,  Magnesium 2.0, WNL, CorrCa is 10.6  borderline elevated but has remained stable, Phos is 2.5, borderline low   Renal - WNL, SCr stable  LFTs - WNL ( 9/24), (9/26)   TGs - 73 ( 9/24)   Prealbumin - 5.2 ( 9/24)   NUTRITIONAL GOALS                                                                                             RD recs: 9/23  Kcal:  1500-1700  Protein:  80-85 gr (updated 9/26)   Fluid:  1.7L/day  Currrent TPN starting 9/26  provides 82 grams of protein, 1592 Kcal and 1440 ml per day.     PLAN             Sodium phosphate 10 mmol IV x1                                                                                                                 At 1800 today:  Continue TPN to 60 ml/hr, the target rate . Slightly decrease K+ conc due to trends up from 50 meq/L to 40 meq/L. Slightly inc phosphorous conc.  From 20 mmol/L to 25 mmol/L. Maintain Magnesium at 5 meq/L and sodium at 50 meq/L   Increase protein conc from 50 grams/L to 57 grams/L to provide 82 grams/day   Maintain as tolerated at the goal rate.  TPN to contain standard multivitamins and trace elements.  Famotidine added to TPN  Increase CBG checks to moderate SSI q6h   BMP, magnesium, phosphorus with AM labs   TPN lab panels on Mondays & Thursdays.  F/u daily.   Royetta Asal, PharmD, BCPS Pager [phone] 11/19/2017 9:58 AM

## 2017-11-19 NOTE — Progress Notes (Signed)
    CC:  Recurrent diverticulitis  Subjective: For surgery tomorrow, she should get marked today.  She says she has no pain and doing well with clears this AM.    Objective: Vital signs in last 24 hours: Temp:  [98.3 F (36.8 C)-99 F (37.2 C)] 98.3 F (36.8 C) (09/26 0627) Pulse Rate:  [98-100] 100 (09/26 0627) Resp:  [18-24] 24 (09/26 0627) BP: (101-119)/(60-70) 104/60 (09/26 0627) SpO2:  [94 %-100 %] 94 % (09/26 0627) Last BM Date: 11/18/17 60 PO recorded 1859 IV Urine x 5 Stool x 4 Afebrile, VSS Labs are OK this AM  Intake/Output from previous day: 09/25 0701 - 09/26 0700 In: 1859.7 [P.O.:60; I.V.:1364.8; IV Piggyback:434.9] Out: 0  Intake/Output this shift: No intake/output data recorded.  General appearance: alert, cooperative, no distress and frail/ on O2 Resp: clear to auscultation bilaterally GI: soft, nontender, no pain this AM LLQ  Lab Results:  Recent Labs    11/17/17 0338  WBC 12.4*  HGB 8.4*  HCT 26.3*  PLT 559*    BMET Recent Labs    11/18/17 0408 11/19/17 0418  NA 137 137  K 4.1 4.4  CL 104 105  CO2 30 27  GLUCOSE 140* 139*  BUN 19 23  CREATININE 0.67 0.75  CALCIUM 8.4* 8.9   PT/INR No results for input(s): LABPROT, INR in the last 72 hours.  Recent Labs  Lab 11/13/17 1658 11/16/17 0450 11/17/17 0338 11/18/17 0408 11/19/17 0418  AST 17  --  14*  --  21  ALT 9  --  7  --  10  ALKPHOS 66  --  50  --  39  BILITOT 0.5  --  0.5  --  0.2*  PROT 6.0*  --  5.4*  --  5.5*  ALBUMIN 2.0* 1.7* 1.7*  1.7* 1.7* 1.9*     Lipase     Component Value Date/Time   LIPASE 21 11/13/2017 1658     Medications: . albuterol  3 mL Inhalation Daily  . enoxaparin (LOVENOX) injection  40 mg Subcutaneous Q24H  . feeding supplement (ENSURE ENLIVE)  237 mL Oral TID BM  . insulin aspart  0-9 Units Subcutaneous Q6H  . metoprolol tartrate  12.5 mg Oral BID  . polyethylene glycol  17 g Oral Daily  . pravastatin  40 mg Oral q1800     Assessment/Plan COPD/ > 100 pack years/on Home O2 Abdominal aortic aneurysm Hypertension HxSVT- metoprolol  Hx FTT/deconditioning-Wheelchair to bed/some walking in room with walker Severemalnutrition - prealbumin 5.2/5.7 Chronic constipation   Recurrent diverticulitis with colo-uterine fistula- draining stool vaginally  FEN:TNA/clear liquids ID: Zosyn9/[id number] =>> day 7 DVT: Lovneox Follow up: TBD Foley:none  Plan:  Diverting ostomy planned for tomorrow, Dr. Barry Dienes is working to get family together for discussion.  She has talked with patient and her daughter at some length.  I have added Dietician consult, and anesthesia consults, to help get her ready tomorrow.  Recheck labs in AM, recheck EKG and CXR today before surgery.        LOS: 6 days    Melissa Riddle 11/19/2017 [phone]

## 2017-11-19 NOTE — H&P (View-Only) (Signed)
    CC:  Recurrent diverticulitis  Subjective: For surgery tomorrow, she should get marked today.  She says she has no pain and doing well with clears this AM.    Objective: Vital signs in last 24 hours: Temp:  [98.3 F (36.8 C)-99 F (37.2 C)] 98.3 F (36.8 C) (09/26 0627) Pulse Rate:  [98-100] 100 (09/26 0627) Resp:  [18-24] 24 (09/26 0627) BP: (101-119)/(60-70) 104/60 (09/26 0627) SpO2:  [94 %-100 %] 94 % (09/26 0627) Last BM Date: 11/18/17 60 PO recorded 1859 IV Urine x 5 Stool x 4 Afebrile, VSS Labs are OK this AM  Intake/Output from previous day: 09/25 0701 - 09/26 0700 In: 1859.7 [P.O.:60; I.V.:1364.8; IV Piggyback:434.9] Out: 0  Intake/Output this shift: No intake/output data recorded.  General appearance: alert, cooperative, no distress and frail/ on O2 Resp: clear to auscultation bilaterally GI: soft, nontender, no pain this AM LLQ  Lab Results:  Recent Labs    11/17/17 0338  WBC 12.4*  HGB 8.4*  HCT 26.3*  PLT 559*    BMET Recent Labs    11/18/17 0408 11/19/17 0418  NA 137 137  K 4.1 4.4  CL 104 105  CO2 30 27  GLUCOSE 140* 139*  BUN 19 23  CREATININE 0.67 0.75  CALCIUM 8.4* 8.9   PT/INR No results for input(s): LABPROT, INR in the last 72 hours.  Recent Labs  Lab 11/13/17 1658 11/16/17 0450 11/17/17 0338 11/18/17 0408 11/19/17 0418  AST 17  --  14*  --  21  ALT 9  --  7  --  10  ALKPHOS 66  --  50  --  39  BILITOT 0.5  --  0.5  --  0.2*  PROT 6.0*  --  5.4*  --  5.5*  ALBUMIN 2.0* 1.7* 1.7*  1.7* 1.7* 1.9*     Lipase     Component Value Date/Time   LIPASE 21 11/13/2017 1658     Medications: . albuterol  3 mL Inhalation Daily  . enoxaparin (LOVENOX) injection  40 mg Subcutaneous Q24H  . feeding supplement (ENSURE ENLIVE)  237 mL Oral TID BM  . insulin aspart  0-9 Units Subcutaneous Q6H  . metoprolol tartrate  12.5 mg Oral BID  . polyethylene glycol  17 g Oral Daily  . pravastatin  40 mg Oral q1800     Assessment/Plan COPD/ > 100 pack years/on Home O2 Abdominal aortic aneurysm Hypertension HxSVT- metoprolol  Hx FTT/deconditioning-Wheelchair to bed/some walking in room with walker Severemalnutrition - prealbumin 5.2/5.7 Chronic constipation   Recurrent diverticulitis with colo-uterine fistula- draining stool vaginally  FEN:TNA/clear liquids ID: [id number] =>> day 7 DVT: Lovneox Follow up: TBD Foley:none  Plan:  Diverting ostomy planned for tomorrow, Dr. Barry Dienes is working to get family together for discussion.  She has talked with patient and her daughter at some length.  I have added Dietician consult, and anesthesia consults, to help get her ready tomorrow.  Recheck labs in AM, recheck EKG and CXR today before surgery.        LOS: 6 days    Joson Sapp 11/19/2017 [phone]

## 2017-11-19 NOTE — Consult Note (Signed)
[facility] Nurse wound consult note Patient evaluated and marked for surgery for 11/20/17 in [facility].  Daughter present for half of encounter.  Questions presented by both individuals answered to their expressed satisfaction. Reason for Consult: Stoma site marking [facility] Nurse requested for preoperative stoma site marking  Discussed surgical procedure and stoma creation with patient and family.  Explained role of the [facility] nurse team.  Provided the patient with educational booklet and provided samples of pouching options.  Answered patient and family questions.   Examined patient lying, sitting in order to place the marking in the patient's visual field, away from any creases or abdominal contour issues and within the rectus muscle.  Attempted to mark below the patient's belt line.   Marked for colostomy in the LLQ 5 cm to the left of the umbilicus and 4.5 cm below the umbilicus.  Patient's abdomen cleansed with CHG wipes at site marking, allowed to air dry prior to marking.Covered mark with thin film transparent dressing to preserve mark until date of surgery.   [facility] Nurse team will follow up with patient after surgery for continue ostomy care and teaching.   Val Riles, RN, MSN, CWOCN, CNS-BC, pager [phone]

## 2017-11-19 NOTE — Progress Notes (Signed)
Pt blood pressure 94/51 metoprolol held.

## 2017-11-19 NOTE — Progress Notes (Signed)
Nutrition Follow-up  DOCUMENTATION CODES:   Severe malnutrition in context of chronic illness  INTERVENTION:    Ensure Pre surgery twice today and once tomorrow prior to surgery, each supplement provides 200 kcal and 50  grams of carbohydrates  Boost Breeze po BID, each supplement provides 250 kcal and 9 grams of protein  TPN @ 60 ml/hr- protein increased to 82 grams daily and provides 1592 kcals. Meets 100% of needs.   NUTRITION DIAGNOSIS:   Severe Malnutrition related to chronic illness(recurrent diverticulitis now with a colo-uterine fistula) as evidenced by severe fat depletion, severe muscle depletion, energy intake < 75% for > 7 days.  Ongoing   GOAL:   Patient will meet greater than or equal to 90% of their needs  Met with TPN  MONITOR:   Diet advancement, Labs, Weight trends, I & O's, Supplement acceptance, PO intake(TPN)  REASON FOR ASSESSMENT:   Consult New TPN/TNA  ASSESSMENT:    84 year old female with past medical history significant for COPD, abdominal aortic aneurysm, chronic constipation and diverticulitis that is recurrent.  Patient has been admitted with another episode of diverticulitis.    9/23- TPN started   Diverting ostomy planned for tomorrow. Pt remains on clears today. Tolerating broth and jello without complication. TPN running without complication. RD consulted to add clear liquid supplement. Spoke with surgery who would like to provide pt with ERAS protocol supplement Ensure Pre Surgery today. RD to order. Will continue to monitor diet advancement post surgery and add supplementation as necessary.   A recent wt has not been obtained since last RD visit on 9/23.   Medications reviewed and include: miralax  Labs reviewed: CBG 114-226   Diet Order:   Diet Order            Diet NPO time specified  Diet effective midnight        Diet clear liquid Room service appropriate? Yes; Fluid consistency: Thin  Diet effective now               EDUCATION NEEDS:   Education needs have been addressed  Skin:  Skin Assessment: Reviewed RN Assessment  Last BM:  11/18/17  Height:   Ht Readings from Last 1 Encounters:  11/13/17 _0  (1.626 m)    Weight:   Wt Readings from Last 1 Encounters:  11/13/17 50.4 kg    Ideal Body Weight:  54.5 kg  BMI:  Body mass index is 19.07 kg/m.  Estimated Nutritional Needs:   Kcal:  1500-1700 kcal  Protein:  75-90 grams  Fluid:  >/= 1.7 L/day  Mariana Single RD, LDN Clinical Nutrition Pager # - [phone]

## 2017-11-20 ENCOUNTER — Inpatient Hospital Stay (HOSPITAL_COMMUNITY): Payer: Medicare FFS | Admitting: Anesthesiology

## 2017-11-20 ENCOUNTER — Encounter (HOSPITAL_COMMUNITY)
Admission: EM | Disposition: A | Discharge status: Home Health | Payer: Self-pay | Source: Home / Self Care | Attending: Internal Medicine

## 2017-11-20 ENCOUNTER — Encounter (HOSPITAL_COMMUNITY): Payer: Self-pay

## 2017-11-20 HISTORY — PX: LAPAROSCOPIC DIVERTED COLOSTOMY: SHX5892

## 2017-11-20 LAB — GLUCOSE, CAPILLARY
GLUCOSE-CAPILLARY: 139 mg/dL — AB (ref 70–99)
Glucose-Capillary: 202 mg/dL — ABNORMAL HIGH (ref 70–99)
Glucose-Capillary: 257 mg/dL — ABNORMAL HIGH (ref 70–99)

## 2017-11-20 LAB — BASIC METABOLIC PANEL
Anion gap: 5 (ref 5–15)
BUN: 22 mg/dL (ref 8–23)
CHLORIDE: 105 mmol/L (ref 98–111)
CO2: 27 mmol/L (ref 22–32)
CREATININE: 0.68 mg/dL (ref 0.44–1.00)
Calcium: 8.7 mg/dL — ABNORMAL LOW (ref 8.9–10.3)
GFR calc Af Amer: >60 mL/min (ref >60)
GFR calc non Af Amer: >60 mL/min (ref >60)
GLUCOSE: 112 mg/dL — AB (ref 70–99)
Potassium: 4.2 mmol/L (ref 3.5–5.1)
Sodium: 137 mmol/L (ref 135–145)

## 2017-11-20 LAB — ABO/RH: ABO/RH(D): B NEG

## 2017-11-20 LAB — CBC
HEMATOCRIT: 23.8 % — AB (ref 36.0–46.0)
HEMOGLOBIN: 7.4 g/dL — AB (ref 12.0–15.0)
MCH: 28.1 pg (ref 26.0–34.0)
MCHC: 31.1 g/dL (ref 30.0–36.0)
MCV: 90.5 fL (ref 78.0–100.0)
PLATELETS: 435 10*3/uL — AB (ref 150–400)
RBC: 2.63 MIL/uL — ABNORMAL LOW (ref 3.87–5.11)
RDW: 17.2 % — AB (ref 11.5–15.5)
WBC: 10.6 10*3/uL — ABNORMAL HIGH (ref 4.0–10.5)

## 2017-11-20 LAB — MAGNESIUM: Magnesium: 2.3 mg/dL (ref 1.7–2.4)

## 2017-11-20 LAB — PHOSPHORUS: Phosphorus: 3.4 mg/dL (ref 2.5–4.6)

## 2017-11-20 LAB — PREPARE RBC (CROSSMATCH)

## 2017-11-20 LAB — SURGICAL PCR SCREEN
MRSA, PCR: POSITIVE — AB
Staphylococcus aureus: POSITIVE — AB

## 2017-11-20 LAB — PROTIME-INR
INR: 0.96
Prothrombin Time: 12.7 seconds (ref 11.4–15.2)

## 2017-11-20 SURGERY — CREATION, COLOSTOMY, DIVERTING, LAPAROSCOPIC
Anesthesia: General

## 2017-11-20 MED ORDER — LACTATED RINGERS IV SOLN
INTRAVENOUS | Status: DC
  Administered 2017-11-20: 1000 mL via INTRAVENOUS

## 2017-11-20 MED ORDER — LIP MEDEX EX OINT
TOPICAL_OINTMENT | CUTANEOUS | Status: AC
  Administered 2017-11-20: 16:00:00
  Filled 2017-11-20: qty 7

## 2017-11-20 MED ORDER — ROCURONIUM BROMIDE 10 MG/ML (PF) SYRINGE
PREFILLED_SYRINGE | INTRAVENOUS | Status: DC | PRN
  Administered 2017-11-20 (×2): 10 mg via INTRAVENOUS
  Administered 2017-11-20: 40 mg via INTRAVENOUS

## 2017-11-20 MED ORDER — PHENYLEPHRINE HCL 10 MG/ML IJ SOLN
INTRAMUSCULAR | Status: AC
  Filled 2017-11-20: qty 1

## 2017-11-20 MED ORDER — ONDANSETRON HCL 4 MG/2ML IJ SOLN: 4.0000 mg | Freq: Once | INTRAMUSCULAR | Status: DC | PRN

## 2017-11-20 MED ORDER — HEPARIN SODIUM (PORCINE) 5000 UNIT/ML IJ SOLN
5000.0000 [IU] | Freq: Once | INTRAMUSCULAR | Status: AC
  Administered 2017-11-20: 5000 [IU] via SUBCUTANEOUS
  Filled 2017-11-20: qty 1

## 2017-11-20 MED ORDER — SODIUM CHLORIDE 0.9 % IV SOLN
INTRAVENOUS | Status: DC | PRN
  Administered 2017-11-20 – 2017-11-24 (×2): 500 mL via INTRAVENOUS

## 2017-11-20 MED ORDER — LACTATED RINGERS IR SOLN
Status: DC | PRN
  Administered 2017-11-20: 1000 mL

## 2017-11-20 MED ORDER — CHLORHEXIDINE GLUCONATE CLOTH 2 % EX PADS
6.0000 | MEDICATED_PAD | Freq: Once | CUTANEOUS | Status: AC
  Administered 2017-11-20: 6 via TOPICAL

## 2017-11-20 MED ORDER — BUPIVACAINE-EPINEPHRINE 0.25% -1:200000 IJ SOLN
INTRAMUSCULAR | Status: DC | PRN
  Administered 2017-11-20: 20 mL

## 2017-11-20 MED ORDER — LIDOCAINE 2% (20 MG/ML) 5 ML SYRINGE
INTRAMUSCULAR | Status: AC
  Filled 2017-11-20: qty 5

## 2017-11-20 MED ORDER — TRAVASOL 10 % IV SOLN
INTRAVENOUS | Status: AC
  Administered 2017-11-20: 17:00:00 via INTRAVENOUS
  Filled 2017-11-20: qty 820.8

## 2017-11-20 MED ORDER — CHLORHEXIDINE GLUCONATE CLOTH 2 % EX PADS
6.0000 | MEDICATED_PAD | Freq: Every day | CUTANEOUS | Status: AC
  Administered 2017-11-21 – 2017-11-25 (×4): 6 via TOPICAL

## 2017-11-20 MED ORDER — SUGAMMADEX SODIUM 200 MG/2ML IV SOLN
INTRAVENOUS | Status: DC | PRN
  Administered 2017-11-20: 200 mg via INTRAVENOUS

## 2017-11-20 MED ORDER — ALVIMOPAN 12 MG PO CAPS
12.0000 mg | ORAL_CAPSULE | ORAL | Status: AC
  Administered 2017-11-20: 12 mg via ORAL
  Filled 2017-11-20: qty 1

## 2017-11-20 MED ORDER — BUPIVACAINE-EPINEPHRINE (PF) 0.25% -1:200000 IJ SOLN
INTRAMUSCULAR | Status: AC
  Filled 2017-11-20: qty 30

## 2017-11-20 MED ORDER — ETOMIDATE 2 MG/ML IV SOLN
INTRAVENOUS | Status: DC | PRN
  Administered 2017-11-20: 12 mg via INTRAVENOUS

## 2017-11-20 MED ORDER — PHENYLEPHRINE 40 MCG/ML (10ML) SYRINGE FOR IV PUSH (FOR BLOOD PRESSURE SUPPORT)
PREFILLED_SYRINGE | INTRAVENOUS | Status: AC
  Filled 2017-11-20: qty 10

## 2017-11-20 MED ORDER — DEXAMETHASONE SODIUM PHOSPHATE 10 MG/ML IJ SOLN
INTRAMUSCULAR | Status: AC
  Filled 2017-11-20: qty 1

## 2017-11-20 MED ORDER — MUPIROCIN 2 % EX OINT
1.0000 "application " | TOPICAL_OINTMENT | Freq: Two times a day (BID) | CUTANEOUS | Status: AC
  Administered 2017-11-20 – 2017-11-25 (×10): 1 via NASAL
  Filled 2017-11-20 (×3): qty 22

## 2017-11-20 MED ORDER — MEPERIDINE HCL 50 MG/ML IJ SOLN: 6.2500 mg | INTRAMUSCULAR | Status: DC | PRN

## 2017-11-20 MED ORDER — BUPIVACAINE LIPOSOME 1.3 % IJ SUSP
20.0000 mL | Freq: Once | INTRAMUSCULAR | Status: AC
  Administered 2017-11-20: 20 mL
  Filled 2017-11-20: qty 20

## 2017-11-20 MED ORDER — LIDOCAINE 2% (20 MG/ML) 5 ML SYRINGE
INTRAMUSCULAR | Status: DC | PRN
  Administered 2017-11-20: 80 mg via INTRAVENOUS

## 2017-11-20 MED ORDER — ACETAMINOPHEN 500 MG PO TABS
1000.0000 mg | ORAL_TABLET | ORAL | Status: AC
  Administered 2017-11-20: 1000 mg via ORAL
  Filled 2017-11-20: qty 2

## 2017-11-20 MED ORDER — ONDANSETRON HCL 4 MG/2ML IJ SOLN
INTRAMUSCULAR | Status: AC
  Filled 2017-11-20: qty 2

## 2017-11-20 MED ORDER — HEPARIN SODIUM (PORCINE) 5000 UNIT/ML IJ SOLN: 5000.0000 [IU] | Freq: Once | INTRAMUSCULAR | Status: DC

## 2017-11-20 MED ORDER — FENTANYL CITRATE (PF) 100 MCG/2ML IJ SOLN
INTRAMUSCULAR | Status: DC | PRN
  Administered 2017-11-20 (×4): 25 ug via INTRAVENOUS

## 2017-11-20 MED ORDER — LIDOCAINE HCL (PF) 1 % IJ SOLN
INTRAMUSCULAR | Status: AC
  Filled 2017-11-20: qty 30

## 2017-11-20 MED ORDER — SUGAMMADEX SODIUM 200 MG/2ML IV SOLN
INTRAVENOUS | Status: AC
  Filled 2017-11-20: qty 2

## 2017-11-20 MED ORDER — PROPOFOL 10 MG/ML IV BOLUS
INTRAVENOUS | Status: AC
  Filled 2017-11-20: qty 20

## 2017-11-20 MED ORDER — FENTANYL CITRATE (PF) 100 MCG/2ML IJ SOLN
INTRAMUSCULAR | Status: AC
  Filled 2017-11-20: qty 2

## 2017-11-20 MED ORDER — 0.9 % SODIUM CHLORIDE (POUR BTL) OPTIME
TOPICAL | Status: DC | PRN
  Administered 2017-11-20: 1000 mL

## 2017-11-20 MED ORDER — SODIUM CHLORIDE 0.9 % IV SOLN: 2.0000 g | INTRAVENOUS | Status: DC

## 2017-11-20 MED ORDER — FENTANYL CITRATE (PF) 100 MCG/2ML IJ SOLN: 25.0000 ug | INTRAMUSCULAR | Status: DC | PRN

## 2017-11-20 MED ORDER — LIDOCAINE HCL 2 % IJ SOLN
INTRAMUSCULAR | Status: AC
  Filled 2017-11-20: qty 20

## 2017-11-20 MED ORDER — GABAPENTIN 300 MG PO CAPS
300.0000 mg | ORAL_CAPSULE | ORAL | Status: AC
  Administered 2017-11-20: 300 mg via ORAL
  Filled 2017-11-20: qty 1

## 2017-11-20 MED ORDER — PHENYLEPHRINE HCL 10 MG/ML IJ SOLN
INTRAMUSCULAR | Status: DC | PRN
  Administered 2017-11-20: 80 ug via INTRAVENOUS

## 2017-11-20 MED ORDER — SODIUM CHLORIDE 0.9 % IV SOLN
10.0000 mL/h | Freq: Once | INTRAVENOUS | Status: AC
  Administered 2017-11-20: 11:00:00 via INTRAVENOUS

## 2017-11-20 MED ORDER — ONDANSETRON HCL 4 MG/2ML IJ SOLN
INTRAMUSCULAR | Status: DC | PRN
  Administered 2017-11-20: 4 mg via INTRAVENOUS

## 2017-11-20 MED ORDER — DEXAMETHASONE SODIUM PHOSPHATE 10 MG/ML IJ SOLN
INTRAMUSCULAR | Status: DC | PRN
  Administered 2017-11-20: 10 mg via INTRAVENOUS

## 2017-11-20 SURGICAL SUPPLY — 47 items
BENZOIN TINCTURE PRP APPL 2/3 (GAUZE/BANDAGES/DRESSINGS) IMPLANT
CLOSURE WOUND 1/2 X4 (GAUZE/BANDAGES/DRESSINGS)
COVER SURGICAL LIGHT HANDLE (MISCELLANEOUS) ×3 IMPLANT
CUTTER ECHEON FLEX ENDO 45 340 (ENDOMECHANICALS) ×2 IMPLANT
DECANTER SPIKE VIAL GLASS SM (MISCELLANEOUS) ×2 IMPLANT
DERMABOND ADVANCED (GAUZE/BANDAGES/DRESSINGS) ×2
DERMABOND ADVANCED .7 DNX12 (GAUZE/BANDAGES/DRESSINGS) IMPLANT
DEVICE SUTURE ENDOST 10MM (ENDOMECHANICALS) ×2 IMPLANT
DEVICE TROCAR PUNCTURE CLOSURE (ENDOMECHANICALS) ×2 IMPLANT
ELECT REM PT RETURN 15FT ADLT (MISCELLANEOUS) ×3 IMPLANT
ENDOSTITCH 0 SINGLE 48 (SUTURE) IMPLANT
GLOVE BIO SURGEON STRL SZ 6 (GLOVE) ×3 IMPLANT
GLOVE BIOGEL PI IND STRL 6.5 (GLOVE) IMPLANT
GLOVE BIOGEL PI IND STRL 7.0 (GLOVE) IMPLANT
GLOVE BIOGEL PI IND STRL 7.5 (GLOVE) IMPLANT
GLOVE BIOGEL PI INDICATOR 6.5 (GLOVE) ×2
GLOVE BIOGEL PI INDICATOR 7.0 (GLOVE) ×4
GLOVE BIOGEL PI INDICATOR 7.5 (GLOVE) ×2
GLOVE INDICATOR 6.5 STRL GRN (GLOVE) ×6 IMPLANT
GLOVE SURG SS PI 7.0 STRL IVOR (GLOVE) ×2 IMPLANT
GLOVE SURG SS PI 7.5 STRL IVOR (GLOVE) ×2 IMPLANT
GOWN STRL REUS W/ TWL XL LVL3 (GOWN DISPOSABLE) ×3 IMPLANT
GOWN STRL REUS W/TWL 2XL LVL3 (GOWN DISPOSABLE) ×3 IMPLANT
GOWN STRL REUS W/TWL XL LVL3 (GOWN DISPOSABLE) ×6
IRRIG SUCT STRYKERFLOW 2 WTIP (MISCELLANEOUS) ×3
IRRIGATION SUCT STRKRFLW 2 WTP (MISCELLANEOUS) IMPLANT
KIT BASIN OR (CUSTOM PROCEDURE TRAY) ×3 IMPLANT
RELOAD BLUE CHELON 45 (STAPLE) ×4 IMPLANT
RELOAD ENDO STITCH 2.0 (ENDOMECHANICALS) ×8
RELOAD SUT SNGL STCH ABSRB 2-0 (ENDOMECHANICALS) IMPLANT
SEALER TISSUE G2 STRG ARTC 35C (ENDOMECHANICALS) ×2 IMPLANT
SHEARS HARMONIC ACE PLUS 36CM (ENDOMECHANICALS) IMPLANT
SOLUTION ANTI FOG 6CC (MISCELLANEOUS) ×3 IMPLANT
STRIP CLOSURE SKIN 1/2X4 (GAUZE/BANDAGES/DRESSINGS) IMPLANT
SUT RELOAD ENDO STITCH 2 48X1 (ENDOMECHANICALS) ×4
SUT VIC AB 2-0 SH 27 (SUTURE) ×2
SUT VIC AB 2-0 SH 27X BRD (SUTURE) IMPLANT
SUT VIC AB 4-0 PS2 27 (SUTURE) IMPLANT
SUTURE RELOAD END STTCH 2 48X1 (ENDOMECHANICALS) ×4 IMPLANT
TOWEL OR 17X26 10 PK STRL BLUE (TOWEL DISPOSABLE) ×7 IMPLANT
TRAY FOLEY W/BAG SLVR 14FR (SET/KITS/TRAYS/PACK) ×2 IMPLANT
TRAY LAPAROSCOPIC (CUSTOM PROCEDURE TRAY) ×3 IMPLANT
TROCAR BLADELESS OPT 5 100 (ENDOMECHANICALS) ×2 IMPLANT
TROCAR XCEL BLUNT TIP 100MML (ENDOMECHANICALS) ×5 IMPLANT
TROCAR XCEL NON-BLD 11X100MML (ENDOMECHANICALS) ×2 IMPLANT
TROCAR XCEL UNIV SLVE 11M 100M (ENDOMECHANICALS) IMPLANT
TUBING INSUF HEATED (TUBING) ×3 IMPLANT

## 2017-11-20 NOTE — Transfer of Care (Signed)
Immediate Anesthesia Transfer of Care Note  Patient: Melissa Riddle  Procedure(s) Performed: LAPAROSCOPIC DIVERTED COLOSTOMY (N/A )  Patient Location: PACU  Anesthesia Type:General  Level of Consciousness: awake and patient cooperative  Airway & Oxygen Therapy: Patient Spontanous Breathing and Patient connected to face mask oxygen  Post-op Assessment: Report given to RN and Post -op Vital signs reviewed and stable  Post vital signs: Reviewed and stable  Last Vitals:  Vitals Value Taken Time  BP 173/95 11/20/2017  1:36 PM  Temp    Pulse 87 11/20/2017  1:41 PM  Resp 23 11/20/2017  1:41 PM  SpO2 100 % 11/20/2017  1:41 PM  Vitals shown include unvalidated device data.  Last Pain:  Vitals:   11/20/17 0913  TempSrc:   PainSc: 0-No pain      Patients Stated Pain Goal: 3 (29/19/16 6060)  Complications: No apparent anesthesia complications

## 2017-11-20 NOTE — Interval H&P Note (Signed)
History and Physical Interval Note:  11/20/2017 10:24 AM  Melissa Riddle  has presented today for surgery, with the diagnosis of RECURRENT DIVERTICULITIS WITH COLOVAGINAL FISTULA  The various methods of treatment have been discussed with the patient and family. After consideration of risks, benefits and other options for treatment, the patient has consented to  Procedure(s): LAPAROSCOPIC DIVERTED COLOSTOMY (N/A) as a surgical intervention .  The patient's history has been reviewed, patient examined, no change in status, stable for surgery.  I have reviewed the patient's chart and labs.  Questions were answered to the patient's satisfaction.     Stark Klein

## 2017-11-20 NOTE — Anesthesia Postprocedure Evaluation (Signed)
Anesthesia Post Note  Patient: Melissa Riddle  Procedure(s) Performed: LAPAROSCOPIC DIVERTED COLOSTOMY (N/A )     Patient location during evaluation: PACU Anesthesia Type: General Level of consciousness: awake and alert Pain management: pain level controlled Vital Signs Assessment: post-procedure vital signs reviewed and stable Respiratory status: spontaneous breathing, nonlabored ventilation, respiratory function stable and patient connected to nasal cannula oxygen Cardiovascular status: blood pressure returned to baseline and stable Postop Assessment: no apparent nausea or vomiting Anesthetic complications: no    Last Vitals:  Vitals:   11/20/17 1519 11/20/17 1600  BP: 140/70   Pulse:  90  Resp:  20  Temp: 37 C (!) 36.1 C  SpO2:  97%    Last Pain:  Vitals:   11/20/17 1600  TempSrc: Axillary  PainSc:                  Effie Berkshire

## 2017-11-20 NOTE — Anesthesia Preprocedure Evaluation (Addendum)
Anesthesia Evaluation  Patient identified by MRN, date of birth, ID band Patient awake    Reviewed: Allergy & Precautions, NPO status , Patient's Chart, lab work & pertinent test results  Airway Mallampati: II  TM Distance: >3 FB Neck ROM: Full    Dental  (+) Dental Advisory Given   Pulmonary COPD, former smoker,    Pulmonary exam normal breath sounds clear to auscultation       Cardiovascular negative cardio ROS Normal cardiovascular exam Rhythm:Regular Rate:Normal     Neuro/Psych negative neurological ROS  negative psych ROS   GI/Hepatic negative GI ROS, Neg liver ROS,   Endo/Other  negative endocrine ROS  Renal/GU negative Renal ROS     Musculoskeletal negative musculoskeletal ROS (+)   Abdominal   Peds  Hematology negative hematology ROS (+)   Anesthesia Other Findings   Reproductive/Obstetrics negative OB ROS                             Anesthesia Physical Anesthesia Plan  ASA: III  Anesthesia Plan: General   Post-op Pain Management:    Induction: Intravenous  PONV Risk Score and Plan: 4 or greater and Ondansetron, Dexamethasone and Treatment may vary due to age or medical condition  Airway Management Planned: Oral ETT  Additional Equipment: None  Intra-op Plan:   Post-operative Plan: Extubation in OR  Informed Consent: I have reviewed the patients History and Physical, chart, labs and discussed the procedure including the risks, benefits and alternatives for the proposed anesthesia with the patient or authorized representative who has indicated his/her understanding and acceptance.   Dental advisory given  Plan Discussed with: CRNA  Anesthesia Plan Comments:         Anesthesia Quick Evaluation

## 2017-11-20 NOTE — Progress Notes (Signed)
Given in report from PACU RN that patient had developed a skin tear to her right upper leg, and bruising on her face and left arm during her surgery. All sites were assessed upon assessment.

## 2017-11-20 NOTE — Progress Notes (Addendum)
Dr. Lissa Hoard called by Carlyon Shadow , RN to notify him of hgb 7.5 and order for heparin on call to OR.  Dr. Lissa Hoard verified that heparin was okay to give prior to surgery. Sarah, RN on 5W notified that she can administer this medication prior to coming down to surgery.

## 2017-11-20 NOTE — Anesthesia Procedure Notes (Addendum)
Procedure Name: Intubation Date/Time: 11/20/2017 11:06 AM Performed by: Dione Booze, CRNA Pre-anesthesia Checklist: Suction available, Patient being monitored, Emergency Drugs available and Patient identified Patient Re-evaluated:Patient Re-evaluated prior to induction Oxygen Delivery Method: Circle system utilized Preoxygenation: Pre-oxygenation with 100% oxygen Induction Type: IV induction Ventilation: Mask ventilation without difficulty Laryngoscope Size: Glidescope and 4 Grade View: Grade III Tube type: Subglottic suction tube Tube size: 7.0 mm Number of attempts: 3 Airway Equipment and Method: Stylet and Video-laryngoscopy Placement Confirmation: ETT inserted through vocal cords under direct vision,  positive ETCO2 and breath sounds checked- equal and bilateral Secured at: 22 cm Tube secured with: Tape Dental Injury: Teeth and Oropharynx as per pre-operative assessment  Difficulty Due To: Difficulty was anticipated, Difficult Airway- due to anterior larynx and Difficult Airway- due to limited oral opening Comments: Attempted with MAC 4, cords seen, unable to make curve, friable tissue oral pharynx, switched to glidescope. Intubated by Lissa Hoard

## 2017-11-20 NOTE — Progress Notes (Signed)
[facility] PROGRESS NOTE  Melissa Riddle MHD:[id number] DOB: 1928/11/20 DOA: 11/13/2017 PCP: Orvis Brill, [facility]   Narrative:  84 year old female AAA, COPD, chronic constipation Recent admission 9/7-9/15 with diverticulitis sigmoid-at that time was medically managed general surgery saw the patient and it was felt that CT showed a blind ending fistula --was complicated by SVT Readmitted 9/20 lower abdominal pain recurrent diverticulitis General surgery reconsulted-Long discussion with family regarding resection being last resort  A & Plan Blind-ending intra-abdominal colo-vaginal fistula-Per general surgery-Zosyn for now--s/p surgery 9/27 am--stable--routine post-op Gen surgery orders per them HTN-stable SVT last admit-started metoprolol-stable at this time Severe malnutrition-Albumin level 1.7--->1.9 continue TPN via right PICC line-get LFTs and micronutrients as per protocol--contnue as per surgery Glycemia-control with sliding scale while on TPN Adult failure to thrive-palliative has folowed Bruising--2/2 to frail and thin skin--wound care as prn Anemia-nutritional?Rpt labs am-rec'd 2 U prbc peri-op 9/27    DVT prophylaxis: Lovenox code Status: Full family Communication: d/w husband ad daughter bedsidet disposition Plan: Inpatient--anticipate SNF level care-Therapy evals requested   Amarise Lillo, MD  [facility] Direct contact: [phone] --Via amion app OR  --[email or web address]; password [email or web address]  7PM-7AM contact night coverage as above 11/20/2017, 4:30 PM  LOS: 7 days   Consultants:  General surgery  Procedures:  No  Antimicrobials:  Zosyn  Interval history/Subjective: A Seen on floor post-op No pain-coherent-"i'm ok"   Objective:  Vitals:  Vitals:   11/20/17 1519 11/20/17 1600  BP: 140/70   Pulse:  90  Resp:  20  Temp: 98.6 F (37 C) (!) 97 F (36.1 C)  SpO2:  97%    Exam:  Awake alert pleasant Bruising to R face Chest  clear Abdomen-ostomy noted now--no stool Bitemporal wasting LUE brusing Neurologically intact Memory is a little bit deficient Psych euthymic  I have personally reviewed the following:   Labs:  Hemoglobin 7.4, wbc 10.6  Imaging studies: no Medical tests:  No  Test discussed with performing physician:  No  Decision to obtain old records:  Yes  Review and summation of old records:  No  Scheduled Meds: . albuterol  3 mL Inhalation Daily  . [START ON 11/21/2017] Chlorhexidine Gluconate Cloth  6 each Topical Q0600  . enoxaparin (LOVENOX) injection  40 mg Subcutaneous Q24H  . feeding supplement  1 Container Oral BID BM  . insulin aspart  0-15 Units Subcutaneous Q6H  . metoprolol tartrate  12.5 mg Oral BID  . mupirocin ointment  1 application Nasal BID  . polyethylene glycol  17 g Oral Daily  . pravastatin  40 mg Oral q1800   Continuous Infusions: . piperacillin-tazobactam (ZOSYN)  IV 3.375 g (11/20/17 0527)  . TPN ADULT (ION) 60 mL/hr at 11/19/17 1701  . TPN ADULT (ION)      Principal Problem:   Acute diverticulitis Active Problems:   Constipation   Colouterine fistula   Protein-calorie malnutrition, severe   Goals of care, counseling/discussion   DNR (do not resuscitate) discussion   Palliative care encounter   LOS: 7 days

## 2017-11-20 NOTE — Progress Notes (Signed)
Spoke with short stay this AM and was told to administer ERAS protocol medications. HGB 7.4 this AM. Anesthesiology was consulted about Heparin administration and approved the pre-procedure dose for surgery. Given to the patient, will continue to monitor.

## 2017-11-20 NOTE — Op Note (Signed)
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  FINDINGS:  Distal sigmoid adherent to uterus and small hole in small bowel.    EBL: min  PROCEDURE:  Patient was identified in the holding area and taken to the operating room where she was placed supine on operating table.  General endotracheal anesthesia was induced.  Her arms were tucked.  A Foley catheter was placed.  Her abdomen was then prepped and draped in sterile fashion.  A timeout was performed according to the surgical safety checklist.  When all was correct, we continued.    Patient was placed into reverse Trendelenburg position and rotated to the left.  A 5 mm trocar was placed under direct visualization.  A additional two 5 mm trocars were then placed in the right lateral abdomen after pneumoperitoneum was achieved to pressure 15 mmHg.  The bowel was run and it was apparent that there was a defect in the small bowel after the bowel had been followed from the pelvis.  This was repaired with interrupted 2-0 Vicryl.  The white line of Toldt was taken down on the left and the proximal sigmoid as well as the distal descending colon was mobilized.  An appropriate site for an ostomy was located on the bowel where there was no evidence of inflammation.  The mesocolon was opened with the scissors and traversed with a blunt clamp.  The bowel was then divided with the Endo GIA stapler.  The mesocolon was then taken down straight through with the Enseal taking  care not to devascularize the end of the colon on either side.  Once the colon had been adequately mobilized, the small bowel was reexamined and there was no evidence of leakage.  The abdomen was irrigated.  The right lower quadrant trocar had had to be upsized to a 12 mm.  This was removed and closed with the Endo Close and a 0 Vicryl.  There is no evidence of air leakage.  A trocar was placed at the previously marked ostomy site in order to assist with the retraction of the bowel.  Exparel was administered in this location to assist with pain control.  This was opened further in order to create the ostomy site.  The subcutaneous tissues were divided with the cautery.  The fascia was opened in cruciate fashion with the cautery.  The rectus abdominus fibers were spread with a Kelly clamp rather than divided.  The posterior fascia was able to be stretched to 2 fingerbreadths.  The colon was then advanced to the abdominal wall.  The skin of the right-sided port sites was then closed using 4-0 Monocryl in particular fashion.  The ostomy was matured in [place] fashion with interrupted 3-0 Vicryls.  The port sites were cleaned, dried, and dressed with Dermabond.  A 1 piece ostomy appliance was applied on the left.  The patient tolerated procedure well.  She was allowed to emerge from anesthesia and was taken to the PACU in stable condition.  Needle, sponge, and instrument counts were correct x2.

## 2017-11-20 NOTE — Progress Notes (Signed)
PHARMACY - ADULT TOTAL PARENTERAL NUTRITION CONSULT NOTE   Pharmacy Consult for TPN Indication: severe malnutrition  Patient Measurements: Height: 5\' 4"  (162.6 cm) Weight: 124 lb (56.2 kg) IBW/kg (Calculated) : 54.7 TPN AdjBW (KG): 56.2 Body mass index is 21.28 kg/m. Usual Weight: 50.4 kg  Insulin Requirements: 4 units in past 24 hours   Current Nutrition: thin liquid diet, drinking about 2 of the 3 ordered Ensure per day   IVF: none  Central access: 9/23 line placement   TPN start date:  9/23   ASSESSMENT                                                                                                          HPI: Recurrent sigmoid diverticulitis with colo-uterine fistula in elderly patient with significant medical problems.    Significant events:  9/26 Dr. Verlon Au has asked about increasing the amount of protein in TPN. Spoke to [place] from [facility] and is ok with increasing target protein to 80-85 grams/day   Today:    Glucose - ( goal <150), elevated, ranges from 139 to 200 in past 24 hrs,   Electrolytes - K+ WNL , stable at  4.2,  Magnesium 2.3, WNL, CorrCa is 10.3  borderline elevated but has remained stable, Phos is 3.4, WNL after replenishment,    Renal - WNL, SCr stable  LFTs - WNL ( 9/24), (9/26)   TGs - 73 ( 9/24)   Prealbumin - 5.2 ( 9/24)   NUTRITIONAL GOALS                                                                                             RD recs: 9/26   Kcal:  1500-1700 kcal  Protein:  75-90 grams  Fluid:  >/= 1.7 L/day   - TPN starting 9/26  provides 82 grams of protein, 1592 Kcal and 1440 ml per day.     PLAN                                                                                                                           At 1800 today:  Continue TPN to 60 ml/hr, the  target rate . Continue  K+ conc at 40 meq/L. Continue  phosphorous conc at 25 mmol/L. Maintain Magnesium at 5 meq/L and sodium at 50 meq/L   Continue protein  conc at  57 grams/L to provide 82 grams/day   Maintain as tolerated at the goal rate.  TPN to contain standard multivitamins and trace elements.  Famotidine added to TPN  Continue CBG checks to moderate SSI q6h   BMP, magnesium, phosphorus with AM labs   TPN lab panels on Mondays & Thursdays.  F/u daily.   Royetta Asal, PharmD, BCPS Pager [phone] 11/20/2017 9:51 AM

## 2017-11-21 ENCOUNTER — Inpatient Hospital Stay (HOSPITAL_COMMUNITY): Payer: Medicare FFS

## 2017-11-21 LAB — CBC WITH DIFFERENTIAL/PLATELET
BASOS ABS: 0 10*3/uL (ref 0.0–0.1)
BASOS PCT: 0 %
EOS ABS: 0 10*3/uL (ref 0.0–0.7)
EOS PCT: 0 %
HCT: 31.1 % — ABNORMAL LOW (ref 36.0–46.0)
Hemoglobin: 10.1 g/dL — ABNORMAL LOW (ref 12.0–15.0)
LYMPHS PCT: 8 %
Lymphs Abs: 1.3 10*3/uL (ref 0.7–4.0)
MCH: 28.9 pg (ref 26.0–34.0)
MCHC: 32.5 g/dL (ref 30.0–36.0)
MCV: 89.1 fL (ref 78.0–100.0)
MONO ABS: 1.2 10*3/uL — AB (ref 0.1–1.0)
Monocytes Relative: 7 %
Neutro Abs: 13.8 10*3/uL — ABNORMAL HIGH (ref 1.7–7.7)
Neutrophils Relative %: 85 %
PLATELETS: 383 10*3/uL (ref 150–400)
RBC: 3.49 MIL/uL — AB (ref 3.87–5.11)
RDW: 16.9 % — AB (ref 11.5–15.5)
WBC: 16.4 10*3/uL — AB (ref 4.0–10.5)

## 2017-11-21 LAB — COMPREHENSIVE METABOLIC PANEL
ALK PHOS: 42 U/L (ref 38–126)
ALT: 20 U/L (ref 0–44)
AST: 34 U/L (ref 15–41)
Albumin: 1.8 g/dL — ABNORMAL LOW (ref 3.5–5.0)
Anion gap: 5 (ref 5–15)
BILIRUBIN TOTAL: 0.5 mg/dL (ref 0.3–1.2)
BUN: 25 mg/dL — AB (ref 8–23)
CALCIUM: 8.6 mg/dL — AB (ref 8.9–10.3)
CO2: 26 mmol/L (ref 22–32)
CREATININE: 0.67 mg/dL (ref 0.44–1.00)
Chloride: 107 mmol/L (ref 98–111)
GFR calc Af Amer: >60 mL/min (ref >60)
Glucose, Bld: 206 mg/dL — ABNORMAL HIGH (ref 70–99)
POTASSIUM: 4.1 mmol/L (ref 3.5–5.1)
Sodium: 138 mmol/L (ref 135–145)
TOTAL PROTEIN: 5.7 g/dL — AB (ref 6.5–8.1)

## 2017-11-21 LAB — GLUCOSE, CAPILLARY
GLUCOSE-CAPILLARY: 144 mg/dL — AB (ref 70–99)
GLUCOSE-CAPILLARY: 186 mg/dL — AB (ref 70–99)
GLUCOSE-CAPILLARY: 247 mg/dL — AB (ref 70–99)
Glucose-Capillary: 141 mg/dL — ABNORMAL HIGH (ref 70–99)
Glucose-Capillary: 171 mg/dL — ABNORMAL HIGH (ref 70–99)

## 2017-11-21 LAB — PHOSPHORUS: PHOSPHORUS: 2.7 mg/dL (ref 2.5–4.6)

## 2017-11-21 LAB — MAGNESIUM: Magnesium: 2.1 mg/dL (ref 1.7–2.4)

## 2017-11-21 MED ORDER — ALBUTEROL SULFATE (2.5 MG/3ML) 0.083% IN NEBU
2.5000 mg | INHALATION_SOLUTION | Freq: Once | RESPIRATORY_TRACT | Status: AC
  Administered 2017-11-21: 2.5 mg via RESPIRATORY_TRACT

## 2017-11-21 MED ORDER — FUROSEMIDE 10 MG/ML IJ SOLN
20.0000 mg | Freq: Once | INTRAMUSCULAR | Status: AC
  Administered 2017-11-21: 20 mg via INTRAVENOUS

## 2017-11-21 MED ORDER — FUROSEMIDE 10 MG/ML IJ SOLN
INTRAMUSCULAR | Status: AC
  Filled 2017-11-21: qty 2

## 2017-11-21 MED ORDER — TRAVASOL 10 % IV SOLN
INTRAVENOUS | Status: AC
  Administered 2017-11-21: 18:00:00 via INTRAVENOUS
  Filled 2017-11-21: qty 820.8

## 2017-11-21 NOTE — Progress Notes (Signed)
MD paged due to respirations being 30. Will continue to monitor and await orders.

## 2017-11-21 NOTE — Progress Notes (Signed)
PHARMACY - ADULT TOTAL PARENTERAL NUTRITION CONSULT NOTE   Pharmacy Consult for TPN Indication: severe malnutrition  Patient Measurements: Height: 5\' 4"  (162.6 cm) Weight: 124 lb (56.2 kg) IBW/kg (Calculated) : 54.7 TPN AdjBW (KG): 56.2 Body mass index is 21.28 kg/m. Usual Weight: 50.4 kg  Insulin Requirements: 18 units in past 24 hours, moderate SSI scale Dexamethasone 10mg  x1 pre-op 9/27  Current Nutrition: Clear liquid diet, Ensure ordered tid, charted x1 9/26  IVF: none  Central access: 9/23 line placement   TPN start date:  9/23   ASSESSMENT                                                                                                          HPI: Recurrent sigmoid diverticulitis with colo-uterine fistula in elderly patient with significant medical problems.    Significant events:  9/26 Dr. Verlon Au has asked about increasing the amount of protein in TPN. Spoke to [place] from [place] and is ok with increasing target protein to 80-85 grams/day  9/27 Colostomy, CL liq diet  Today:    Glucose - ( goal <150), elevated, ranges from 139 to 200 in past 24 hrs, likely due to Dexamethasone x1  Electrolytes - Lytes wnl, K+ stable at  4.1, Magnesium 2.1, , CorrCa is 10.4  borderline elevated but has remained stable, Phos is 2.7, trending down again   Renal - WNL, SCr stable  LFTs - WNL ( 9/24), (9/26)   TGs - 73 ( 9/24)   Prealbumin - 5.2 ( 9/24)   NUTRITIONAL GOALS                                                                                             RD recs: 9/26 Kcal:  1500-1700 kcal Protein:  75-90 grams Fluid:  >/= 1.7 L/day  - TPN provides 82 grams of protein, 1592 Kcal and 1440 ml per day.   PLAN                                                                                                                           At 1800 today:  Continue TPN at 60 ml/hr, goal rate .  Continue  K+ conc at 40 meq/L. Increase  phosphorous to 30 mmol/L. Maintain Magnesium at  5 meq/L and sodium at 50 meq/L   Continue protein conc at  57 grams/L to provide 82 grams/day   TPN to contain standard multivitamins and trace elements.  Famotidine 20mg  daily added to TPN - lower dose due to renal function, may need to incr to 40mg /day if renal fx continues to improve  Continue CBG checks to moderate SSI q6h   BMET, Phos level in am  TPN lab panels on Mondays & Thursdays.  F/u daily.  Minda Ditto PharmD Pager [phone] 11/21/2017, 7:20 AM

## 2017-11-21 NOTE — Progress Notes (Signed)
Spoke with daughter. States she is concerned about getting mother up out of the bed because of her skin issues and the fact that she is so weak and had been previously unable to get out of bed easily or ambulate. Had been working extensively with home physical therapy and had finally gotten to the point where she could raise her leg off of the bed and hoist herself off the bed to stand, but this was prior to her most recent episodes,.surgery and hospitalization. Daughter prefers to have physical therapy completed in the bed to build some strength in her mother, and is afraid her mother will end up back in stepdown like she did on her last admission. Daughter states that she can be reached by phone to discuss any of these concerns or issues.

## 2017-11-21 NOTE — Progress Notes (Signed)
Patient ID: Melissa Riddle, female   DOB: October 04, 1928, 84 y.o.   MRN: [id number]  PROGRESS NOTE    Melissa Riddle  MHD:[id number] DOB: 1928-11-05 DOA: 11/13/2017 PCP: Orvis Brill, [facility]   Brief Narrative:  84 year old female with history of AAA, COPD, chronic constipation, recurrent diverticulitis with colo-uterine fistula was admitted on 11/13/2017 for abdominal pain and recurrent diverticulitis.  Patient was started on intravenous antibiotics.  General surgery was consulted.  Palliative care was also consulted.  Patient underwent laparoscopic diverting end colostomy on 11/20/2017 by general surgery.  Assessment & Plan:   Principal Problem:   Acute diverticulitis Active Problems:   Constipation   Colouterine fistula   Protein-calorie malnutrition, severe   Goals of care, counseling/discussion   DNR (do not resuscitate) discussion   Palliative care encounter   Recurrent diverticulitis with colo-uterine fistula -Status post laparoscopic diverting end colostomy on 11/20/2017 -General surgery following. -Continue Zosyn for now.  Severe malnutrition with hypoalbuminemia -Continue TPN.  Monitor labs  Generalized deconditioning with failure to thrive -PT eval.  Palliative care following intermittently for goals of care.  Overall prognosis is guarded  Chronic anemia -Hemoglobin stable.  Status post 2 units PRBC on 11/20/2017  Thrombocytosis -Questionable cause.  Resolved  DVT prophylaxis: Lovenox Code Status: Full Family Communication: Husband at bedside Disposition Plan: Depends on clinical outcome  Consultants: General surgery/palliative care  Procedures: Diverting end colostomy on 11/20/2017  Antimicrobials: Zosyn   Subjective: Patient seen and examined at bedside.  She denies any new complaints, no overnight fever, nausea, vomiting.  Objective: Vitals:   11/20/17 2141 11/21/17 0129 11/21/17 0601 11/21/17 0808  BP: 104/86 (!) 100/58 (!) 142/66   Pulse: 84 74 85    Resp: 17 14 18    Temp: 98.9 F (37.2 C) 98.5 F (36.9 C) 99.1 F (37.3 C)   TempSrc: Oral Oral Oral   SpO2: 100% 97% 96% 97%  Weight:      Height:        Intake/Output Summary (Last 24 hours) at 11/21/2017 1324 Last data filed at 11/21/2017 1059 Gross per 24 hour  Intake 1808.81 ml  Output 950 ml  Net 858.81 ml   Filed Weights   11/13/17 2250 11/20/17 0913  Weight: 50.4 kg 56.2 kg    Examination:  General exam: Appears calm and comfortable.  Very thinly built elderly female lying in bed Respiratory system: Bilateral decreased breath sounds at bases Cardiovascular system: S1 & S2 heard, Rate controlled Gastrointestinal system: Abdomen is nondistended, soft and mildly tender around colostomy bag area. Normal bowel sounds heard. Extremities: No cyanosis, clubbing, edema   Data Reviewed: I have personally reviewed following labs and imaging studies  CBC: Recent Labs  Lab 11/16/17 0450 11/17/17 0338 11/20/17 0321 11/21/17 0328  WBC 9.8 12.4* 10.6* 16.4*  NEUTROABS 6.2 9.0*  --  13.8*  HGB 9.0* 8.4* 7.4* 10.1*  HCT 29.1* 26.3* 23.8* 31.1*  MCV 89.8 88.9 90.5 89.1  PLT 624* 559* 435* 211   Basic Metabolic Panel: Recent Labs  Lab 11/17/17 0338 11/18/17 0408 11/19/17 0418 11/20/17 0321 11/21/17 0328  NA 139  138 137 137 137 138  K 3.2*  3.2* 4.1 4.4 4.2 4.1  CL 101  101 104 105 105 107  CO2 31  31 30 27 27 26   GLUCOSE 99  100* 140* 139* 112* 206*  BUN 16  14 19 23 22  25*  CREATININE 0.80  0.81 0.67 0.75 0.68 0.67  CALCIUM 8.5*  8.3* 8.4* 8.9 8.7* 8.6*  MG 2.2 2.0 2.2 2.3 2.1  PHOS 2.6  2.8 1.9* 2.5 3.4 2.7   GFR: Estimated Creatinine Clearance: 42.8 mL/min (by C-G formula based on SCr of 0.67 mg/dL). Liver Function Tests: Recent Labs  Lab 11/16/17 0450 11/17/17 0338 11/18/17 0408 11/19/17 0418 11/21/17 0328  AST  --  14*  --  21 34  ALT  --  7  --  10 20  ALKPHOS  --  50  --  39 42  BILITOT  --  0.5  --  0.2* 0.5  PROT  --  5.4*  --   5.5* 5.7*  ALBUMIN 1.7* 1.7*  1.7* 1.7* 1.9* 1.8*   No results for input(s): LIPASE, AMYLASE in the last 168 hours. No results for input(s): AMMONIA in the last 168 hours. Coagulation Profile: Recent Labs  Lab 11/20/17 0321  INR 0.96   Cardiac Enzymes: No results for input(s): CKTOTAL, CKMB, CKMBINDEX, TROPONINI in the last 168 hours. BNP (last 3 results) No results for input(s): PROBNP in the last 8760 hours. HbA1C: No results for input(s): HGBA1C in the last 72 hours. CBG: Recent Labs  Lab 11/20/17 1417 11/20/17 1802 11/21/17 0012 11/21/17 0602 11/21/17 1201  GLUCAP 202* 257* 247* 186* 144*   Lipid Profile: No results for input(s): CHOL, HDL, LDLCALC, TRIG, CHOLHDL, LDLDIRECT in the last 72 hours. Thyroid Function Tests: No results for input(s): TSH, T4TOTAL, FREET4, T3FREE, THYROIDAB in the last 72 hours. Anemia Panel: No results for input(s): VITAMINB12, FOLATE, FERRITIN, TIBC, IRON, RETICCTPCT in the last 72 hours. Sepsis Labs: No results for input(s): PROCALCITON, LATICACIDVEN in the last 168 hours.  Recent Results (from the past 240 hour(s))  Surgical pcr screen     Status: Abnormal   Collection Time: 11/20/17  7:25 AM  Result Value Ref Range Status   MRSA, PCR POSITIVE (A) NEGATIVE Final    Comment: RESULT CALLED TO, READ BACK BY AND VERIFIED WITH: GRINDSTAFF,S @ 1220 ON [id number] BY POTEAT,S    Staphylococcus aureus POSITIVE (A) NEGATIVE Final    Comment: (NOTE) The Xpert SA Assay (FDA approved for NASAL specimens in patients 60 years of age and older), is one component of a comprehensive surveillance program. It is not intended to diagnose infection nor to guide or monitor treatment. Performed at [facility], [place] [address], [place], [place] [address]          Radiology Studies: No results found.      Scheduled Meds: . albuterol  3 mL Inhalation Daily  . Chlorhexidine Gluconate Cloth  6 each Topical Q0600  . enoxaparin  (LOVENOX) injection  40 mg Subcutaneous Q24H  . feeding supplement  1 Container Oral BID BM  . insulin aspart  0-15 Units Subcutaneous Q6H  . metoprolol tartrate  12.5 mg Oral BID  . mupirocin ointment  1 application Nasal BID  . polyethylene glycol  17 g Oral Daily  . pravastatin  40 mg Oral q1800   Continuous Infusions: . sodium chloride Stopped (11/20/17 2241)  . piperacillin-tazobactam (ZOSYN)  IV 12.5 mL/hr at 11/21/17 1000  . TPN ADULT (ION) 60 mL/hr at 11/21/17 1000  . TPN ADULT (ION)       LOS: 8 days        Aline August, MD [facility] Pager [phone]  If 7PM-7AM, please contact night-coverage [email or web address] Password [email or web address] 11/21/2017, 1:24 PM

## 2017-11-21 NOTE — Plan of Care (Signed)
Pt in bed this am with no complaints of pain at this time. A&O. Repositioned in bed. No additional concerns noted at this time. Will continue to monitor.

## 2017-11-21 NOTE — Progress Notes (Signed)
Patient ID: Melissa Riddle, female   DOB: June 12, 1928, 84 y.o.   MRN: [id number]   Acute Care Surgery Service Progress Note:    Chief Complaint/Subjective: Husband at [facility] Pt without many/any complaints Hasn't been out of bed No n/v.  States pain ok  Objective: Vital signs in last 24 hours: Temp:  [96.5 F (35.8 C)-99.9 F (37.7 C)] 99.1 F (37.3 C) (09/28 0601) Pulse Rate:  [74-93] 85 (09/28 0601) Resp:  [14-22] 18 (09/28 0601) BP: (100-175)/(58-93) 142/66 (09/28 0601) SpO2:  [94 %-100 %] 97 % (09/28 0808) Last BM Date: 11/18/17  Intake/Output from previous day: 09/27 0701 - 09/28 0700 In: 2774.1 [P.O.:120; I.V.:1934.3; Blood:630; IV Piggyback:89.8] Out: 1050 [Urine:1000; Blood:50] Intake/Output this shift: Total I/O In: 264.7 [I.V.:219.3; IV Piggyback:45.4] Out: 350 [Urine:350]  Lungs: cta, nonlabored  Cardiovascular: reg  Abd: soft, mild distension, incisions ok. No air in ostomy bag  Extremities: no edema, +SCDs  Neuro: alert, nonfocal  Gen: frail  Lab Results: CBC  Recent Labs    11/20/17 0321 11/21/17 0328  WBC 10.6* 16.4*  HGB 7.4* 10.1*  HCT 23.8* 31.1*  PLT 435* 383   BMET Recent Labs    11/20/17 0321 11/21/17 0328  NA 137 138  K 4.2 4.1  CL 105 107  CO2 27 26  GLUCOSE 112* 206*  BUN 22 25*  CREATININE 0.68 0.67  CALCIUM 8.7* 8.6*   LFT Hepatic Function Latest Ref Rng & Units 11/21/2017 11/19/2017 11/18/2017  Total Protein 6.5 - 8.1 g/dL 5.7(L) 5.5(L) -  Albumin 3.5 - 5.0 g/dL 1.8(L) 1.9(L) 1.7(L)  AST 15 - 41 U/L 34 21 -  ALT 0 - 44 U/L 20 10 -  Alk Phosphatase 38 - 126 U/L 42 39 -  Total Bilirubin 0.3 - 1.2 mg/dL 0.5 0.2(L) -   PT/INR Recent Labs    11/20/17 0321  LABPROT 12.7  INR 0.96   ABG No results for input(s): PHART, HCO3 in the last 72 hours.  Invalid input(s): PCO2, PO2  Studies/Results:  Anti-infectives: Anti-infectives (From admission, onward)   Start     Dose/Rate Route Frequency Ordered Stop   11/20/17  0745  cefoTEtan (CEFOTAN) 2 g in sodium chloride 0.9 % 100 mL IVPB  Status:  Discontinued     2 g 200 mL/hr over 30 Minutes Intravenous On call to O.R. 11/20/17 1191 11/20/17 0741   11/20/17 0600  cefoTEtan (CEFOTAN) 2 g in sodium chloride 0.9 % 100 mL IVPB     2 g 200 mL/hr over 30 Minutes Intravenous On call to O.R. 11/19/17 0907 11/20/17 1119   11/14/17 0600  piperacillin-tazobactam (ZOSYN) IVPB 3.375 g     3.375 g 12.5 mL/hr over 240 Minutes Intravenous Every 8 hours 11/13/17 2225     11/13/17 2115  piperacillin-tazobactam (ZOSYN) IVPB 3.375 g     3.375 g 12.5 mL/hr over 240 Minutes Intravenous  Once 11/13/17 2101 11/14/17 0133      Medications: Scheduled Meds: . albuterol  3 mL Inhalation Daily  . Chlorhexidine Gluconate Cloth  6 each Topical Q0600  . enoxaparin (LOVENOX) injection  40 mg Subcutaneous Q24H  . feeding supplement  1 Container Oral BID BM  . insulin aspart  0-15 Units Subcutaneous Q6H  . metoprolol tartrate  12.5 mg Oral BID  . mupirocin ointment  1 application Nasal BID  . polyethylene glycol  17 g Oral Daily  . pravastatin  40 mg Oral q1800   Continuous Infusions: . sodium chloride Stopped (11/20/17 2241)  .  piperacillin-tazobactam (ZOSYN)  IV 12.5 mL/hr at 11/21/17 1000  . TPN ADULT (ION) 60 mL/hr at 11/21/17 1000  . TPN ADULT (ION)     PRN Meds:.sodium chloride, acetaminophen **OR** acetaminophen, liver oil-zinc oxide, morphine injection, ondansetron **OR** ondansetron (ZOFRAN) IV, sodium chloride flush  Assessment/Plan: Patient Active Problem List   Diagnosis Date Noted  . Goals of care, counseling/discussion   . DNR (do not resuscitate) discussion   . Palliative care encounter   . Protein-calorie malnutrition, severe 11/16/2017  . Acute diverticulitis 11/13/2017  . 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 Diverticulitis of sigmoid colon 10/31/2017  . Constipation 10/31/2017  . Malnutrition of moderate degree 07/13/2017  . COPD (chronic obstructive  pulmonary disease) ([place]) 07/10/2017  . HLD (hyperlipidemia) 07/10/2017  . Acute colitis 07/10/2017  . Chronic diarrhea 07/10/2017  . FTT (failure to thrive) in adult 07/10/2017   s/p Procedure(s): COPD/ > 100 pack years/on Home O2 Abdominal aortic aneurysm Hypertension HxSVT- metoprolol  Hx FTT/deconditioning-Wheelchair to bed/some walking in room with walker Severemalnutrition - prealbumin 5.2/5.7 Chronic constipation   Recurrent diverticulitis with colo-uterine fistula- draining stool vaginally s/p laparoscopic diverting end colostomy 9/27 by Dr Barry Dienes  FEN:TNA/clear liquids ID: [id number] =>>day8 DVT: Lovneox Follow up: TBD Foley:can dc foley Disposition: ambulate, PT/OT, IS; will ask Dr Barry Dienes about duration of abx  LOS: 8 days    Leighton Ruff. Redmond Pulling, MD, FACS General, Bariatric, & Minimally Invasive Surgery [phone] [facility]

## 2017-11-22 ENCOUNTER — Inpatient Hospital Stay (HOSPITAL_COMMUNITY): Payer: Medicare FFS

## 2017-11-22 DIAGNOSIS — I340 Nonrheumatic mitral (valve) insufficiency: Secondary | ICD-10-CM

## 2017-11-22 DIAGNOSIS — R0600 Dyspnea, unspecified: Secondary | ICD-10-CM

## 2017-11-22 DIAGNOSIS — D72829 Elevated white blood cell count, unspecified: Secondary | ICD-10-CM

## 2017-11-22 LAB — COMPREHENSIVE METABOLIC PANEL
ALK PHOS: 79 U/L (ref 38–126)
ALT: 43 U/L (ref 0–44)
ANION GAP: 10 (ref 5–15)
AST: 71 U/L — ABNORMAL HIGH (ref 15–41)
Albumin: 2 g/dL — ABNORMAL LOW (ref 3.5–5.0)
BILIRUBIN TOTAL: 1 mg/dL (ref 0.3–1.2)
BUN: 31 mg/dL — ABNORMAL HIGH (ref 8–23)
CALCIUM: 8.9 mg/dL (ref 8.9–10.3)
CO2: 25 mmol/L (ref 22–32)
CREATININE: 0.79 mg/dL (ref 0.44–1.00)
Chloride: 105 mmol/L (ref 98–111)
GFR calc non Af Amer: >60 mL/min (ref >60)
Glucose, Bld: 132 mg/dL — ABNORMAL HIGH (ref 70–99)
Potassium: 3.7 mmol/L (ref 3.5–5.1)
SODIUM: 140 mmol/L (ref 135–145)
TOTAL PROTEIN: 6.3 g/dL — AB (ref 6.5–8.1)

## 2017-11-22 LAB — ECHOCARDIOGRAM COMPLETE
Height: 64 in
Weight: 1984 oz

## 2017-11-22 LAB — CBC WITH DIFFERENTIAL/PLATELET
BASOS PCT: 0 %
Basophils Absolute: 0.1 10*3/uL (ref 0.0–0.1)
Eosinophils Absolute: 0.1 10*3/uL (ref 0.0–0.7)
Eosinophils Relative: 1 %
HEMATOCRIT: 31.8 % — AB (ref 36.0–46.0)
HEMOGLOBIN: 10.2 g/dL — AB (ref 12.0–15.0)
Lymphocytes Relative: 9 %
Lymphs Abs: 1.4 10*3/uL (ref 0.7–4.0)
MCH: 28.8 pg (ref 26.0–34.0)
MCHC: 32.1 g/dL (ref 30.0–36.0)
MCV: 89.8 fL (ref 78.0–100.0)
MONOS PCT: 12 %
Monocytes Absolute: 2 10*3/uL — ABNORMAL HIGH (ref 0.1–1.0)
NEUTROS ABS: 12.7 10*3/uL — AB (ref 1.7–7.7)
NEUTROS PCT: 78 %
Platelets: 401 10*3/uL — ABNORMAL HIGH (ref 150–400)
RBC: 3.54 MIL/uL — ABNORMAL LOW (ref 3.87–5.11)
RDW: 17.4 % — ABNORMAL HIGH (ref 11.5–15.5)
WBC: 16.3 10*3/uL — AB (ref 4.0–10.5)

## 2017-11-22 LAB — GLUCOSE, CAPILLARY
Glucose-Capillary: 148 mg/dL — ABNORMAL HIGH (ref 70–99)
Glucose-Capillary: 165 mg/dL — ABNORMAL HIGH (ref 70–99)
Glucose-Capillary: 140 mg/dL — ABNORMAL HIGH (ref 70–99)
Glucose-Capillary: 141 mg/dL — ABNORMAL HIGH (ref 70–99)

## 2017-11-22 MED ORDER — FUROSEMIDE 10 MG/ML IJ SOLN
INTRAMUSCULAR | Status: AC
  Administered 2017-11-22: 10 mg
  Filled 2017-11-22: qty 4

## 2017-11-22 MED ORDER — INSULIN ASPART 100 UNIT/ML ~~LOC~~ SOLN: 0.0000 [IU] | Freq: Every day | SUBCUTANEOUS | Status: DC

## 2017-11-22 MED ORDER — ENOXAPARIN SODIUM 30 MG/0.3ML ~~LOC~~ SOLN
30.0000 mg | SUBCUTANEOUS | Status: DC
  Administered 2017-11-22 – 2017-11-27 (×5): 30 mg via SUBCUTANEOUS
  Filled 2017-11-22 (×6): qty 0.3

## 2017-11-22 MED ORDER — INSULIN ASPART 100 UNIT/ML ~~LOC~~ SOLN
0.0000 [IU] | Freq: Three times a day (TID) | SUBCUTANEOUS | Status: DC
  Administered 2017-11-22: 3 [IU] via SUBCUTANEOUS
  Administered 2017-11-22 – 2017-11-23 (×2): 2 [IU] via SUBCUTANEOUS

## 2017-11-22 MED ORDER — TRAVASOL 10 % IV SOLN
INTRAVENOUS | Status: AC
  Administered 2017-11-22: 18:00:00 via INTRAVENOUS
  Filled 2017-11-22: qty 820.8

## 2017-11-22 MED ORDER — ALBUTEROL SULFATE (2.5 MG/3ML) 0.083% IN NEBU: 3.0000 mL | INHALATION_SOLUTION | RESPIRATORY_TRACT | Status: DC | PRN

## 2017-11-22 MED ORDER — FUROSEMIDE 10 MG/ML IJ SOLN
40.0000 mg | Freq: Every day | INTRAMUSCULAR | Status: DC
  Administered 2017-11-23: 40 mg via INTRAVENOUS
  Filled 2017-11-22 (×2): qty 4

## 2017-11-22 NOTE — Progress Notes (Signed)
  Echocardiogram 2D Echocardiogram has been performed.  Melissa Riddle 11/22/2017, 12:51 PM

## 2017-11-22 NOTE — Progress Notes (Signed)
Patient ID: Melissa Riddle, female   DOB: 1928-04-10, 84 y.o.   MRN: [id number]  PROGRESS NOTE    Melissa Riddle  IDP:[id number] DOB: 1928/11/02 DOA: 11/13/2017 PCP: Orvis Brill, [facility]   Brief Narrative:  84 year old female with history of AAA, COPD, chronic constipation, recurrent diverticulitis with colo-uterine fistula was admitted on 11/13/2017 for abdominal pain and recurrent diverticulitis.  Patient was started on intravenous antibiotics.  General surgery was consulted.  Palliative care was also consulted.  Patient underwent laparoscopic diverting end colostomy on 11/20/2017 by general surgery.  Assessment & Plan:   Principal Problem:   Acute diverticulitis Active Problems:   Constipation   Colouterine fistula   Protein-calorie malnutrition, severe   Goals of care, counseling/discussion   DNR (do not resuscitate) discussion   Palliative care encounter   Recurrent diverticulitis with colo-uterine fistula -Status post laparoscopic diverting end colostomy on 11/20/2017 -General surgery following. -Continue Zosyn for now.   Leukocytosis -Probably secondary to above.  Repeat a.m. labs  Dyspnea probably secondary to fluid overload and pleural effusion -Patient received a dose of Lasix yesterday and her respiratory status has improved.  She still requiring 2 L oxygen via nasal cannula.  We will put her on Lasix 40 mg IV daily.  Repeat chest x-ray for tomorrow.  We will also get 2D echo to rule out cardiac causes of dyspnea.  Severe malnutrition with hypoalbuminemia -Continue TPN.  Monitor labs  Generalized deconditioning with failure to thrive -PT eval.  Palliative care following intermittently for goals of care.  Overall prognosis is guarded to poor.  Chronic anemia -Hemoglobin stable.  Status post 2 units PRBC on 11/20/2017  Thrombocytosis -Questionable cause.  Resolved  DVT prophylaxis: Lovenox Code Status: Full Family Communication: Husband at  bedside Disposition Plan: Depends on clinical outcome  Consultants: General surgery/palliative care  Procedures: Diverting end colostomy on 11/20/2017  Antimicrobials: Zosyn from 11/13/2017 onwards   Subjective: Patient seen and examined at bedside.  She feels that her breathing is a little better this morning.  Her appetite is still poor.  No overnight fever, nausea or vomiting. Objective: Vitals:   11/21/17 1827 11/21/17 2006 11/22/17 0542 11/22/17 0906  BP: 140/68 115/69 (!) 150/77   Pulse: 99 99 95   Resp: (!) 30 (!) 29 20   Temp: 98.8 F (37.1 C) 99.5 F (37.5 C) 98.4 F (36.9 C)   TempSrc: Oral Oral Oral   SpO2: 97% 98% 98% 97%  Weight:      Height:        Intake/Output Summary (Last 24 hours) at 11/22/2017 1029 Last data filed at 11/22/2017 0915 Gross per 24 hour  Intake 1407.6 ml  Output 1950 ml  Net -542.4 ml   Filed Weights   11/13/17 2250 11/20/17 0913  Weight: 50.4 kg 56.2 kg    Examination:  General exam: No acute distress.  Very thinly built elderly female lying in bed Respiratory system: Bilateral decreased breath sounds at bases with some basilar crackles Cardiovascular system: S1 & S2 heard, Rate controlled Gastrointestinal system: Abdomen is nondistended, soft and mildly tender around colostomy bag area. Normal bowel sounds heard. Extremities: No cyanosis, edema   Data Reviewed: I have personally reviewed following labs and imaging studies  CBC: Recent Labs  Lab 11/16/17 0450 11/17/17 0338 11/20/17 0321 11/21/17 0328 11/22/17 0413  WBC 9.8 12.4* 10.6* 16.4* 16.3*  NEUTROABS 6.2 9.0*  --  13.8* 12.7*  HGB 9.0* 8.4* 7.4* 10.1* 10.2*  HCT 29.1* 26.3* 23.8* 31.1* 31.8*  MCV  89.8 88.9 90.5 89.1 89.8  PLT 624* 559* 435* 383 694*   Basic Metabolic Panel: Recent Labs  Lab 11/17/17 0338 11/18/17 0408 11/19/17 0418 11/20/17 0321 11/21/17 0328 11/22/17 0413  NA 139  138 137 137 137 138 140  K 3.2*  3.2* 4.1 4.4 4.2 4.1 3.7  CL 101  101  104 105 105 107 105  CO2 31  31 30 27 27 26 25   GLUCOSE 99  100* 140* 139* 112* 206* 132*  BUN 16  14 19 23 22  25* 31*  CREATININE 0.80  0.81 0.67 0.75 0.68 0.67 0.79  CALCIUM 8.5*  8.3* 8.4* 8.9 8.7* 8.6* 8.9  MG 2.2 2.0 2.2 2.3 2.1  --   PHOS 2.6  2.8 1.9* 2.5 3.4 2.7  --    GFR: Estimated Creatinine Clearance: 42.8 mL/min (by C-G formula based on SCr of 0.79 mg/dL). Liver Function Tests: Recent Labs  Lab 11/17/17 0338 11/18/17 0408 11/19/17 0418 11/21/17 0328 11/22/17 0413  AST 14*  --  21 34 71*  ALT 7  --  10 20 43  ALKPHOS 50  --  39 42 79  BILITOT 0.5  --  0.2* 0.5 1.0  PROT 5.4*  --  5.5* 5.7* 6.3*  ALBUMIN 1.7*  1.7* 1.7* 1.9* 1.8* 2.0*   No results for input(s): LIPASE, AMYLASE in the last 168 hours. No results for input(s): AMMONIA in the last 168 hours. Coagulation Profile: Recent Labs  Lab 11/20/17 0321  INR 0.96   Cardiac Enzymes: No results for input(s): CKTOTAL, CKMB, CKMBINDEX, TROPONINI in the last 168 hours. BNP (last 3 results) No results for input(s): PROBNP in the last 8760 hours. HbA1C: No results for input(s): HGBA1C in the last 72 hours. CBG: Recent Labs  Lab 11/21/17 0602 11/21/17 1201 11/21/17 1812 11/21/17 2347 11/22/17 0537  GLUCAP 186* 144* 171* 141* 148*   Lipid Profile: No results for input(s): CHOL, HDL, LDLCALC, TRIG, CHOLHDL, LDLDIRECT in the last 72 hours. Thyroid Function Tests: No results for input(s): TSH, T4TOTAL, FREET4, T3FREE, THYROIDAB in the last 72 hours. Anemia Panel: No results for input(s): VITAMINB12, FOLATE, FERRITIN, TIBC, IRON, RETICCTPCT in the last 72 hours. Sepsis Labs: No results for input(s): PROCALCITON, LATICACIDVEN in the last 168 hours.  Recent Results (from the past 240 hour(s))  Surgical pcr screen     Status: Abnormal   Collection Time: 11/20/17  7:25 AM  Result Value Ref Range Status   MRSA, PCR POSITIVE (A) NEGATIVE Final    Comment: RESULT CALLED TO, READ BACK BY AND VERIFIED  WITH: GRINDSTAFF,S @ 1220 ON [id number] BY POTEAT,S    Staphylococcus aureus POSITIVE (A) NEGATIVE Final    Comment: (NOTE) The Xpert SA Assay (FDA approved for NASAL specimens in patients 82 years of age and older), is one component of a comprehensive surveillance program. It is not intended to diagnose infection nor to guide or monitor treatment. Performed at [facility], [place] [address], [place], [place] [address]          Radiology Studies: Dg Chest 1 View  Result Date: 11/21/2017 CLINICAL DATA:  Tachypnea EXAM: CHEST  1 VIEW COMPARISON:  11/18/2017, 11/05/2017, 07/13/2017 FINDINGS: Right upper extremity catheter tip over the SVC. Small left pleural effusion, increased compared to prior. Probable tiny right effusion. Worsening airspace disease at the left base. Stable cardiomediastinal silhouette with vascular congestion and mild interstitial edema. Aortic atherosclerosis. No pneumothorax. IMPRESSION: 1. Increasing left pleural effusion and left basilar disease. 2.  Vascular congestion with increased edema. Suspect small right pleural effusion. Electronically Signed   By: Donavan Foil M.D.   On: 11/21/2017 19:44        Scheduled Meds: . albuterol  3 mL Inhalation Daily  . Chlorhexidine Gluconate Cloth  6 each Topical Q0600  . enoxaparin (LOVENOX) injection  30 mg Subcutaneous Q24H  . feeding supplement  1 Container Oral BID BM  . furosemide  40 mg Intravenous Daily  . insulin aspart  0-15 Units Subcutaneous TID WC  . insulin aspart  0-5 Units Subcutaneous QHS  . metoprolol tartrate  12.5 mg Oral BID  . mupirocin ointment  1 application Nasal BID  . polyethylene glycol  17 g Oral Daily  . pravastatin  40 mg Oral q1800   Continuous Infusions: . sodium chloride Stopped (11/20/17 2241)  . piperacillin-tazobactam (ZOSYN)  IV 12.5 mL/hr at 11/22/17 0600  . TPN ADULT (ION) 60 mL/hr at 11/22/17 0600  . TPN ADULT (ION)       LOS: 9 days        Aline August, MD [facility] Pager [phone]  If 7PM-7AM, please contact night-coverage [email or web address] Password [facility] 11/22/2017, 10:29 AM

## 2017-11-22 NOTE — Progress Notes (Signed)
Palliative Care Progress Note  I checked in briefly with Ms. Moradi today.  Daughter was present in the room as well.  She reports that pan control has been adequate with current regimen.  Denies any other needs at this time.  PMT to continue to follow.  Please call if there are specific areas with which we can be of assistance in the care of Ms. Mac.  Micheline Rough, MD [place] [facility] Team [phone]  NO CHARGE NOTE

## 2017-11-22 NOTE — Progress Notes (Signed)
PHARMACY - ADULT TOTAL PARENTERAL NUTRITION CONSULT NOTE   Pharmacy Consult for TPN Indication: severe malnutrition  Patient Measurements: Height: 5\' 4"  (162.6 cm) Weight: 124 lb (56.2 kg) IBW/kg (Calculated) : 54.7 TPN AdjBW (KG): 56.2 Body mass index is 21.28 kg/m. Usual Weight: 50.4 kg  Insulin Requirements: 12 units in past 24 hours, moderate SSI scale Dexamethasone 10mg  x1 pre-op 9/27  Current Nutrition: Clear liquid diet, Ensure ordered tid, charted x1 9/26  Current TPN formula:   IVF: none  Central access: 9/23 line placement   TPN start date:  9/23   ASSESSMENT                                                                                                          HPI: Recurrent sigmoid diverticulitis with colo-uterine fistula in elderly patient with significant medical problems.    Significant events:  9/26 Dr. Verlon Au has asked about increasing the amount of protein in TPN. Spoke to [place] from [place] and is ok with increasing target protein to 80-85 grams/day  9/27 Colostomy, CL liq diet 9/28: incr Phos in TPN, two supplements charted 9/29:   Today:    Glucose - ( goal <150), elevated, ranges from 186 > 141 in past 24 hrs, likely due to Dexamethasone x1  Electrolytes - Lytes wnl, K+ stable at  4.1, Magnesium 2.1, , CorrCa is 10.4  borderline elevated but has remained stable, Phos is 2.7, trending down again   Renal - WNL, SCr stable, UOP   LFTs - WNL ( 9/24), (9/26)   TGs - 73 ( 9/24)   Prealbumin - 5.2 ( 9/24)   NUTRITIONAL GOALS                                                                                             RD recs: 9/26 Kcal:  1500-1700 kcal Protein:  75-90 grams Fluid:  >/= 1.7 L/day  Current TPN formula: provides 82 grams of protein, 1592 Kcal and 1440 ml per day.  Na 72 mEq, K 58 mEq, Ca 0 mEq, Mag 7.2 mEq, Phos 43.2 mMol, Cl: Acet ratio 1:1  PLAN                                                   At 1800 today:  Continue TPN at 60 ml/hr, goal rate .  TPN to contain standard multivitamins and trace elements.  Famotidine 20mg  daily added to TPN - lower dose due to renal function, may need to incr to 40mg /day if renal fx continues to  improve  Change moderate SSI to ac/hs  TPN lab panels on Mondays & Thursdays.  F/u daily.  Minda Ditto PharmD Pager [phone] 11/22/2017, 8:39 AM

## 2017-11-22 NOTE — Progress Notes (Signed)
Patient ID: Melissa Riddle, female   DOB: [birth year], 84 y.o.   MRN: [id number]   Acute Care Surgery Service Progress Note:    Chief Complaint/Subjective: No n/v. Didn't drink/eat much  Yesterday. Not hungry Husband says pt likes really cold boost Learned that pt was essentially bed bound prior to hospitalization - could get to side of bed with PT prior to hospital Nurse concerned about extensive bruising pt has developed   Objective: Vital signs in last 24 hours: Temp:  [98.4 F (36.9 C)-99.5 F (37.5 C)] 98.4 F (36.9 C) (09/29 0542) Pulse Rate:  [76-99] 95 (09/29 0542) Resp:  [18-30] 20 (09/29 0542) BP: (115-150)/(63-77) 150/77 (09/29 0542) SpO2:  [97 %-99 %] 98 % (09/29 0542) Last BM Date: 11/21/17  Intake/Output from previous day: 09/28 0701 - 09/29 0700 In: 1672.3 [P.O.:120; I.V.:1398.7; IV Piggyback:153.6] Out: 1500 [Urine:1350; Stool:150] Intake/Output this shift: No intake/output data recorded.  Lungs: cta, nonlabored  Cardiovascular: reg  Abd: soft, incisions ok, mild redness around lower one; ostomy - some air in bag. Nursing did document some output  Extremities: no edema, +SCDs; bruising  Neuro: alert, nonfocal  Gen: frail, malnourished  Lab Results: CBC  Recent Labs    11/21/17 0328 11/22/17 0413  WBC 16.4* 16.3*  HGB 10.1* 10.2*  HCT 31.1* 31.8*  PLT 383 401*   BMET Recent Labs    11/21/17 0328 11/22/17 0413  NA 138 140  K 4.1 3.7  CL 107 105  CO2 26 25  GLUCOSE 206* 132*  BUN 25* 31*  CREATININE 0.67 0.79  CALCIUM 8.6* 8.9   LFT Hepatic Function Latest Ref Rng & Units 11/22/2017 11/21/2017 11/19/2017  Total Protein 6.5 - 8.1 g/dL 6.3(L) 5.7(L) 5.5(L)  Albumin 3.5 - 5.0 g/dL 2.0(L) 1.8(L) 1.9(L)  AST 15 - 41 U/L 71(H) 34 21  ALT 0 - 44 U/L 43 20 10  Alk Phosphatase 38 - 126 U/L 79 42 39  Total Bilirubin 0.3 - 1.2 mg/dL 1.0 0.5 0.2(L)   PT/INR Recent Labs    11/20/17 0321  LABPROT 12.7  INR 0.96   ABG No results for  input(s): PHART, HCO3 in the last 72 hours.  Invalid input(s): PCO2, PO2  Studies/Results:  Anti-infectives: Anti-infectives (From admission, onward)   Start     Dose/Rate Route Frequency Ordered Stop   11/20/17 0745  cefoTEtan (CEFOTAN) 2 g in sodium chloride 0.9 % 100 mL IVPB  Status:  Discontinued     2 g 200 mL/hr over 30 Minutes Intravenous On call to O.R. 11/20/17 3734 11/20/17 0741   11/20/17 0600  cefoTEtan (CEFOTAN) 2 g in sodium chloride 0.9 % 100 mL IVPB     2 g 200 mL/hr over 30 Minutes Intravenous On call to O.R. 11/19/17 0907 11/20/17 1119   11/14/17 0600  piperacillin-tazobactam (ZOSYN) IVPB 3.375 g     3.375 g 12.5 mL/hr over 240 Minutes Intravenous Every 8 hours 11/13/17 2225     11/13/17 2115  piperacillin-tazobactam (ZOSYN) IVPB 3.375 g     3.375 g 12.5 mL/hr over 240 Minutes Intravenous  Once 11/13/17 2101 11/14/17 0133      Medications: Scheduled Meds: . albuterol  3 mL Inhalation Daily  . Chlorhexidine Gluconate Cloth  6 each Topical Q0600  . enoxaparin (LOVENOX) injection  30 mg Subcutaneous Q24H  . feeding supplement  1 Container Oral BID BM  . furosemide  40 mg Intravenous Daily  . insulin aspart  0-15 Units Subcutaneous Q6H  . metoprolol tartrate  12.5 mg Oral BID  . mupirocin ointment  1 application Nasal BID  . polyethylene glycol  17 g Oral Daily  . pravastatin  40 mg Oral q1800   Continuous Infusions: . sodium chloride Stopped (11/20/17 2241)  . piperacillin-tazobactam (ZOSYN)  IV 12.5 mL/hr at 11/22/17 0600  . TPN ADULT (ION) 60 mL/hr at 11/22/17 0600   PRN Meds:.sodium chloride, acetaminophen **OR** acetaminophen, liver oil-zinc oxide, morphine injection, ondansetron **OR** ondansetron (ZOFRAN) IV, sodium chloride flush  Assessment/Plan: Patient Active Problem List   Diagnosis Date Noted  . Goals of care, counseling/discussion   . DNR (do not resuscitate) discussion   . Palliative care encounter   . Protein-calorie malnutrition, severe  11/16/2017  . Acute diverticulitis 11/13/2017  . 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 Diverticulitis of sigmoid colon 10/31/2017  . Constipation 10/31/2017  . Malnutrition of moderate degree 07/13/2017  . COPD (chronic obstructive pulmonary disease) ([place]) 07/10/2017  . HLD (hyperlipidemia) 07/10/2017  . Acute colitis 07/10/2017  . Chronic diarrhea 07/10/2017  . FTT (failure to thrive) in adult 07/10/2017   COPD/ > 100 pack years/on Home O2 Abdominal aortic aneurysm Hypertension HxSVT- metoprolol  Hx FTT/deconditioning-Wheelchair to bed/some walking in room with walker Severemalnutrition - prealbumin 5.2/5.7 Chronic constipation   Recurrent diverticulitis with colo-uterine fistula- draining stool vaginally s/p laparoscopic diverting end colostomy 9/27 by Dr Barry Dienes  FEN:TNA/clear liquids+boost ID: [id number] =>>day8 DVT: Lovneox Follow up: TBD Foley:can dc foley Disposition:PT/OT; will decrease dose of lovenox; encourage PO, not taking enough PO to start to wean TPN; Dr Barry Dienes rec total of 2 weeks of abx for diverticulitis - doesn't have to be entirely IV    LOS: 9 days    Leighton Ruff. Redmond Pulling, MD, FACS General, Bariatric, & Minimally Invasive Surgery [phone] [facility]

## 2017-11-23 ENCOUNTER — Encounter (HOSPITAL_COMMUNITY): Payer: Self-pay | Admitting: General Surgery

## 2017-11-23 ENCOUNTER — Inpatient Hospital Stay (HOSPITAL_COMMUNITY): Payer: Medicare FFS

## 2017-11-23 LAB — CBC WITH DIFFERENTIAL/PLATELET
BASOS ABS: 0 10*3/uL (ref 0.0–0.1)
BASOS PCT: 0 %
EOS ABS: 0.2 10*3/uL (ref 0.0–0.7)
EOS PCT: 2 %
HCT: 31.1 % — ABNORMAL LOW (ref 36.0–46.0)
HEMOGLOBIN: 10 g/dL — AB (ref 12.0–15.0)
Lymphocytes Relative: 10 %
Lymphs Abs: 1.3 10*3/uL (ref 0.7–4.0)
MCH: 29 pg (ref 26.0–34.0)
MCHC: 32.2 g/dL (ref 30.0–36.0)
MCV: 90.1 fL (ref 78.0–100.0)
Monocytes Absolute: 1.4 10*3/uL — ABNORMAL HIGH (ref 0.1–1.0)
Monocytes Relative: 10 %
NEUTROS PCT: 78 %
Neutro Abs: 10.7 10*3/uL — ABNORMAL HIGH (ref 1.7–7.7)
PLATELETS: 376 10*3/uL (ref 150–400)
RBC: 3.45 MIL/uL — AB (ref 3.87–5.11)
RDW: 17.2 % — ABNORMAL HIGH (ref 11.5–15.5)
WBC: 13.7 10*3/uL — ABNORMAL HIGH (ref 4.0–10.5)

## 2017-11-23 LAB — COMPREHENSIVE METABOLIC PANEL
ALT: 39 U/L (ref 0–44)
AST: 48 U/L — AB (ref 15–41)
Albumin: 2 g/dL — ABNORMAL LOW (ref 3.5–5.0)
Alkaline Phosphatase: 81 U/L (ref 38–126)
Anion gap: 8 (ref 5–15)
BILIRUBIN TOTAL: 0.9 mg/dL (ref 0.3–1.2)
BUN: 31 mg/dL — AB (ref 8–23)
CHLORIDE: 105 mmol/L (ref 98–111)
CO2: 26 mmol/L (ref 22–32)
CREATININE: 0.72 mg/dL (ref 0.44–1.00)
Calcium: 9 mg/dL (ref 8.9–10.3)
Glucose, Bld: 151 mg/dL — ABNORMAL HIGH (ref 70–99)
Potassium: 3.7 mmol/L (ref 3.5–5.1)
Sodium: 139 mmol/L (ref 135–145)
TOTAL PROTEIN: 6.3 g/dL — AB (ref 6.5–8.1)

## 2017-11-23 LAB — GLUCOSE, CAPILLARY
GLUCOSE-CAPILLARY: 140 mg/dL — AB (ref 70–99)
GLUCOSE-CAPILLARY: 151 mg/dL — AB (ref 70–99)
GLUCOSE-CAPILLARY: 153 mg/dL — AB (ref 70–99)
GLUCOSE-CAPILLARY: 163 mg/dL — AB (ref 70–99)

## 2017-11-23 LAB — PHOSPHORUS: Phosphorus: 3.1 mg/dL (ref 2.5–4.6)

## 2017-11-23 LAB — TRIGLYCERIDES: TRIGLYCERIDES: 129 mg/dL (ref <150)

## 2017-11-23 LAB — PREALBUMIN: PREALBUMIN: 9.8 mg/dL — AB (ref 18–38)

## 2017-11-23 LAB — MAGNESIUM: Magnesium: 2.2 mg/dL (ref 1.7–2.4)

## 2017-11-23 MED ORDER — INSULIN ASPART 100 UNIT/ML ~~LOC~~ SOLN
0.0000 [IU] | Freq: Four times a day (QID) | SUBCUTANEOUS | Status: DC
  Administered 2017-11-23: 3 [IU] via SUBCUTANEOUS
  Administered 2017-11-24: 2 [IU] via SUBCUTANEOUS
  Administered 2017-11-24 (×3): 3 [IU] via SUBCUTANEOUS
  Administered 2017-11-25: 2 [IU] via SUBCUTANEOUS
  Administered 2017-11-25: 3 [IU] via SUBCUTANEOUS
  Administered 2017-11-25: 2 [IU] via SUBCUTANEOUS

## 2017-11-23 MED ORDER — TRAVASOL 10 % IV SOLN
INTRAVENOUS | Status: AC
  Administered 2017-11-23: 18:00:00 via INTRAVENOUS
  Filled 2017-11-23: qty 820.8

## 2017-11-23 NOTE — Consult Note (Signed)
[facility] Nurse ostomy consult note Stoma type/location: LLQ colostomy Stomal assessment/size: 2" edematous pink and moist. Well budded. Os at center Peristomal assessment: intact  Patient with fragile, sensitive skin.  Noted erythema when pouch removed that resolved after cleansing.  Treatment options for stomal/peristomal skin: barrier ring Output soft brown stool Ostomy pouching: 2pc. 2 3/4" pouch with barrier ring  Education provided: Daughter at bedside.  Observed cleansing, cutting barrier to fit and applying new pouching system.  Discussed showering, emptying when 1/3 full.  Cleansing with mild soap and water.  Daughter states that patient lives with her.  Enrolled patient in [facility] program: No WOC team will follow.  Domenic Moras MSN, RN, FNP-BC CWON Wound, Ostomy, Continence Nurse Pager [phone]

## 2017-11-23 NOTE — Progress Notes (Signed)
[facility]/Trauma Progress Note  3 Days Post-Op   Assessment/Plan Principal Problem:   Acute diverticulitis Active Problems:   Constipation   Colouterine fistula   Protein-calorie malnutrition, severe   Goals of care, counseling/discussion   DNR (do not resuscitate) discussion   Palliative care encounter   COPD/ > 100 pack years/on Home O2 Abdominal aortic aneurysm Hypertension HxSVT- metoprolol  Hx FTT/deconditioning-Wheelchair to bed/some walking in room with walker Severemalnutrition - prealbumin 5.2/5.7 Chronic constipation  Recurrent diverticulitis with colo-uterine fistula- draining stool vaginally - s/p laparoscopic diverting end colostomy 9/27 by Dr Barry Dienes  FEN:TNA/reg diet+boost, calorie count ID: [id number] =>>day10 DVT: Lovneox Follow up: Dr. Barry Dienes 2 weeks Foley:none  Disposition: PT/OT; encourage PO, not taking enough PO to start to wean TPN; Dr Barry Dienes rec total of 2 weeks of abx for diverticulitis - doesn't have to be entirely IV. Calorie count and advanced diet to reg. Pt is doing well from a surgical standpoint.    LOS: 10 days    Subjective: CC: sleepy  No abdominal pain. Daughter at bedside. Pt tolerating FLD without nausea or vomiting. No issues overnight.   Objective: Vital signs in last 24 hours: Temp:  [98.2 F (36.8 C)-99 F (37.2 C)] 98.7 F (37.1 C) (09/30 0623) Pulse Rate:  [82-95] 82 (09/30 0623) Resp:  [16] 16 (09/30 0623) BP: (142-156)/(72-80) 149/72 (09/30 0623) SpO2:  [98 %-99 %] 99 % (09/29 2119) Weight:  [52.3 kg] 52.3 kg (09/29 1617) Last BM Date: 11/21/17  Intake/Output from previous day: 09/29 0701 - 09/30 0700 In: 1958.1 [P.O.:390; I.V.:1421.6; IV Piggyback:146.5] Out: 1925 [Urine:1850; Stool:75] Intake/Output this shift: Total I/O In: 240 [P.O.:240] Out: 0   PE: Gen:  Alert, NAD, pleasant, frail elderly woman Pulm:   Rate and effort normal Abd: Soft, NT/ND, +BS, incisions with glue  intact are well appearing. Copious gas in ostomy bag. Skin: warm and dry   Anti-infectives: Anti-infectives (From admission, onward)   Start     Dose/Rate Route Frequency Ordered Stop   11/20/17 0745  cefoTEtan (CEFOTAN) 2 g in sodium chloride 0.9 % 100 mL IVPB  Status:  Discontinued     2 g 200 mL/hr over 30 Minutes Intravenous On call to O.R. 11/20/17 4268 11/20/17 0741   11/20/17 0600  cefoTEtan (CEFOTAN) 2 g in sodium chloride 0.9 % 100 mL IVPB     2 g 200 mL/hr over 30 Minutes Intravenous On call to O.R. 11/19/17 0907 11/20/17 1119   11/14/17 0600  piperacillin-tazobactam (ZOSYN) IVPB 3.375 g     3.375 g 12.5 mL/hr over 240 Minutes Intravenous Every 8 hours 11/13/17 2225     11/13/17 2115  piperacillin-tazobactam (ZOSYN) IVPB 3.375 g     3.375 g 12.5 mL/hr over 240 Minutes Intravenous  Once 11/13/17 2101 11/14/17 0133      Lab Results:  Recent Labs    11/22/17 0413 11/23/17 0423  WBC 16.3* 13.7*  HGB 10.2* 10.0*  HCT 31.8* 31.1*  PLT 401* 376   BMET Recent Labs    11/22/17 0413 11/23/17 0423  NA 140 139  K 3.7 3.7  CL 105 105  CO2 25 26  GLUCOSE 132* 151*  BUN 31* 31*  CREATININE 0.79 0.72  CALCIUM 8.9 9.0   PT/INR No results for input(s): LABPROT, INR in the last 72 hours. CMP     Component Value Date/Time   NA 139 11/23/2017 0423   K 3.7 11/23/2017 0423   CL 105 11/23/2017 0423   CO2 26  11/23/2017 0423   GLUCOSE 151 (H) 11/23/2017 0423   BUN 31 (H) 11/23/2017 0423   CREATININE 0.72 11/23/2017 0423   CALCIUM 9.0 11/23/2017 0423   PROT 6.3 (L) 11/23/2017 0423   ALBUMIN 2.0 (L) 11/23/2017 0423   AST 48 (H) 11/23/2017 0423   ALT 39 11/23/2017 0423   ALKPHOS 81 11/23/2017 0423   BILITOT 0.9 11/23/2017 0423   GFRNONAA >60 11/23/2017 0423   GFRAA >60 11/23/2017 0423   Lipase     Component Value Date/Time   LIPASE 21 11/13/2017 1658    Studies/Results: Dg Chest 1 View  Result Date: 11/21/2017 CLINICAL DATA:  Tachypnea EXAM: CHEST  1 VIEW  COMPARISON:  11/18/2017, 11/05/2017, 07/13/2017 FINDINGS: Right upper extremity catheter tip over the SVC. Small left pleural effusion, increased compared to prior. Probable tiny right effusion. Worsening airspace disease at the left base. Stable cardiomediastinal silhouette with vascular congestion and mild interstitial edema. Aortic atherosclerosis. No pneumothorax. IMPRESSION: 1. Increasing left pleural effusion and left basilar disease. 2. Vascular congestion with increased edema. Suspect small right pleural effusion. Electronically Signed   By: Donavan Foil M.D.   On: 11/21/2017 19:44   Dg Chest Port 1 View  Result Date: 11/23/2017 CLINICAL DATA:  Shortness of Breath EXAM: PORTABLE CHEST 1 VIEW COMPARISON:  11/21/2017 FINDINGS: Right PICC line remains in place, unchanged. Small left pleural effusion with left basilar atelectasis or infiltrate. Slight improvement in aeration at the left base since prior study. No confluent opacity on the right. Heart is borderline in size. IMPRESSION: Continue small left effusion with slight improved left base atelectasis or infiltrate. Electronically Signed   By: Rolm Baptise M.D.   On: 11/23/2017 07:22      Kalman Drape , [facility] 11/23/2017, 10:26 AM  Pager: [phone] Mon-Wed, Friday 7:00am-4:30pm Thurs 7am-11:30am  Consults: [phone]

## 2017-11-23 NOTE — Progress Notes (Signed)
OT Cancellation Note  Patient Details Name: Melissa Riddle MRN: [id number] DOB: 1928-10-29   Cancelled Treatment:    Reason Eval/Treat Not Completed: Other (comment)  Pt back to bed, fatigued and having labs drawn.  Will check back on pt later this day or next day Kari Baars, [place] [phone] Office- [phone], Edwena Felty D 11/23/2017, 1:23 PM

## 2017-11-23 NOTE — Progress Notes (Signed)
Nutrition Note  48 hour calorie count ordered per surgery.  Diet: Regular +TPN @ 60 ml/hr Supplements: Boost Breeze po BID, each supplement provides 250 kcal and 9 grams of protein   Please document percent consumed for each item on the patient's meal tray ticket and keep in envelope. Also document percent of any supplement or snack pt consumes and keep documentation in envelope for RD to review.   Clayton Bibles, MS, RD, [place] Dietitian Pager: [phone] After Hours Pager: [phone]

## 2017-11-23 NOTE — Progress Notes (Signed)
PHARMACY - ADULT TOTAL PARENTERAL NUTRITION CONSULT NOTE   Pharmacy Consult for TPN Indication: severe malnutrition  Patient Measurements: Height: 5\' 4"  (162.6 cm) Weight: 115 lb 4.8 oz (52.3 kg) IBW/kg (Calculated) : 54.7 TPN AdjBW (KG): 56.2 Body mass index is 19.79 kg/m. Usual Weight: 50.4 kg  Insulin Requirements: 7 units in past 24 hours, moderate SSI scale  Current Nutrition: clear liquid diet - Boost 1 can bid  Current TPN formula:  IVF: none  Central access: PICC placed in 9/23  TPN start date:  9/23   ASSESSMENT                                                                                                          HPI: Patient is an 84 y.o F with hx recurrent diverticulitis with colo-uterine fistula presented to the ED on 11/13/17 with c/o abd pain.  Per [facility], no surgical intervention at this time. TPN started on 11/16/17 d/t severe malnutrition.  Significant events:  9/26 Dr. Verlon Au has asked about increasing the amount of protein in TPN. Spoke to [place] from [place] and is ok with increasing target protein to 80-85 grams/day  9/27 Colostomy, CL liq diet  Today:   Glucose ( goal <150): 140-165  Electrolytes: K 3.7, Mag 2.2, Phos 3.1; CorrCa slightly elevated at 10.6  Renal: scr ok   LFTs: AST slightly elevated at 48  TGs:  73 ( 9/24), 129 (9/30)  Prealbumin:  5.2 ( 9/24), 9.8 (9/30)   NUTRITIONAL GOALS                                                                                             RD recs: 9/26 Kcal:  1500-1700 kcal Protein:  75-90 grams Fluid:  >/= 1.7 L/day  Current TPN formula: provides 82 grams of protein, 1592 Kcal and 1440 ml per day.   PLAN                                                                                                At 1800 today:  Continue TPN at 60 ml/hr, goal rate .  TPN to contain standard multivitamins and trace elements.  Famotidine 20mg  daily added to TPN - lower dose due to renal function, may need to  incr to 40mg /day if renal fx  continues to improve  Change moderate SSI q6h  TPN lab panels on Mondays & Thursdays.  F/u daily.  Dia Sitter, PharmD, BCPS 11/23/2017 7:28 AM

## 2017-11-23 NOTE — Evaluation (Signed)
Physical Therapy Evaluation Patient Details Name: Melissa Riddle MRN: [id number] DOB: [birth year] Today's Date: 11/23/2017   History of Present Illness  84 yo female admitted with acute diverticulitis. S/P lap end colostomy 9/27. Hx of COPD-O2 dep, chronic diarrhea, AAA    Clinical Impression  On eval, pt required Max assist +2 for mobility. With time and encouragement from daughter, pt was able to bear weight and stand enough to pivot to recliner. Remained on [place] O2. Pt is very weak and has pain with movement. She is at risk for falls when mobilizing. Daughter was present during evaluation. Discussed d/c plan-daughter will take pt back home with her. They do not want SNF placement. Will follow and progress activity as tolerated. Will recommend pt resume HHPT once she returns home.     Follow Up Recommendations Home health PT;Supervision/Assistance - 24 hour(family does not want SNF)    Equipment Recommendations  (hospital bed if pt doesn't already have one)    Recommendations for Other Services       Precautions / Restrictions Precautions Precautions: Fall Precaution Comments: O2 dep; incontinent; L colostomy. VERY FRAGILE SKIN ARMS & LEGS  Restrictions Weight Bearing Restrictions: No      Mobility  Bed Mobility Overal bed mobility: Needs Assistance Bed Mobility: Rolling;Sidelying to Sit Rolling: Mod assist;+2 for physical assistance;+2 for safety/equipment Sidelying to sit: HOB elevated;Mod assist;+2 for physical assistance;+2 for safety/equipment       General bed mobility comments: Assist for bil LEs and trunk. Utilized bedpad to aid with positioning. Pt was able to assist, a little,  with moving legs and using R UE to pull on bedrail  Transfers Overall transfer level: Needs assistance   Transfers: Sit to/from Stand;Stand Pivot Transfers Sit to Stand: Max assist;+2 physical assistance;+2 safety/equipment;From elevated surface Stand pivot transfers: Mod assist;+2  physical assistance;+2 safety/equipment       General transfer comment: Stood x 2. Once with +1 assist-pt only able to get to partial standing. On 2nd attempt, had help with lifting bottom. Once standing, pt was able to pivot a few steps to get to recliner. Pt held on to therapist's arms/shoulders.   Ambulation/Gait             General Gait Details: NT-nonambulatory  Stairs            Wheelchair Mobility    Modified Rankin (Stroke Patients Only)       Balance Overall balance assessment: Needs assistance         Standing balance support: Bilateral upper extremity supported Standing balance-Leahy Scale: Poor                               Pertinent Vitals/Pain Pain Assessment: Faces Faces Pain Scale: Hurts even more Pain Location: back, surgical site Pain Descriptors / Indicators: Sharp;Discomfort;Sore;Operative site guarding;Grimacing Pain Intervention(s): Limited activity within patient's tolerance;Repositioned    Home Living Family/patient expects to be discharged to:: Private residence Living Arrangements: Children Available Help at Discharge: Family;Available 24 hours/day Type of Home: House Home Access: Ramped entrance     Home Layout: One level Home Equipment: Walker - 4 wheels;Bedside commode;Wheelchair - manual Additional Comments: home o2 at 3 L    Prior Function Level of Independence: Needs assistance   Gait / Transfers Assistance Needed: nonambulatory. Has been working with HHPT-able to stand and pivot with significant assistance           Hand Dominance  Extremity/Trunk Assessment   Upper Extremity Assessment Upper Extremity Assessment: Defer to OT evaluation    Lower Extremity Assessment Lower Extremity Assessment: Generalized weakness    Cervical / Trunk Assessment Cervical / Trunk Assessment: Kyphotic  Communication   Communication: HOH  Cognition Arousal/Alertness: Awake/alert Behavior During  Therapy: WFL for tasks assessed/performed                                   General Comments: follows 1 step commands well      General Comments      Exercises     Assessment/Plan    PT Assessment Patient needs continued PT services  PT Problem List Decreased strength;Decreased mobility;Decreased activity tolerance;Decreased range of motion;Decreased balance;Decreased knowledge of use of DME;Pain;Decreased skin integrity       PT Treatment Interventions Functional mobility training;Therapeutic activities;Balance training;Patient/family education;Therapeutic exercise;DME instruction    PT Goals (Current goals can be found in the Care Plan section)  Acute Rehab PT Goals Patient Stated Goal: home PT Goal Formulation: With family Time For Goal Achievement: 12/07/17 Potential to Achieve Goals: Fair    Frequency Min 2X/week   Barriers to discharge        Co-evaluation               AM-PAC PT "6 Clicks" Daily Activity  Outcome Measure Difficulty turning over in bed (including adjusting bedclothes, sheets and blankets)?: Unable Difficulty moving from lying on back to sitting on the side of the bed? : Unable Difficulty sitting down on and standing up from a chair with arms (e.g., wheelchair, bedside commode, etc,.)?: Unable Help needed moving to and from a bed to chair (including a wheelchair)?: Total Help needed walking in hospital room?: Total Help needed climbing 3-5 steps with a railing? : Total 6 Click Score: 6    End of Session   Activity Tolerance: Patient limited by pain;Patient limited by fatigue Patient left: in chair;with call bell/phone within reach;with family/visitor present   PT Visit Diagnosis: Muscle weakness (generalized) (M62.81);Other abnormalities of gait and mobility (R26.89)    Time: 6734-1937 PT Time Calculation (min) (ACUTE ONLY): 23 min   Charges:   PT Evaluation $PT Eval Moderate Complexity: 1 Mod PT  Treatments $Therapeutic Activity: 8-22 mins          Weston Anna, PT Acute Rehabilitation Services Pager: [phone] Office: [phone]

## 2017-11-23 NOTE — Progress Notes (Signed)
Patient ID: Melissa Riddle, female   DOB: 03/09/1928, 84 y.o.   MRN: [id number]  PROGRESS NOTE    Melissa Riddle  QIO:[id number] DOB: May 17, 1928 DOA: 11/13/2017 PCP: Orvis Brill, [facility]   Brief Narrative:  84 year old female with history of AAA, COPD, chronic constipation, recurrent diverticulitis with colo-uterine fistula was admitted on 11/13/2017 for abdominal pain and recurrent diverticulitis.  Patient was started on intravenous antibiotics.  General surgery was consulted.  Palliative care was also consulted.  Patient underwent laparoscopic diverting end colostomy on 11/20/2017 by general surgery.  Assessment & Plan:   Principal Problem:   Acute diverticulitis Active Problems:   Constipation   Colouterine fistula   Protein-calorie malnutrition, severe   Goals of care, counseling/discussion   DNR (do not resuscitate) discussion   Palliative care encounter   Recurrent diverticulitis with colo-uterine fistula -Status post laparoscopic diverting end colostomy on 11/20/2017 -General surgery following. -Continue Zosyn for now.  Today's day #10 of Zosyn.  General surgery recommends total 2 weeks of antibiotics.  Once oral intake starts getting better, might switch to oral Augmentin.  Leukocytosis -Probably secondary to above.  Improving.  Repeat a.m. labs  Dyspnea probably secondary to fluid overload and pleural effusion -Improving.  Continue Lasix 40 milligrams IV daily.  Strict output.  Daily weights.  Follow 2D echo.  Currently on oxygen via nasal cannula 2 L/min.  Wean off as able.  Chest x-ray from today still shows left-sided pleural effusion with slight improvement.  Severe malnutrition with hypoalbuminemia -Continue TPN.  Encourage the patient to have oral intake.  Monitor labs  Generalized deconditioning with failure to thrive -PT eval.  Palliative care following intermittently for goals of care.  Overall prognosis is guarded to poor.  Chronic anemia -Hemoglobin  stable.  Status post 2 units PRBC on 11/20/2017  Thrombocytosis -Questionable cause.  Resolved  DVT prophylaxis: Lovenox Code Status: Full Family Communication: Daughter at bedside Disposition Plan: Depends on clinical outcome  Consultants: General surgery/palliative care  Procedures: Diverting end colostomy on 11/20/2017  Antimicrobials: Zosyn from 11/13/2017 onwards   Subjective: Patient seen and examined at bedside.  She feels okay.  Does not complain of worsening abdominal pain.  Her appetite is improving.  Spoke to daughter at bedside.  No overnight fever or vomiting. Objective: Vitals:   11/22/17 1313 11/22/17 1617 11/22/17 2119 11/23/17 0623  BP: (!) 142/76  (!) 156/80 (!) 149/72  Pulse: 87  95 82  Resp: 16  16 16   Temp: 99 F (37.2 C)  98.2 F (36.8 C) 98.7 F (37.1 C)  TempSrc: Oral  Oral Oral  SpO2: 98%  99%   Weight:  52.3 kg    Height:        Intake/Output Summary (Last 24 hours) at 11/23/2017 1050 Last data filed at 11/23/2017 0934 Gross per 24 hour  Intake 2168.05 ml  Output 1250 ml  Net 918.05 ml   Filed Weights   11/13/17 2250 11/20/17 0913 11/22/17 1617  Weight: 50.4 kg 56.2 kg 52.3 kg    Examination:  General exam: No distress.  Very thinly built elderly female lying in bed Respiratory system: Bilateral decreased breath sounds at bases with  basilar crackles Cardiovascular system: Rate controlled, S1-S2 heard Gastrointestinal system: Abdomen is nondistended, soft and non tender;colostomy bag present.  Normal bowel sounds heard. Extremities: No cyanosis, edema   Data Reviewed: I have personally reviewed following labs and imaging studies  CBC: Recent Labs  Lab 11/17/17 3244 11/20/17 0321 11/21/17 0102 11/22/17 0413 11/23/17 0423  WBC 12.4* 10.6* 16.4* 16.3* 13.7*  NEUTROABS 9.0*  --  13.8* 12.7* 10.7*  HGB 8.4* 7.4* 10.1* 10.2* 10.0*  HCT 26.3* 23.8* 31.1* 31.8* 31.1*  MCV 88.9 90.5 89.1 89.8 90.1  PLT 559* 435* 383 401* 638   Basic  Metabolic Panel: Recent Labs  Lab 11/18/17 0408 11/19/17 0418 11/20/17 0321 11/21/17 0328 11/22/17 0413 11/23/17 0423  NA 137 137 137 138 140 139  K 4.1 4.4 4.2 4.1 3.7 3.7  CL 104 105 105 107 105 105  CO2 30 27 27 26 25 26   GLUCOSE 140* 139* 112* 206* 132* 151*  BUN 19 23 22  25* 31* 31*  CREATININE 0.67 0.75 0.68 0.67 0.79 0.72  CALCIUM 8.4* 8.9 8.7* 8.6* 8.9 9.0  MG 2.0 2.2 2.3 2.1  --  2.2  PHOS 1.9* 2.5 3.4 2.7  --  3.1   GFR: Estimated Creatinine Clearance: 40.9 mL/min (by C-G formula based on SCr of 0.72 mg/dL). Liver Function Tests: Recent Labs  Lab 11/17/17 0338 11/18/17 0408 11/19/17 0418 11/21/17 0328 11/22/17 0413 11/23/17 0423  AST 14*  --  21 34 71* 48*  ALT 7  --  10 20 43 39  ALKPHOS 50  --  39 42 79 81  BILITOT 0.5  --  0.2* 0.5 1.0 0.9  PROT 5.4*  --  5.5* 5.7* 6.3* 6.3*  ALBUMIN 1.7*  1.7* 1.7* 1.9* 1.8* 2.0* 2.0*   No results for input(s): LIPASE, AMYLASE in the last 168 hours. No results for input(s): AMMONIA in the last 168 hours. Coagulation Profile: Recent Labs  Lab 11/20/17 0321  INR 0.96   Cardiac Enzymes: No results for input(s): CKTOTAL, CKMB, CKMBINDEX, TROPONINI in the last 168 hours. BNP (last 3 results) No results for input(s): PROBNP in the last 8760 hours. HbA1C: No results for input(s): HGBA1C in the last 72 hours. CBG: Recent Labs  Lab 11/22/17 0537 11/22/17 1210 11/22/17 1616 11/22/17 2123 11/23/17 0749  GLUCAP 148* 165* 140* 141* 140*   Lipid Profile: Recent Labs    11/23/17 0423  TRIG 129   Thyroid Function Tests: No results for input(s): TSH, T4TOTAL, FREET4, T3FREE, THYROIDAB in the last 72 hours. Anemia Panel: No results for input(s): VITAMINB12, FOLATE, FERRITIN, TIBC, IRON, RETICCTPCT in the last 72 hours. Sepsis Labs: No results for input(s): PROCALCITON, LATICACIDVEN in the last 168 hours.  Recent Results (from the past 240 hour(s))  Surgical pcr screen     Status: Abnormal   Collection Time:  11/20/17  7:25 AM  Result Value Ref Range Status   MRSA, PCR POSITIVE (A) NEGATIVE Final    Comment: RESULT CALLED TO, READ BACK BY AND VERIFIED WITH: GRINDSTAFF,S @ 1220 ON [id number] BY POTEAT,S    Staphylococcus aureus POSITIVE (A) NEGATIVE Final    Comment: (NOTE) The Xpert SA Assay (FDA approved for NASAL specimens in patients 78 years of age and older), is one component of a comprehensive surveillance program. It is not intended to diagnose infection nor to guide or monitor treatment. Performed at [facility], [place] [address] [place], [place] [address]          Radiology Studies: Dg Chest 1 View  Result Date: 11/21/2017 CLINICAL DATA:  Tachypnea EXAM: CHEST  1 VIEW COMPARISON:  11/18/2017, 11/05/2017, 07/13/2017 FINDINGS: Right upper extremity catheter tip over the SVC. Small left pleural effusion, increased compared to prior. Probable tiny right effusion. Worsening airspace disease at the left base. Stable cardiomediastinal silhouette with vascular congestion and  mild interstitial edema. Aortic atherosclerosis. No pneumothorax. IMPRESSION: 1. Increasing left pleural effusion and left basilar disease. 2. Vascular congestion with increased edema. Suspect small right pleural effusion. Electronically Signed   By: Donavan Foil M.D.   On: 11/21/2017 19:44   Dg Chest Port 1 View  Result Date: 11/23/2017 CLINICAL DATA:  Shortness of Breath EXAM: PORTABLE CHEST 1 VIEW COMPARISON:  11/21/2017 FINDINGS: Right PICC line remains in place, unchanged. Small left pleural effusion with left basilar atelectasis or infiltrate. Slight improvement in aeration at the left base since prior study. No confluent opacity on the right. Heart is borderline in size. IMPRESSION: Continue small left effusion with slight improved left base atelectasis or infiltrate. Electronically Signed   By: Rolm Baptise M.D.   On: 11/23/2017 07:22        Scheduled Meds: . Chlorhexidine Gluconate Cloth   6 each Topical Q0600  . enoxaparin (LOVENOX) injection  30 mg Subcutaneous Q24H  . feeding supplement  1 Container Oral BID BM  . furosemide  40 mg Intravenous Daily  . insulin aspart  0-15 Units Subcutaneous TID WC  . insulin aspart  0-5 Units Subcutaneous QHS  . metoprolol tartrate  12.5 mg Oral BID  . mupirocin ointment  1 application Nasal BID  . polyethylene glycol  17 g Oral Daily  . pravastatin  40 mg Oral q1800   Continuous Infusions: . sodium chloride Stopped (11/20/17 2241)  . piperacillin-tazobactam (ZOSYN)  IV 3.375 g (11/23/17 1735)  . TPN ADULT (ION) 60 mL/hr at 11/23/17 0600  . TPN ADULT (ION)       LOS: 10 days        Aline August, MD [facility] Pager [phone]  If 7PM-7AM, please contact night-coverage [email or web address] Password [email or web address] 11/23/2017, 10:50 AM

## 2017-11-24 DIAGNOSIS — R630 Anorexia: Secondary | ICD-10-CM

## 2017-11-24 LAB — GLUCOSE, CAPILLARY
GLUCOSE-CAPILLARY: 150 mg/dL — AB (ref 70–99)
GLUCOSE-CAPILLARY: 167 mg/dL — AB (ref 70–99)
GLUCOSE-CAPILLARY: 169 mg/dL — AB (ref 70–99)
GLUCOSE-CAPILLARY: 186 mg/dL — AB (ref 70–99)
Glucose-Capillary: 151 mg/dL — ABNORMAL HIGH (ref 70–99)

## 2017-11-24 LAB — TYPE AND SCREEN
ABO/RH(D): B NEG
Antibody Screen: NEGATIVE
UNIT DIVISION: 0
UNIT DIVISION: 0
Unit division: 0
Unit division: 0
Unit division: 0
Unit division: 0

## 2017-11-24 LAB — BPAM RBC
BLOOD PRODUCT EXPIRATION DATE: 201910272359
Blood Product Expiration Date: 201910122359
Blood Product Expiration Date: 201910132359
Blood Product Expiration Date: 201910172359
Blood Product Expiration Date: 201910182359
Blood Product Expiration Date: 201910192359
ISSUE DATE / TIME: 201909271045
ISSUE DATE / TIME: 201909292347
ISSUE DATE / TIME: 201909271045
ISSUE DATE / TIME: 201909292139
UNIT TYPE AND RH: 1700
UNIT TYPE AND RH: 1700
UNIT TYPE AND RH: 1700
UNIT TYPE AND RH: 9500
Unit Type and Rh: 1700
Unit Type and Rh: 9500

## 2017-11-24 MED ORDER — PIPERACILLIN-TAZOBACTAM 3.375 G IVPB
3.3750 g | Freq: Three times a day (TID) | INTRAVENOUS | Status: DC
  Administered 2017-11-24 – 2017-11-27 (×9): 3.375 g via INTRAVENOUS
  Filled 2017-11-24 (×9): qty 50

## 2017-11-24 MED ORDER — TRAVASOL 10 % IV SOLN
INTRAVENOUS | Status: AC
  Administered 2017-11-24: 18:00:00 via INTRAVENOUS
  Filled 2017-11-24: qty 820.8

## 2017-11-24 MED ORDER — PRO-STAT SUGAR FREE PO LIQD
30.0000 mL | Freq: Two times a day (BID) | ORAL | Status: DC
  Administered 2017-11-24 – 2017-11-28 (×9): 30 mL via ORAL
  Filled 2017-11-24 (×8): qty 30

## 2017-11-24 MED ORDER — FUROSEMIDE 10 MG/ML IJ SOLN
20.0000 mg | Freq: Every day | INTRAMUSCULAR | Status: DC
  Administered 2017-11-25: 20 mg via INTRAVENOUS
  Filled 2017-11-24: qty 2

## 2017-11-24 MED ORDER — ENSURE ENLIVE PO LIQD
237.0000 mL | Freq: Three times a day (TID) | ORAL | Status: DC
  Administered 2017-11-24 – 2017-11-28 (×8): 237 mL via ORAL

## 2017-11-24 NOTE — Progress Notes (Addendum)
Patient ID: Melissa Riddle, female   DOB: Dec 22, 1928, 83 y.o.   MRN: [id number]  PROGRESS NOTE    Melissa Riddle  SWN:[id number] DOB: 06-29-1928 DOA: 11/13/2017 PCP: Orvis Brill, [facility]   Brief Narrative:  84 year old female with history of AAA, COPD, chronic constipation, recurrent diverticulitis with colo-uterine fistula was admitted on 11/13/2017 for abdominal pain and recurrent diverticulitis.  Patient was started on intravenous antibiotics.  General surgery was consulted.  Palliative care was also consulted.  Patient underwent laparoscopic diverting end colostomy on 11/20/2017 by general surgery.  Assessment & Plan:   Principal Problem:   Acute diverticulitis Active Problems:   Constipation   Colouterine fistula   Protein-calorie malnutrition, severe   Goals of care, counseling/discussion   DNR (do not resuscitate) discussion   Palliative care encounter   Recurrent diverticulitis with colo-uterine fistula -Status post laparoscopic diverting end colostomy on 11/20/2017 -General surgery following. -Continue Zosyn for now.  Today's day #11 of Zosyn.  General surgery recommends total 2 weeks of antibiotics.  Once oral intake starts getting better, might switch to oral Augmentin.  Leukocytosis -Probably secondary to above.  No labs today.  Repeat a.m. Labs  Dyspnea probably secondary to fluid overload and pleural effusion -Improving.  Decrease Lasix to 20 mg daily.  Strict output.  Daily weights.  2D echo shows EF of 60 to 65% with grade 1 diastolic dysfunction..  Currently on oxygen via nasal cannula 2 L/min.  Wean off as able.    Severe malnutrition with hypoalbuminemia -Continue TPN.  Encourage the patient to have oral intake.  Currently oral intake is poor.  Monitor labs  Generalized deconditioning with failure to thrive -PT eval.  Palliative care following intermittently for goals of care.  Overall prognosis is guarded to poor. -Patient looks very tired and ill today.   If condition does not improve, patient should consider hospice/comfort measures.  Patient is still full code for now.  Chronic anemia -Hemoglobin stable.  Status post 2 units PRBC on 11/20/2017  Thrombocytosis -Questionable cause.  Resolved  DVT prophylaxis: Lovenox Code Status: Full Family Communication: Daughter at bedside Disposition Plan: Depends on clinical outcome  Consultants: General surgery/palliative care  Procedures: Diverting end colostomy on 11/20/2017 Echo on 11/23/2017 Study Conclusions  - Left ventricle: The cavity size was normal. Systolic function was   normal. The estimated ejection fraction was in the range of 60%   to 65%. There was no dynamic obstruction. Wall motion was normal;   there were no regional wall motion abnormalities. Doppler   parameters are consistent with abnormal left ventricular   relaxation (grade 1 diastolic dysfunction). Doppler parameters   are consistent with elevated mean left atrial filling pressure. - Mitral valve: Calcified annulus. There was mild systolic anterior   motion of the anterior leaflet. There was mild regurgitation. - Left atrium: The atrium was mildly dilated. - Pulmonary arteries: Systolic pressure was mildly increased. PA   peak pressure: 38 mm Hg (S). Antimicrobials: Zosyn from 11/13/2017 onwards   Subjective: Patient seen and examined at bedside.  She does not feel well.  She feels very tired.  Her appetite has been very poor.  She complains of some abdominal and back pain.  No overnight vomiting.  Objective: Vitals:   11/22/17 2119 11/23/17 0623 11/23/17 1950 11/24/17 0508  BP: (!) 156/80 (!) 149/72 (!) 160/81 (!) 152/76  Pulse: 95 82 (!) 105 92  Resp: 16 16 20 18   Temp: 98.2 F (36.8 C) 98.7 F (37.1 C) 98.4 F (36.9  C) 98.3 F (36.8 C)  TempSrc: Oral Oral  Oral  SpO2: 99%  100% 99%  Weight:    49.4 kg  Height:        Intake/Output Summary (Last 24 hours) at 11/24/2017 1019 Last data filed at 11/24/2017  0900 Gross per 24 hour  Intake 1887.09 ml  Output 2970 ml  Net -1082.91 ml   Filed Weights   11/20/17 0913 11/22/17 1617 11/24/17 0508  Weight: 56.2 kg 52.3 kg 49.4 kg    Examination:  General exam: Looks very deconditioned.  Very thinly built elderly female lying in bed.  No acute distress Respiratory system: Bilateral decreased breath sounds at bases with  basilar crackles, no wheezing Cardiovascular system: Rate controlled, S1-S2 heard Gastrointestinal system: Abdomen is nondistended, soft and mildly tender around colostomy bag area ;colostomy bag present.  Normal bowel sounds heard. Extremities: No cyanosis, edema   Data Reviewed: I have personally reviewed following labs and imaging studies  CBC: Recent Labs  Lab 11/20/17 0321 11/21/17 0328 11/22/17 0413 11/23/17 0423  WBC 10.6* 16.4* 16.3* 13.7*  NEUTROABS  --  13.8* 12.7* 10.7*  HGB 7.4* 10.1* 10.2* 10.0*  HCT 23.8* 31.1* 31.8* 31.1*  MCV 90.5 89.1 89.8 90.1  PLT 435* 383 401* 161   Basic Metabolic Panel: Recent Labs  Lab 11/18/17 0408 11/19/17 0418 11/20/17 0321 11/21/17 0328 11/22/17 0413 11/23/17 0423  NA 137 137 137 138 140 139  K 4.1 4.4 4.2 4.1 3.7 3.7  CL 104 105 105 107 105 105  CO2 30 27 27 26 25 26   GLUCOSE 140* 139* 112* 206* 132* 151*  BUN 19 23 22  25* 31* 31*  CREATININE 0.67 0.75 0.68 0.67 0.79 0.72  CALCIUM 8.4* 8.9 8.7* 8.6* 8.9 9.0  MG 2.0 2.2 2.3 2.1  --  2.2  PHOS 1.9* 2.5 3.4 2.7  --  3.1   GFR: Estimated Creatinine Clearance: 38.6 mL/min (by C-G formula based on SCr of 0.72 mg/dL). Liver Function Tests: Recent Labs  Lab 11/18/17 0408 11/19/17 0418 11/21/17 0328 11/22/17 0413 11/23/17 0423  AST  --  21 34 71* 48*  ALT  --  10 20 43 39  ALKPHOS  --  39 42 79 81  BILITOT  --  0.2* 0.5 1.0 0.9  PROT  --  5.5* 5.7* 6.3* 6.3*  ALBUMIN 1.7* 1.9* 1.8* 2.0* 2.0*   No results for input(s): LIPASE, AMYLASE in the last 168 hours. No results for input(s): AMMONIA in the last 168  hours. Coagulation Profile: Recent Labs  Lab 11/20/17 0321  INR 0.96   Cardiac Enzymes: No results for input(s): CKTOTAL, CKMB, CKMBINDEX, TROPONINI in the last 168 hours. BNP (last 3 results) No results for input(s): PROBNP in the last 8760 hours. HbA1C: No results for input(s): HGBA1C in the last 72 hours. CBG: Recent Labs  Lab 11/23/17 1210 11/23/17 1620 11/23/17 1952 11/24/17 0037 11/24/17 0511  GLUCAP 151* 153* 163* 169* 150*   Lipid Profile: Recent Labs    11/23/17 0423  TRIG 129   Thyroid Function Tests: No results for input(s): TSH, T4TOTAL, FREET4, T3FREE, THYROIDAB in the last 72 hours. Anemia Panel: No results for input(s): VITAMINB12, FOLATE, FERRITIN, TIBC, IRON, RETICCTPCT in the last 72 hours. Sepsis Labs: No results for input(s): PROCALCITON, LATICACIDVEN in the last 168 hours.  Recent Results (from the past 240 hour(s))  Surgical pcr screen     Status: Abnormal   Collection Time: 11/20/17  7:25 AM  Result Value Ref  Range Status   MRSA, PCR POSITIVE (A) NEGATIVE Final    Comment: RESULT CALLED TO, READ BACK BY AND VERIFIED WITH: GRINDSTAFF,S @ 1220 ON [id number] BY POTEAT,S    Staphylococcus aureus POSITIVE (A) NEGATIVE Final    Comment: (NOTE) The Xpert SA Assay (FDA approved for NASAL specimens in patients 6 years of age and older), is one component of a comprehensive surveillance program. It is not intended to diagnose infection nor to guide or monitor treatment. Performed at [facility], [place] [address], [place], [place] [address]          Radiology Studies: Dg Chest Port 1 View  Result Date: 11/23/2017 CLINICAL DATA:  Shortness of Breath EXAM: PORTABLE CHEST 1 VIEW COMPARISON:  11/21/2017 FINDINGS: Right PICC line remains in place, unchanged. Small left pleural effusion with left basilar atelectasis or infiltrate. Slight improvement in aeration at the left base since prior study. No confluent opacity on the right.  Heart is borderline in size. IMPRESSION: Continue small left effusion with slight improved left base atelectasis or infiltrate. Electronically Signed   By: Rolm Baptise M.D.   On: 11/23/2017 07:22        Scheduled Meds: . Chlorhexidine Gluconate Cloth  6 each Topical Q0600  . enoxaparin (LOVENOX) injection  30 mg Subcutaneous Q24H  . feeding supplement  1 Container Oral BID BM  . furosemide  40 mg Intravenous Daily  . insulin aspart  0-15 Units Subcutaneous Q6H  . metoprolol tartrate  12.5 mg Oral BID  . mupirocin ointment  1 application Nasal BID  . polyethylene glycol  17 g Oral Daily  . pravastatin  40 mg Oral q1800   Continuous Infusions: . sodium chloride 500 mL (11/24/17 0921)  . TPN ADULT (ION) 60 mL/hr at 11/24/17 0826     LOS: 11 days        Aline August, MD [facility] Pager [phone]  If 7PM-7AM, please contact night-coverage [email or web address] Password [email or web address] 11/24/2017, 10:19 AM

## 2017-11-24 NOTE — Progress Notes (Signed)
Nutrition Follow-up  DOCUMENTATION CODES:   Severe malnutrition in context of chronic illness  INTERVENTION:    Ensure Enlive po TID, each supplement provides 350 kcal and 20 grams of protein  30 ml Prostat BID, each supplement provides 100 kcals and 15 grams protein.   Encourage PO intake  Wean TPN per pharmacy as PO progresses  NUTRITION DIAGNOSIS:   Severe Malnutrition related to chronic illness(recurrent diverticulitis now with a colo-uterine fistula) as evidenced by severe fat depletion, severe muscle depletion, energy intake < 75% for > 7 days.  Ongoing  GOAL:   Patient will meet greater than or equal to 90% of their needs  Not meeting PO, met with TPN  MONITOR:   Diet advancement, Labs, Weight trends, I & O's, Supplement acceptance, PO intake(TPN)  REASON FOR ASSESSMENT:   Consult New TPN/TNA  ASSESSMENT:    84 year old female with past medical history significant for COPD, abdominal aortic aneurysm, chronic constipation and diverticulitis that is recurrent.  Patient has been admitted with another episode of diverticulitis.    9/23- TPN started 9/27- diverting end colostomy   Calorie count started yesterday. Pt consumed bites of breakfast and sips of Ensure the entire day. TPN remains running @ 60 ml/hr to provide 82 grams of protein and 1592 kcal. Meets 100% of needs.   Discussed with daughter to encourage intake of oral nutrition supplements/meals so TPN can be weaned. Daughter seems to think pt's intake is poor due to her lethargy. Spoke with RN who reports pt received morphine this morning and this could be contributing. Encouraged nursing to provide supplements throughout the day to maximize calories and protein. Discussed option of TF with daughter briefly if intake does not progress in the near future.   RD to d/c Boost Breeze and order Ensure.   Weight noted to decrease from 124 lb on 9/27 to 109 lb today. Will continue to monitor trends.     Medications reviewed and include: 20 mg lasix once daily, miralax Labs reviewed.   Diet Order:   Diet Order            Diet regular Room service appropriate? Yes; Fluid consistency: Thin  Diet effective now              EDUCATION NEEDS:   Education needs have been addressed  Skin:  Skin Assessment: Reviewed RN Assessment  Last BM:  11/24/17- 1 piece colostomy  Height:   Ht Readings from Last 1 Encounters:  11/20/17 _0  (1.626 m)    Weight:   Wt Readings from Last 1 Encounters:  11/24/17 49.4 kg    Ideal Body Weight:  54.5 kg  BMI:  Body mass index is 18.71 kg/m.  Estimated Nutritional Needs:   Kcal:  1500-1700 kcal  Protein:  75-90 grams  Fluid:  >/= 1.7 L/day  Mariana Single RD, LDN Clinical Nutrition Pager # - [phone]

## 2017-11-24 NOTE — Care Management Important Message (Signed)
Important Message  Patient Details  Name: Melissa Riddle MRN: [id number] Date of Birth: [birth year]   Medicare Important Message Given:  Yes    Kerin Salen 11/24/2017, 12:17 [place] Message  Patient Details  Name: Melissa Riddle MRN: [id number] Date of Birth: 07-31-1928   Medicare Important Message Given:  Yes    Kerin Salen 11/24/2017, 12:17 PM

## 2017-11-24 NOTE — Evaluation (Signed)
Occupational Therapy Evaluation Patient Details Name: Melissa Riddle MRN: [id number] DOB: 1928/11/14 Today's Date: 11/24/2017    History of Present Illness 84 yo female admitted with acute diverticulitis. S/P lap end colostomy 9/27. Hx of COPD-O2 dep, chronic diarrhea, AAA   Clinical Impression   Pt admitted with the above. Pt currently with functional limitations due to the deficits listed below (see OT Problem List).  Pt will benefit from skilled OT to increase their safety and independence with ADL and functional mobility for ADL to facilitate discharge to venue listed below.      Follow Up Recommendations  Home health OT;Supervision/Assistance - 24 hour    Equipment Recommendations  None recommended by OT       Precautions / Restrictions Precautions Precautions: Fall Precaution Comments: O2 dep; incontinent; L colostomy. VERY FRAGILE SKIN ARMS & LEGS  Restrictions Weight Bearing Restrictions: No      Mobility Bed Mobility Overal bed mobility: Needs Assistance Bed Mobility: Rolling Rolling: Mod assist         General bed mobility comments: encouraged pt to reach for bed rail and A with bed mobility- pt did well with this  Transfers        NT                  ADL either performed or assessed with clinical judgement   ADL Overall ADL's : Needs assistance/impaired     Grooming: Wash/dry face;Oral care;Minimal assistance;Bed level                       Toileting- Clothing Manipulation and Hygiene: Total assistance;Bed level         General ADL Comments: encouraged pts caregivers to encourage her to perform grooming and self feeding for pt to use BUE to maintain mobility      Vision Patient Visual Report: No change from baseline              Pertinent Vitals/Pain Faces Pain Scale: Hurts little more Pain Location: general Pain Descriptors / Indicators: Discomfort;Sore Pain Intervention(s): Limited activity within patient's  tolerance;Monitored during session     Hand Dominance     Extremity/Trunk Assessment Upper Extremity Assessment Upper Extremity Assessment: Generalized weakness           Communication Communication Communication: HOH   Cognition Arousal/Alertness: Awake/alert Behavior During Therapy: WFL for tasks assessed/performed                                   General Comments: follows 1 step commands well   General Comments               Home Living Family/patient expects to be discharged to:: Private residence Living Arrangements: Children Available Help at Discharge: Family;Available 24 hours/day Type of Home: House Home Access: Ramped entrance     Home Layout: One level               Home Equipment: Walker - 4 wheels;Bedside commode;Wheelchair - manual   Additional Comments: home o2 at 3 L      Prior Functioning/Environment Level of Independence: Needs assistance  Gait / Transfers Assistance Needed: nonambulatory. Has been working with HHPT-able to stand and pivot with significant assistance              OT Problem List: Decreased strength;Decreased activity tolerance;Impaired balance (sitting and/or standing)      OT Treatment/Interventions: Self-care/ADL  training;Patient/family education;Therapeutic activities    OT Goals(Current goals can be found in the care plan section) Acute Rehab OT Goals Patient Stated Goal: home OT Goal Formulation: With patient Time For Goal Achievement: 12/08/17 ADL Goals Pt Will Perform Eating: with set-up;sitting Pt Will Perform Grooming: with set-up;sitting Pt Will Transfer to Toilet: with min assist;stand pivot transfer  OT Frequency: Min 2X/week   Barriers to Riddle/C:               AM-PAC PT "6 Clicks" Daily Activity     Outcome Measure Help from another person eating meals?: A Little Help from another person taking care of personal grooming?: A Little Help from another person toileting, which  includes using toliet, bedpan, or urinal?: Total Help from another person bathing (including washing, rinsing, drying)?: A Lot Help from another person to put on and taking off regular upper body clothing?: A Lot Help from another person to put on and taking off regular lower body clothing?: Total 6 Click Score: 12   End of Session Nurse Communication: Mobility status  Activity Tolerance: Patient limited by fatigue Patient left: in bed;with call bell/phone within reach  OT Visit Diagnosis: Muscle weakness (generalized) (M62.81);History of falling (Z91.81)                Time: 5797-2820 OT Time Calculation (min): 15 min Charges:  OT General Charges $OT Visit: 1 Visit OT Evaluation $OT Eval Moderate Complexity: 1 Mod  Kari Baars, OT Acute Rehabilitation Services [phone] Office- [phone]     Melissa Riddle, Melissa Riddle 11/24/2017, 3:19 PM

## 2017-11-24 NOTE — Progress Notes (Addendum)
PHARMACY - ADULT TOTAL PARENTERAL NUTRITION CONSULT NOTE   Pharmacy Consult for TPN Indication: severe malnutrition  Patient Measurements: Height: 5\' 4"  (162.6 cm) Weight: 109 lb (49.4 kg) IBW/kg (Calculated) : 54.7 TPN AdjBW (KG): 56.2 Body mass index is 18.71 kg/m. Usual Weight: 50.4 kg  Insulin Requirements: 10 units in past 24 hours, moderate SSI scale started 9/30  Current Nutrition: regular diet - Boost 1 can bid (pt refusing, last given on 9/29 AM)  Current TPN formula: Custom TPN at 60 mL/hr, goal rate  IVF: none  Central access: PICC placed in 9/23  TPN start date:  9/23   ASSESSMENT                                                                                                          HPI: Patient is an 84 y.o F with hx recurrent diverticulitis with colo-uterine fistula presented to the ED on 11/13/17 with c/o abd pain.  TPN started on 11/16/17 d/t severe malnutrition.  Significant events:  -9/26 Dr. Verlon Au has asked about increasing the amount of protein in TPN. Spoke to [place] from [place] and is ok with increasing target protein to 80-85 grams/day  -9/27 Colostomy  Today:   Glucose ( goal <150): 140-159  Electrolytes - no new labs today. Labs (9/30): K 3.7, Mag 2.2, Phos 3.1, Na 139; CorrCa slightly elevated at 10.6  K was slightly increased in TPN on 9/30  No calcium in TPN  Renal: Stable and WNL  LFTs (9/30): AST slightly elevated at 48, ALT WNL  TGs:  73 (9/24), 129 (9/30)  Prealbumin:  5.2 ( 9/24), 9.8 (9/30)   Per surgery note, patient does not have adequate PO intake to start weaning TPN. Pt attempting to eat some breakfast this morning when I was in room. Encouraged PO intake.  NUTRITIONAL GOALS                                                                                             RD recs: 9/26 Kcal:  1500-1700 kcal Protein:  75-90 grams Fluid:  >/= 1.7 L/day  Current TPN formula: provides 82 grams of protein, 1592 Kcal and 1440 ml per  day.   PLAN  At 1800 today:  Continue TPN at 60 ml/hr, goal rate. No changes to formula.  TPN to contain standard multivitamins and trace elements.  Famotidine 20 mg daily added to TPN - lower dose due to renal function, may need to incr to 40mg /day if renal fx continues to improve  Continue moderate SSI q6h  TPN lab panels on Mondays & Thursdays. Recheck electrolytes with AM labs tomorrow.  F/u daily.  Lenis Noon, PharmD, BCPS Clinical Pharmacist 11/24/17 7:33 AM

## 2017-11-24 NOTE — Progress Notes (Addendum)
Palliative:  [name] is sleeping but arousing when I address her. She moans and grimaces in pain. We discussed her poor nutrition and that she will not improve or get any better if she does not eat any better than she is currently. She says that she ate better today (RN reports that her daughter convinced her to eat some jello but this is all she has had today). She promptly feel back asleep while I was talking with her.   I believe she should be getting out of bed to chair for meals to try and help with wakefulness to try and increase intake. I missed her daughter who was reportedly at bedside most of the day. I will try and catch her tomorrow. I fear that Ms. Siverson's appetite will not improve much more as intake was extremely poor prior to surgery. I have explained this concern to family and patient prior to surgery. I will continue to try and talk with patient and family regarding overall poor prognosis and barriers to improvement.   Could also consider mirtazapine 7.5 mg qhs as appetite stimulant.   15 min  Vinie Sill, NP Palliative Medicine Team Pager # [phone] (M-F 8a-5p) Team Phone # [phone] (Nights/Weekends)

## 2017-11-24 NOTE — Progress Notes (Signed)
[facility]/Trauma Progress Note  4 Days Post-Op   Assessment/Plan Principal Problem:   Acute diverticulitis Active Problems:   Constipation   Colouterine fistula   Protein-calorie malnutrition, severe   Goals of care, counseling/discussion   DNR (do not resuscitate) discussion   Palliative care encounter   COPD/ > 100 pack years/on Home O2 Abdominal aortic aneurysm Hypertension HxSVT- metoprolol  Hx FTT/deconditioning-Wheelchair to bed/some walking in room with walker Severemalnutrition - prealbumin 5.2/5.7 Chronic constipation  Recurrent diverticulitis with colo-uterine fistula- draining stool vaginally - s/p laparoscopic diverting end colostomy 9/27 by Dr Barry Dienes  FEN:TNA/reg diet+boost, calorie count ID: [id number]>>   rec total 2 weeks of abx DVT: Lovneox Follow up: Dr. Barry Dienes 2 weeks Foley:none  Disposition: PT/OT; encourage PO, not taking enough PO to start to weaning TPN; Pt is doing well from a surgical standpoint.    LOS: 11 days    Subjective: CC: diverticulitis  Daughter at bedside. She states pt is not eating much.   Objective: Vital signs in last 24 hours: Temp:  [98.3 F (36.8 C)-98.4 F (36.9 C)] 98.3 F (36.8 C) (10/01 0508) Pulse Rate:  [92-105] 92 (10/01 0508) Resp:  [18-20] 18 (10/01 0508) BP: (152-160)/(76-81) 152/76 (10/01 0508) SpO2:  [99 %-100 %] 99 % (10/01 0508) Weight:  [49.4 kg] 49.4 kg (10/01 0508) Last BM Date: 11/23/17  Intake/Output from previous day: 09/30 0701 - 10/01 0700 In: 1920.7 [P.O.:260; I.V.:1435.1; IV Piggyback:225.6] Out: 2570 [Urine:2570] Intake/Output this shift: Total I/O In: 206.4 [P.O.:30; I.V.:146; IV Piggyback:30.4] Out: 400 [Urine:400]  PE: Gen:  Alert, NAD, pleasant, frail elderly woman Pulm:   Rate and effort normal Abd: Soft, ND, +BS, incisions with glue intact are well appearing. gas in ostomy bag. Stoma is pink and edematous. Mild generalized TTP. No signs of  peritonitis  Skin: warm and dry  Anti-infectives: Anti-infectives (From admission, onward)   Start     Dose/Rate Route Frequency Ordered Stop   11/20/17 0745  cefoTEtan (CEFOTAN) 2 g in sodium chloride 0.9 % 100 mL IVPB  Status:  Discontinued     2 g 200 mL/hr over 30 Minutes Intravenous On call to O.R. 11/20/17 9892 11/20/17 0741   11/20/17 0600  cefoTEtan (CEFOTAN) 2 g in sodium chloride 0.9 % 100 mL IVPB     2 g 200 mL/hr over 30 Minutes Intravenous On call to O.R. 11/19/17 0907 11/20/17 1119   11/14/17 0600  piperacillin-tazobactam (ZOSYN) IVPB 3.375 g  Status:  Discontinued     3.375 g 12.5 mL/hr over 240 Minutes Intravenous Every 8 hours 11/13/17 2225 11/24/17 0717   11/13/17 2115  piperacillin-tazobactam (ZOSYN) IVPB 3.375 g     3.375 g 12.5 mL/hr over 240 Minutes Intravenous  Once 11/13/17 2101 11/14/17 0133      Lab Results:  Recent Labs    11/22/17 0413 11/23/17 0423  WBC 16.3* 13.7*  HGB 10.2* 10.0*  HCT 31.8* 31.1*  PLT 401* 376   BMET Recent Labs    11/22/17 0413 11/23/17 0423  NA 140 139  K 3.7 3.7  CL 105 105  CO2 25 26  GLUCOSE 132* 151*  BUN 31* 31*  CREATININE 0.79 0.72  CALCIUM 8.9 9.0   PT/INR No results for input(s): LABPROT, INR in the last 72 hours. CMP     Component Value Date/Time   NA 139 11/23/2017 0423   K 3.7 11/23/2017 0423   CL 105 11/23/2017 0423   CO2 26 11/23/2017 0423   GLUCOSE 151 (H)  11/23/2017 0423   BUN 31 (H) 11/23/2017 0423   CREATININE 0.72 11/23/2017 0423   CALCIUM 9.0 11/23/2017 0423   PROT 6.3 (L) 11/23/2017 0423   ALBUMIN 2.0 (L) 11/23/2017 0423   AST 48 (H) 11/23/2017 0423   ALT 39 11/23/2017 0423   ALKPHOS 81 11/23/2017 0423   BILITOT 0.9 11/23/2017 0423   GFRNONAA >60 11/23/2017 0423   GFRAA >60 11/23/2017 0423   Lipase     Component Value Date/Time   LIPASE 21 11/13/2017 1658    Studies/Results: Dg Chest Port 1 View  Result Date: 11/23/2017 CLINICAL DATA:  Shortness of Breath EXAM: PORTABLE  CHEST 1 VIEW COMPARISON:  11/21/2017 FINDINGS: Right PICC line remains in place, unchanged. Small left pleural effusion with left basilar atelectasis or infiltrate. Slight improvement in aeration at the left base since prior study. No confluent opacity on the right. Heart is borderline in size. IMPRESSION: Continue small left effusion with slight improved left base atelectasis or infiltrate. Electronically Signed   By: Rolm Baptise M.D.   On: 11/23/2017 07:22      Kalman Drape , [facility] 11/24/2017, 10:21 AM  Pager: [phone] Mon-Wed, Friday 7:00am-4:30pm Thurs 7am-11:30am  Consults: [phone]

## 2017-11-25 DIAGNOSIS — Z9981 Dependence on supplemental oxygen: Secondary | ICD-10-CM

## 2017-11-25 DIAGNOSIS — R630 Anorexia: Secondary | ICD-10-CM

## 2017-11-25 DIAGNOSIS — J449 Chronic obstructive pulmonary disease, unspecified: Secondary | ICD-10-CM

## 2017-11-25 LAB — CBC WITH DIFFERENTIAL/PLATELET
BASOS ABS: 0 10*3/uL (ref 0.0–0.1)
BASOS PCT: 0 %
EOS ABS: 0.4 10*3/uL (ref 0.0–0.7)
EOS PCT: 3 %
HCT: 30.2 % — ABNORMAL LOW (ref 36.0–46.0)
HEMOGLOBIN: 9.5 g/dL — AB (ref 12.0–15.0)
Lymphocytes Relative: 11 %
Lymphs Abs: 1.5 10*3/uL (ref 0.7–4.0)
MCH: 28.6 pg (ref 26.0–34.0)
MCHC: 31.5 g/dL (ref 30.0–36.0)
MCV: 91 fL (ref 78.0–100.0)
Monocytes Absolute: 1.3 10*3/uL — ABNORMAL HIGH (ref 0.1–1.0)
Monocytes Relative: 10 %
NEUTROS PCT: 76 %
Neutro Abs: 10.4 10*3/uL — ABNORMAL HIGH (ref 1.7–7.7)
PLATELETS: 387 10*3/uL (ref 150–400)
RBC: 3.32 MIL/uL — AB (ref 3.87–5.11)
RDW: 16.8 % — AB (ref 11.5–15.5)
WBC: 13.7 10*3/uL — AB (ref 4.0–10.5)

## 2017-11-25 LAB — BASIC METABOLIC PANEL
ANION GAP: 8 (ref 5–15)
BUN: 46 mg/dL — ABNORMAL HIGH (ref 8–23)
CALCIUM: 9.6 mg/dL (ref 8.9–10.3)
CO2: 27 mmol/L (ref 22–32)
Chloride: 107 mmol/L (ref 98–111)
Creatinine, Ser: 0.75 mg/dL (ref 0.44–1.00)
GFR calc Af Amer: >60 mL/min (ref >60)
Glucose, Bld: 148 mg/dL — ABNORMAL HIGH (ref 70–99)
POTASSIUM: 3.6 mmol/L (ref 3.5–5.1)
Sodium: 142 mmol/L (ref 135–145)

## 2017-11-25 LAB — MAGNESIUM: MAGNESIUM: 2.4 mg/dL (ref 1.7–2.4)

## 2017-11-25 LAB — GLUCOSE, CAPILLARY
GLUCOSE-CAPILLARY: 130 mg/dL — AB (ref 70–99)
GLUCOSE-CAPILLARY: 141 mg/dL — AB (ref 70–99)
Glucose-Capillary: 159 mg/dL — ABNORMAL HIGH (ref 70–99)
Glucose-Capillary: 157 mg/dL — ABNORMAL HIGH (ref 70–99)

## 2017-11-25 LAB — PHOSPHORUS: Phosphorus: 4.1 mg/dL (ref 2.5–4.6)

## 2017-11-25 MED ORDER — INSULIN ASPART 100 UNIT/ML ~~LOC~~ SOLN: 0.0000 [IU] | Freq: Every day | SUBCUTANEOUS | Status: AC

## 2017-11-25 MED ORDER — TRAVASOL 10 % IV SOLN
INTRAVENOUS | Status: DC
  Administered 2017-11-25: 18:00:00 via INTRAVENOUS
  Filled 2017-11-25: qty 820.8

## 2017-11-25 MED ORDER — TRAVASOL 10 % IV SOLN: INTRAVENOUS | Status: DC

## 2017-11-25 MED ORDER — INSULIN ASPART 100 UNIT/ML ~~LOC~~ SOLN
0.0000 [IU] | Freq: Three times a day (TID) | SUBCUTANEOUS | Status: AC
  Administered 2017-11-26 (×2): 2 [IU] via SUBCUTANEOUS
  Administered 2017-11-26: 3 [IU] via SUBCUTANEOUS

## 2017-11-25 MED ORDER — POTASSIUM CHLORIDE 10 MEQ/50ML IV SOLN
10.0000 meq | INTRAVENOUS | Status: AC
  Administered 2017-11-25 (×2): 10 meq via INTRAVENOUS
  Filled 2017-11-25 (×2): qty 50

## 2017-11-25 MED ORDER — POLYETHYLENE GLYCOL 3350 17 G PO PACK
17.0000 g | PACK | Freq: Two times a day (BID) | ORAL | Status: DC
  Administered 2017-11-25 – 2017-11-28 (×6): 17 g via ORAL
  Filled 2017-11-25 (×6): qty 1

## 2017-11-25 MED ORDER — SENNA 8.6 MG PO TABS: 2.0000 | ORAL_TABLET | Freq: Every day | ORAL | Status: DC

## 2017-11-25 NOTE — Progress Notes (Signed)
[facility]/Trauma Progress Note  5 Days Post-Op   Assessment/Plan Principal Problem: Acute diverticulitis Active Problems: Constipation Colouterine fistula Protein-calorie malnutrition, severe Goals of care, counseling/discussion DNR (do not resuscitate) discussion Palliative care encounter  COPD/ > 100 pack years/on Home O2 Abdominal aortic aneurysm Hypertension HxSVT- metoprolol  Hx FTT/deconditioning-Wheelchair to bed/some walking in room with walker Severemalnutrition - prealbumin 5.2/5.7 Chronic constipation  Recurrent diverticulitis with colo-uterine fistula- draining stool vaginally -s/p laparoscopic diverting end colostomy 9/27 by Dr Barry Dienes  FEN:TNA/reg diet+boost, calorie count ID: [id number]>>   rec total 2 weeks of abx DVT: Lovneox Follow up: Dr. Barry Dienes 2 weeks Foley:none  Disposition: PT/OT; encourage PO, Pt is doing well from a surgical standpoint.    LOS: 12 days    Subjective: CC: abdominal pain  Pt states pain is improved from yesterday. She is not having nausea or vomiting. Daughter not at bedside.   Objective: Vital signs in last 24 hours: Temp:  [98 F (36.7 C)-99 F (37.2 C)] 98 F (36.7 C) (10/02 0513) Pulse Rate:  [88-106] 88 (10/02 0513) Resp:  [14-20] 15 (10/02 0513) BP: (117-141)/(63-75) 141/75 (10/02 0513) SpO2:  [99 %-100 %] 100 % (10/02 0513) Weight:  [51 kg] 51 kg (10/02 0513) Last BM Date: 11/23/17  Intake/Output from previous day: 10/01 0701 - 10/02 0700 In: 1488.8 [P.O.:285; I.V.:1073.5; IV Piggyback:130.3] Out: 800 [Urine:800] Intake/Output this shift: Total I/O In: -  Out: 150 [Urine:150]  PE: Gen: Alert, NAD, pleasant, frail elderly woman Pulm:Rate andeffort normal Abd: Soft, ND, +BS, incisionswith glue intact are well appearing. gas in ostomy bag but no stool. Stoma is pink and edematous. Mild generalized TTP of right hemiabdomen no guarding. No signs of  peritonitis  Skin: warm and dry   Anti-infectives: Anti-infectives (From admission, onward)   Start     Dose/Rate Route Frequency Ordered Stop   11/24/17 1600  piperacillin-tazobactam (ZOSYN) IVPB 3.375 g     3.375 g 12.5 mL/hr over 240 Minutes Intravenous Every 8 hours 11/24/17 1023     11/20/17 0745  cefoTEtan (CEFOTAN) 2 g in sodium chloride 0.9 % 100 mL IVPB  Status:  Discontinued     2 g 200 mL/hr over 30 Minutes Intravenous On call to O.R. 11/20/17 4196 11/20/17 0741   11/20/17 0600  cefoTEtan (CEFOTAN) 2 g in sodium chloride 0.9 % 100 mL IVPB     2 g 200 mL/hr over 30 Minutes Intravenous On call to O.R. 11/19/17 0907 11/20/17 1119   11/14/17 0600  piperacillin-tazobactam (ZOSYN) IVPB 3.375 g  Status:  Discontinued     3.375 g 12.5 mL/hr over 240 Minutes Intravenous Every 8 hours 11/13/17 2225 11/24/17 0717   11/13/17 2115  piperacillin-tazobactam (ZOSYN) IVPB 3.375 g     3.375 g 12.5 mL/hr over 240 Minutes Intravenous  Once 11/13/17 2101 11/14/17 0133      Lab Results:  Recent Labs    11/23/17 0423 11/25/17 0416  WBC 13.7* 13.7*  HGB 10.0* 9.5*  HCT 31.1* 30.2*  PLT 376 387   BMET Recent Labs    11/23/17 0423 11/25/17 0416  NA 139 142  K 3.7 3.6  CL 105 107  CO2 26 27  GLUCOSE 151* 148*  BUN 31* 46*  CREATININE 0.72 0.75  CALCIUM 9.0 9.6   PT/INR No results for input(s): LABPROT, INR in the last 72 hours. CMP     Component Value Date/Time   NA 142 11/25/2017 0416   K 3.6 11/25/2017 0416   CL 107  11/25/2017 0416   CO2 27 11/25/2017 0416   GLUCOSE 148 (H) 11/25/2017 0416   BUN 46 (H) 11/25/2017 0416   CREATININE 0.75 11/25/2017 0416   CALCIUM 9.6 11/25/2017 0416   PROT 6.3 (L) 11/23/2017 0423   ALBUMIN 2.0 (L) 11/23/2017 0423   AST 48 (H) 11/23/2017 0423   ALT 39 11/23/2017 0423   ALKPHOS 81 11/23/2017 0423   BILITOT 0.9 11/23/2017 0423   GFRNONAA >60 11/25/2017 0416   GFRAA >60 11/25/2017 0416   Lipase     Component Value Date/Time    LIPASE 21 11/13/2017 1658    Studies/Results: No results found.    Kalman Drape , [facility] 11/25/2017, 10:53 AM  Pager: [phone] Mon-Wed, Friday 7:00am-4:30pm Thurs 7am-11:30am  Consults: [phone]

## 2017-11-25 NOTE — Progress Notes (Signed)
PT Cancellation Note  Patient Details Name: Melissa Riddle MRN: [id number] DOB: [birth year]   Cancelled Treatment:    Reason Eval/Treat Not Completed: Attempted PT tx session. Daughter present-she declined PT today for pt. She stated pt has not been eating and she is very tired. Will check back another day at daughter's request.   Weston Anna, PT Acute Rehabilitation Services Pager: [phone] Office: [phone]

## 2017-11-25 NOTE — Progress Notes (Signed)
Occupational Therapy Treatment Patient Details Name: Melissa Riddle MRN: [id number] DOB: 04-Jun-1928 Today's Date: 11/25/2017    History of present illness 84 yo female admitted with acute diverticulitis. S/P lap end colostomy 9/27. Hx of COPD-O2 dep, chronic diarrhea, AAA   OT comments  Pt agreed to grooming as well as BUE AAROM. Pt very pleasant. Daughter not home. Pt smiled and said she was making maccaroni salad as she thought that sounded good   Follow Up Recommendations  Home health OT;Supervision/Assistance - 24 hour    Equipment Recommendations  None recommended by OT    Recommendations for Other Services      Precautions / Restrictions Precautions Precautions: Fall Precaution Comments: O2 dep; incontinent; L colostomy. VERY FRAGILE SKIN ARMS & LEGS  Restrictions Weight Bearing Restrictions: No              ADL either performed or assessed with clinical judgement   ADL Overall ADL's : Needs assistance/impaired     Grooming: Wash/dry face;Minimal assistance;Bed level                       Toileting- Clothing Manipulation and Hygiene: Total assistance;Bed level         General ADL Comments: Pt agreed to BUE  GENTLE AAROM.  OT supported pt at her hand and A ed with gentle shoulder flexion, elbow flexion/extension and hand ROM.  Pt did well this with .  BUE supported on pillows after finishing BUE AAROM               Cognition Arousal/Alertness: Awake/alert Behavior During Therapy: WFL for tasks assessed/performed Overall Cognitive Status: No family/caregiver present to determine baseline cognitive functioning                                 General Comments: follows 1 step commands well                   Pertinent Vitals/ Pain       Pain Score: 3  Pain Location: stomach area Pain Descriptors / Indicators: Discomfort         Frequency  Min 2X/week        Progress Toward Goals  OT Goals(current goals can now  be found in the care plan section)  Progress towards OT goals: OT to reassess next treatment     Plan Discharge plan remains appropriate       AM-PAC PT "6 Clicks" Daily Activity     Outcome Measure   Help from another person eating meals?: A Little Help from another person taking care of personal grooming?: A Little Help from another person toileting, which includes using toliet, bedpan, or urinal?: Total Help from another person bathing (including washing, rinsing, drying)?: A Lot Help from another person to put on and taking off regular upper body clothing?: A Lot Help from another person to put on and taking off regular lower body clothing?: Total 6 Click Score: 12    End of Session    OT Visit Diagnosis: Muscle weakness (generalized) (M62.81);History of falling (Z91.81)   Activity Tolerance Patient limited by fatigue   Patient Left in bed;with call bell/phone within reach   Nurse Communication Mobility status        Time: 0539-7673 OT Time Calculation (min): 17 min  Charges: OT General Charges $OT Visit: 1 Visit OT Treatments $Therapeutic Activity: 8-22 mins  Kari Baars, OT  Acute Rehabilitation Services [phone] Office- [phone], Thereasa Parkin 11/25/2017, 12:45 PM

## 2017-11-25 NOTE — Progress Notes (Signed)
PROGRESS NOTE    Melissa Riddle   RSW:[id number]  DOB: [birth year]  DOA: 11/13/2017 PCP: Orvis Brill, [facility]   Brief Narrative:  Melissa Riddle 84 year old female with history of AAA, COPD, chronic constipation, recurrent diverticulitis with colo-uterine fistula was admitted on 11/13/2017 for abdominal pain and recurrent diverticulitis.  Patient was started on intravenous antibiotics.  General surgery was consulted.  Palliative care was also consulted.  Patient underwent laparoscopic diverting end colostomy on 11/20/2017 by general surgery.   Subjective: No complaints.   Assessment & Plan:   Principal Problem:   Acute diverticulitis / Colouterine fistula - -s/p laparoscopic diverting end colostomy 9/27 by Dr Barry Dienes - the patient is receiving education regarding the care of the colostomy - cont Zosyn for total 14 days per general surgery - leukocytosis improving  Active Problems:   Constipation- chronic issue - increase Miralax to BID as her stool is still quite hard     Protein-calorie malnutrition, severe - cont ensure and Prostat- oral intake remains poor  COPD with chronic respiratory failure - on 2.5 L O2 at baseline  HTN/ h/o SVT - cont Lopressor- ECHO noted below shows Gr 1 d CHF and normal systolic funtion  HLD - on Pravastatin  Abdominal aortic aneurysms -Patient noted to have to falciform infrarenal abdominal aortic aneurysms measuring up to 4.5 cm on CTA abdomen pelvis -Repeat CTA in 6 months and vascular consultation  Mostly wheelchair and bed bound   DVT prophylaxis: Lovenox Code Status: Full code Family Communication:  Disposition Plan: follow oral intake Consultants:   Gen surgery Procedures:    laparoscopic diverting end colostomy 9/27   2 D ECHO - Left ventricle: The cavity size was normal. Systolic function was   normal. The estimated ejection fraction was in the range of 60%   to 65%. There was no dynamic obstruction. Wall motion  was normal;   there were no regional wall motion abnormalities. Doppler   parameters are consistent with abnormal left ventricular   relaxation (grade 1 diastolic dysfunction). Doppler parameters   are consistent with elevated mean left atrial filling pressure. - Mitral valve: Calcified annulus. There was mild systolic anterior   motion of the anterior leaflet. There was mild regurgitation. - Left atrium: The atrium was mildly dilated. - Pulmonary arteries: Systolic pressure was mildly increased. PA   peak pressure: 38 mm Hg (S).   Antimicrobials:  Anti-infectives (From admission, onward)   Start     Dose/Rate Route Frequency Ordered Stop   11/24/17 1600  piperacillin-tazobactam (ZOSYN) IVPB 3.375 g     3.375 g 12.5 mL/hr over 240 Minutes Intravenous Every 8 hours 11/24/17 1023     11/20/17 0745  cefoTEtan (CEFOTAN) 2 g in sodium chloride 0.9 % 100 mL IVPB  Status:  Discontinued     2 g 200 mL/hr over 30 Minutes Intravenous On call to O.R. 11/20/17 0938 11/20/17 0741   11/20/17 0600  cefoTEtan (CEFOTAN) 2 g in sodium chloride 0.9 % 100 mL IVPB     2 g 200 mL/hr over 30 Minutes Intravenous On call to O.R. 11/19/17 0907 11/20/17 1119   11/14/17 0600  piperacillin-tazobactam (ZOSYN) IVPB 3.375 g  Status:  Discontinued     3.375 g 12.5 mL/hr over 240 Minutes Intravenous Every 8 hours 11/13/17 2225 11/24/17 0717   11/13/17 2115  piperacillin-tazobactam (ZOSYN) IVPB 3.375 g     3.375 g 12.5 mL/hr over 240 Minutes Intravenous  Once 11/13/17 2101 11/14/17 0133  Objective: Vitals:   11/25/17 0513 11/25/17 1107 11/25/17 1220 11/25/17 1310  BP: (!) 141/75 (!) 172/81  125/81  Pulse: 88 (!) 108  (!) 108  Resp: 15   17  Temp: 98 F (36.7 C)   98.4 F (36.9 C)  TempSrc: Oral   Oral  SpO2: 100%  94% 100%  Weight: 51 kg     Height:        Intake/Output Summary (Last 24 hours) at 11/25/2017 1644 Last data filed at 11/25/2017 1500 Gross per 24 hour  Intake 1735.31 ml  Output  1000 ml  Net 735.31 ml   Filed Weights   11/22/17 1617 11/24/17 0508 11/25/17 0513  Weight: 52.3 kg 49.4 kg 51 kg    Examination: General exam: Appears comfortable  HEENT: PERRLA, oral mucosa moist, no sclera icterus or thrush Respiratory system: Clear to auscultation. Respiratory effort normal. Cardiovascular system: S1 & S2 heard, RRR.   Gastrointestinal system: Abdomen soft, non-tender, nondistended. Normal bowel sound- colostomy noted Central nervous system: Alert and oriented. No focal neurological deficits. Extremities: No cyanosis, clubbing or edema Skin: No rashes or ulcers Psychiatry:  Mood & affect appropriate.     Data Reviewed: I have personally reviewed following labs and imaging studies  CBC: Recent Labs  Lab 11/20/17 0321 11/21/17 0328 11/22/17 0413 11/23/17 0423 11/25/17 0416  WBC 10.6* 16.4* 16.3* 13.7* 13.7*  NEUTROABS  --  13.8* 12.7* 10.7* 10.4*  HGB 7.4* 10.1* 10.2* 10.0* 9.5*  HCT 23.8* 31.1* 31.8* 31.1* 30.2*  MCV 90.5 89.1 89.8 90.1 91.0  PLT 435* 383 401* 376 660   Basic Metabolic Panel: Recent Labs  Lab 11/19/17 0418 11/20/17 0321 11/21/17 0328 11/22/17 0413 11/23/17 0423 11/25/17 0416  NA 137 137 138 140 139 142  K 4.4 4.2 4.1 3.7 3.7 3.6  CL 105 105 107 105 105 107  CO2 27 27 26 25 26 27   GLUCOSE 139* 112* 206* 132* 151* 148*  BUN 23 22 25* 31* 31* 46*  CREATININE 0.75 0.68 0.67 0.79 0.72 0.75  CALCIUM 8.9 8.7* 8.6* 8.9 9.0 9.6  MG 2.2 2.3 2.1  --  2.2 2.4  PHOS 2.5 3.4 2.7  --  3.1 4.1   GFR: Estimated Creatinine Clearance: 39.9 mL/min (by C-G formula based on SCr of 0.75 mg/dL). Liver Function Tests: Recent Labs  Lab 11/19/17 0418 11/21/17 0328 11/22/17 0413 11/23/17 0423  AST 21 34 71* 48*  ALT 10 20 43 39  ALKPHOS 39 42 79 81  BILITOT 0.2* 0.5 1.0 0.9  PROT 5.5* 5.7* 6.3* 6.3*  ALBUMIN 1.9* 1.8* 2.0* 2.0*   No results for input(s): LIPASE, AMYLASE in the last 168 hours. No results for input(s): AMMONIA in the  last 168 hours. Coagulation Profile: Recent Labs  Lab 11/20/17 0321  INR 0.96   Cardiac Enzymes: No results for input(s): CKTOTAL, CKMB, CKMBINDEX, TROPONINI in the last 168 hours. BNP (last 3 results) No results for input(s): PROBNP in the last 8760 hours. HbA1C: No results for input(s): HGBA1C in the last 72 hours. CBG: Recent Labs  Lab 11/24/17 1145 11/24/17 1757 11/24/17 2357 11/25/17 0514 11/25/17 1139  GLUCAP 167* 186* 151* 130* 141*   Lipid Profile: Recent Labs    11/23/17 0423  TRIG 129   Thyroid Function Tests: No results for input(s): TSH, T4TOTAL, FREET4, T3FREE, THYROIDAB in the last 72 hours. Anemia Panel: No results for input(s): VITAMINB12, FOLATE, FERRITIN, TIBC, IRON, RETICCTPCT in the last 72 hours. Urine analysis:  Component Value Date/Time   COLORURINE YELLOW 11/13/2017 2047   APPEARANCEUR HAZY (A) 11/13/2017 2047   LABSPEC 1.006 11/13/2017 2047   PHURINE 8.0 11/13/2017 2047   GLUCOSEU NEGATIVE 11/13/2017 2047   HGBUR NEGATIVE 11/13/2017 2047   BILIRUBINUR NEGATIVE 11/13/2017 2047   KETONESUR NEGATIVE 11/13/2017 2047   PROTEINUR NEGATIVE 11/13/2017 2047   NITRITE NEGATIVE 11/13/2017 2047   LEUKOCYTESUR NEGATIVE 11/13/2017 2047   Sepsis Labs: @LABRCNTIP (procalcitonin:4,lacticidven:4) ) Recent Results (from the past 240 hour(s))  Surgical pcr screen     Status: Abnormal   Collection Time: 11/20/17  7:25 AM  Result Value Ref Range Status   MRSA, PCR POSITIVE (A) NEGATIVE Final    Comment: RESULT CALLED TO, READ BACK BY AND VERIFIED WITH: GRINDSTAFF,S @ 1220 ON [id number] BY POTEAT,S    Staphylococcus aureus POSITIVE (A) NEGATIVE Final    Comment: (NOTE) The Xpert SA Assay (FDA approved for NASAL specimens in patients 57 years of age and older), is one component of a comprehensive surveillance program. It is not intended to diagnose infection nor to guide or monitor treatment. Performed at [facility], [place] [address], [place], [place] [address]          Radiology Studies: No results found.    Scheduled Meds: . Chlorhexidine Gluconate Cloth  6 each Topical Q0600  . enoxaparin (LOVENOX) injection  30 mg Subcutaneous Q24H  . feeding supplement (ENSURE ENLIVE)  237 mL Oral TID BM  . feeding supplement (PRO-STAT SUGAR FREE 64)  30 mL Oral BID  . furosemide  20 mg Intravenous Daily  . insulin aspart  0-15 Units Subcutaneous Q6H  . metoprolol tartrate  12.5 mg Oral BID  . polyethylene glycol  17 g Oral Daily  . pravastatin  40 mg Oral q1800   Continuous Infusions: . sodium chloride 10 mL/hr at 11/24/17 1800  . piperacillin-tazobactam (ZOSYN)  IV 3.375 g (11/25/17 0832)  . TPN ADULT (ION) 60 mL/hr at 11/24/17 1741  . TPN ADULT (ION)       LOS: 12 days    Time spent in minutes: 35    Debbe Odea, MD [facility] Pager: [email or web address] Password [email or web address] 11/25/2017, 4:44 PM

## 2017-11-25 NOTE — Progress Notes (Signed)
Palliative:  Ms. Ketchum is again sleeping today. She did not arouse when I came in the room today. She is sleeping all the time. I did call and reach out to her daughter, Arbie Cookey. I attempted to try and find out when I could meet with Arbie Cookey tomorrow to discuss with her about her mother's progress and my concerns. Arbie Cookey would not commit to a time she would be at the hospital tomorrow. Arbie Cookey asked about my concerns and I explained that I worry about her mother's ability to progress at all given her lack of intake and sleeping all the time. Arbie Cookey admits, briefly, that she is unsure if her mother will be able to bounce back this time. However, this was immediately followed by explaining that her mother has always done poorly after surgery and other events in the past but has always bounced back with time. She then begins to talk of how wonderful the nurses and NTs have been but then explains that she feels they need to make sure they are waking her mother up frequently and encouraging her to drink Ensure and eat her meals. I did explain that when we have to wake patients up and then have to convince them just to drink a little Ensure that this is not a good sign that she will do well. Arbie Cookey repeatedly voiced her concerns to me via telephone. I offered trial of mirtazapine and Arbie Cookey says that her mother did poorly with this in the past so we will not add. I reassured Arbie Cookey that I would speak with staff to ensure we are helping to give her mother the best chance of any recovery.  I will continue to follow but will not see daily. I will shadow chart and follow up with family periodically.   Exam: Sleeping. No distress. Did not awaken.   51 min   Vinie Sill, NP Palliative Medicine Team Pager # [phone] (M-F 8a-5p) Team Phone # [phone] (Nights/Weekends)

## 2017-11-25 NOTE — Progress Notes (Signed)
PHARMACY - ADULT TOTAL PARENTERAL NUTRITION CONSULT NOTE   Pharmacy Consult for TPN Indication: severe malnutrition  Patient Measurements: Height: 5\' 4"  (162.6 cm) Weight: 112 lb 7 oz (51 kg) IBW/kg (Calculated) : 54.7 TPN AdjBW (KG): 56.2 Body mass index is 19.3 kg/m. Usual Weight: 50.4 kg  Insulin Requirements: 11 units in past 24 hours, on moderate SSI scale  Current Nutrition: regular diet -Ensure TID between meals, pro-stat PO BID (given 10/1) -Regular diet: charted that pt consumed ~60% of meals yesterday but still very poor PO intake  Current TPN formula: Custom TPN at 60 mL/hr, goal rate  IVF: none  Central access: PICC placed in 9/23  TPN start date:  9/23   ASSESSMENT                                                                                                          HPI: Patient is an 84 y.o F with hx recurrent diverticulitis with colo-uterine fistula presented to the ED on 11/13/17 with c/o abd pain.  TPN started on 11/16/17 d/t severe malnutrition.  Significant events:  -9/26 Dr. Verlon Au has asked about increasing the amount of protein in TPN. Spoke to [place] from RD and is ok with increasing target protein to 80-85 grams/day  -9/27 Colostomy -10/2 IV furosemide decreased to 20 mg IV daily  Today:   Glucose (goal <150): 130-186  Electrolytes -  K 3.6, Mag 2.4, Phos 4.1, Na 142 WNL; CorrCa elevated at 11.2 (Ca-Phos product ~45: goal is < 55)  K was slightly increased in TPN on 9/30  No calcium in TPN  Phos and Mg upper end of normal  Renal: Stable and WNL  LFTs (9/30): AST slightly elevated at 48, ALT WNL  TGs:  73 (9/24), 129 (9/30)  Prealbumin:  5.2 (9/24), 9.8 (9/30)   Patient has BM charted 9/30, but still minimal PO intake. Concerned that appetite may not improve despite encouraged PO intake. Per MD, continue TPN at this time.  NUTRITIONAL GOALS                                                                                             RD  recs: 9/26 Kcal:  1500-1700 kcal Protein:  75-90 grams Fluid:  >/= 1.7 L/day  Current TPN formula: provides 82 grams of protein, 1592 Kcal and 1440 ml per day.   PLAN             Now:  -KCl 10 mEq IV x 2 runs  At 1800 today:  Continue TPN at 60 ml/hr, goal rate.  Electrolyte modifications to TPN:   Decrease phosphorus to 15 mmol/L standard concentration  Slightly decrease magnesium to 4 mEq/L  TPN to contain standard multivitamins and trace elements.  Famotidine 20 mg daily added to TPN - lower dose due to renal function  Continue moderate SSI q6h  TPN lab panels on Mondays & Thursdays.   F/u daily.  Lenis Noon, PharmD, BCPS Clinical Pharmacist 11/25/17 7:20 AM

## 2017-11-25 NOTE — Progress Notes (Signed)
Nutrition Follow-up  DOCUMENTATION CODES:   Severe malnutrition in context of chronic illness  INTERVENTION:   TPN per Pharmacy  -Continue Ensure Enlive po TID, each supplement provides 350 kcal and 20 grams of protein -Continue Prostat liquid protein PO 30 ml BID with meals, each supplement provides 100 kcal, 15 grams protein. -D/c Calorie Count  NUTRITION DIAGNOSIS:   Severe Malnutrition related to chronic illness(recurrent diverticulitis now with a colo-uterine fistula) as evidenced by severe fat depletion, severe muscle depletion, energy intake < 75% for > 7 days.  Ongoing.  GOAL:   Patient will meet greater than or equal to 90% of their needs  Progressing.  MONITOR:   Labs, Weight trends, I & O's, Supplement acceptance, PO intake(TPN)  ASSESSMENT:    84 year old female with past medical history significant for COPD, abdominal aortic aneurysm, chronic constipation and diverticulitis that is recurrent.  Patient has been admitted with another episode of diverticulitis.   Patient in room working with OT. Daughter was not at bedside.   Calorie Count for 10/1: Pt consumed some jello and ~30 ml of cream of chicken soup. 30 ml Prostat BID (200 kcal, 30g protein) was taken. Only 1 Ensure supplement was provided.  TPN continues to infuse at 60 ml/hr (providing 1592 kcal and 82g protein).  Weights are fluctuating. Now +3 lb since yesterday. Pt on Lasix.  Medications: IV Lasix daily, IV KCl   Labs reviewed: CBGs: 130-151 Mg/Phos WNL  Diet Order:   Diet Order            Diet regular Room service appropriate? Yes; Fluid consistency: Thin  Diet effective now              EDUCATION NEEDS:   Education needs have been addressed  Skin:  Skin Assessment: Reviewed RN Assessment  Last BM:  11/24/17- 1 piece colostomy  Height:   Ht Readings from Last 1 Encounters:  11/20/17 5\' 4"  (1.626 m)    Weight:   Wt Readings from Last 1 Encounters:  11/25/17 51 kg     Ideal Body Weight:  54.5 kg  BMI:  Body mass index is 19.3 kg/m.  Estimated Nutritional Needs:   Kcal:  1500-1700 kcal  Protein:  75-90 grams  Fluid:  >/= 1.7 L/day  Clayton Bibles, MS, RD, LDN [place] Dietitian Pager: [phone] After Hours Pager: [phone]

## 2017-11-25 NOTE — Progress Notes (Signed)
Brief Pharmacy note: Patient continues on TPN, regular diet + supplements ordered  MD discontinued moderate q6hr Novolog sliding scale, CBG's avg 150  Will resume Novolog moderate scale ac/hs  Minda Ditto PharmD Pager [phone] 11/25/2017, 7:46 PM

## 2017-11-26 DIAGNOSIS — Z66 Do not resuscitate: Secondary | ICD-10-CM

## 2017-11-26 DIAGNOSIS — K5792 Diverticulitis of intestine, part unspecified, without perforation or abscess without bleeding: Secondary | ICD-10-CM

## 2017-11-26 LAB — BASIC METABOLIC PANEL
ANION GAP: 8 (ref 5–15)
BUN: 49 mg/dL — ABNORMAL HIGH (ref 8–23)
CO2: 28 mmol/L (ref 22–32)
Calcium: 9.5 mg/dL (ref 8.9–10.3)
Chloride: 105 mmol/L (ref 98–111)
Creatinine, Ser: 0.85 mg/dL (ref 0.44–1.00)
GFR calc Af Amer: >60 mL/min (ref >60)
GFR, EST NON AFRICAN AMERICAN: 60 mL/min — AB (ref >60)
Glucose, Bld: 156 mg/dL — ABNORMAL HIGH (ref 70–99)
POTASSIUM: 4 mmol/L (ref 3.5–5.1)
Sodium: 141 mmol/L (ref 135–145)

## 2017-11-26 LAB — PHOSPHORUS: PHOSPHORUS: 3.1 mg/dL (ref 2.5–4.6)

## 2017-11-26 LAB — GLUCOSE, CAPILLARY
GLUCOSE-CAPILLARY: 142 mg/dL — AB (ref 70–99)
Glucose-Capillary: 175 mg/dL — ABNORMAL HIGH (ref 70–99)
Glucose-Capillary: 129 mg/dL — ABNORMAL HIGH (ref 70–99)

## 2017-11-26 LAB — MAGNESIUM: Magnesium: 2.5 mg/dL — ABNORMAL HIGH (ref 1.7–2.4)

## 2017-11-26 MED ORDER — LIDOCAINE 5 % EX PTCH
1.0000 | MEDICATED_PATCH | CUTANEOUS | Status: DC
  Administered 2017-11-26 – 2017-11-27 (×2): 1 via TRANSDERMAL
  Filled 2017-11-26 (×4): qty 1

## 2017-11-26 MED ORDER — ACETAMINOPHEN 500 MG PO TABS
1000.0000 mg | ORAL_TABLET | Freq: Three times a day (TID) | ORAL | Status: DC
  Administered 2017-11-26 – 2017-11-28 (×6): 1000 mg via ORAL
  Filled 2017-11-26 (×7): qty 2

## 2017-11-26 MED ORDER — TRAVASOL 10 % IV SOLN
INTRAVENOUS | Status: DC
  Filled 2017-11-26: qty 820.8

## 2017-11-26 MED ORDER — FENTANYL CITRATE (PF) 100 MCG/2ML IJ SOLN
12.5000 ug | INTRAMUSCULAR | Status: DC | PRN
  Administered 2017-11-27 (×2): 12.5 ug via INTRAVENOUS
  Filled 2017-11-26 (×3): qty 2

## 2017-11-26 NOTE — Consult Note (Signed)
[facility] Nurse ostomy consult note Stoma type/location: LLQ colostomy  Remains edematous Stomal assessment/size: 2" well budded Peristomal assessment: intact  Used barrier ring to improve fit and wear time.   Treatment options for stomal/peristomal skin: barrier ring and 1 piece pouch Output soft brown stool Ostomy pouching: 1pc. Flat with barrier ring  Education provided: Daughter (caregiver) at bedside  Assisted with pouch change  Able to cut pouch to fit.  Understands rationale for barrier ring.  Able to open and roll closed.  SHe has emptied some.  Enrolled patient in [place] program: Yes today

## 2017-11-26 NOTE — Progress Notes (Signed)
[facility]/Trauma Progress Note  6 Days Post-Op   Assessment/Plan Principal Problem: Acute diverticulitis Active Problems: Constipation Colouterine fistula Protein-calorie malnutrition, severe Goals of care, counseling/discussion DNR (do not resuscitate) discussion Palliative care encounter  COPD/ > 100 pack years/on Home O2 Abdominal aortic aneurysm Hypertension HxSVT- metoprolol  Hx FTT/deconditioning-Wheelchair to bed/some walking in room with walker Severemalnutrition - prealbumin 5.2/5.7 Chronic constipation  Recurrent diverticulitis with colo-uterine fistula- draining stool vaginally -s/p laparoscopic diverting end colostomy 9/27 by Dr Barry Dienes  FEN:TNA/reg diet+boost, calorie count ID: [id number]>>rec total 2 weeks of abx DVT: Lovneox Follow up: Dr. Barry Dienes 2 weeks Foley:none  Disposition: PT/OT; encourage PO, Pt is doing well from a surgical standpoint.Wean TNA per medicine.    LOS: 13 days    Subjective: CC: sleepy  Pt states no abdominal pain, nausea or vomiting. She is sleepy. She is not hungry. No family at bedside.   Objective: Vital signs in last 24 hours: Temp:  [98.4 F (36.9 C)-98.7 F (37.1 C)] 98.4 F (36.9 C) (10/03 0351) Pulse Rate:  [101-108] 101 (10/03 0351) Resp:  [16-20] 20 (10/03 0351) BP: (125-172)/(74-86) 141/74 (10/03 0351) SpO2:  [94 %-100 %] 99 % (10/03 0351) Weight:  [50.3 kg] 50.3 kg (10/03 0509) Last BM Date: (unsure)  Intake/Output from previous day: 10/02 0701 - 10/03 0700 In: 2467.7 [P.O.:805; I.V.:1412.7; IV Piggyback:249.9] Out: 2150 [Urine:2150] Intake/Output this shift: Total I/O In: 61 [P.O.:60] Out: 0   PE: Gen: Alert, NAD, pleasant, frail elderly woman Pulm:Rate andeffort normal Abd: Soft, ND, +BS, incisionswith glue intact are well appearing. gas in ostomy bag but no stool.Stoma is pink and edematous. Mild generalized TTP of right hemiabdomen no  guarding. No signs of peritonitis Skin: warm and dry   Anti-infectives: Anti-infectives (From admission, onward)   Start     Dose/Rate Route Frequency Ordered Stop   11/24/17 1600  piperacillin-tazobactam (ZOSYN) IVPB 3.375 g     3.375 g 12.5 mL/hr over 240 Minutes Intravenous Every 8 hours 11/24/17 1023     11/20/17 0745  cefoTEtan (CEFOTAN) 2 g in sodium chloride 0.9 % 100 mL IVPB  Status:  Discontinued     2 g 200 mL/hr over 30 Minutes Intravenous On call to O.R. 11/20/17 1017 11/20/17 0741   11/20/17 0600  cefoTEtan (CEFOTAN) 2 g in sodium chloride 0.9 % 100 mL IVPB     2 g 200 mL/hr over 30 Minutes Intravenous On call to O.R. 11/19/17 0907 11/20/17 1119   11/14/17 0600  piperacillin-tazobactam (ZOSYN) IVPB 3.375 g  Status:  Discontinued     3.375 g 12.5 mL/hr over 240 Minutes Intravenous Every 8 hours 11/13/17 2225 11/24/17 0717   11/13/17 2115  piperacillin-tazobactam (ZOSYN) IVPB 3.375 g     3.375 g 12.5 mL/hr over 240 Minutes Intravenous  Once 11/13/17 2101 11/14/17 0133      Lab Results:  Recent Labs    11/25/17 0416  WBC 13.7*  HGB 9.5*  HCT 30.2*  PLT 387   BMET Recent Labs    11/25/17 0416 11/26/17 0409  NA 142 141  K 3.6 4.0  CL 107 105  CO2 27 28  GLUCOSE 148* 156*  BUN 46* 49*  CREATININE 0.75 0.85  CALCIUM 9.6 9.5   PT/INR No results for input(s): LABPROT, INR in the last 72 hours. CMP     Component Value Date/Time   NA 141 11/26/2017 0409   K 4.0 11/26/2017 0409   CL 105 11/26/2017 0409   CO2 28 11/26/2017 0409  GLUCOSE 156 (H) 11/26/2017 0409   BUN 49 (H) 11/26/2017 0409   CREATININE 0.85 11/26/2017 0409   CALCIUM 9.5 11/26/2017 0409   PROT 6.3 (L) 11/23/2017 0423   ALBUMIN 2.0 (L) 11/23/2017 0423   AST 48 (H) 11/23/2017 0423   ALT 39 11/23/2017 0423   ALKPHOS 81 11/23/2017 0423   BILITOT 0.9 11/23/2017 0423   GFRNONAA 60 (L) 11/26/2017 0409   GFRAA >60 11/26/2017 0409   Lipase     Component Value Date/Time   LIPASE 21  11/13/2017 1658    Studies/Results: No results found.    Kalman Drape , [facility] 11/26/2017, 9:27 AM  Pager: [phone] Mon-Wed, Friday 7:00am-4:30pm Thurs 7am-11:30am  Consults: [phone]

## 2017-11-26 NOTE — Progress Notes (Signed)
I have reviewed and concur with this student's documentation.   

## 2017-11-26 NOTE — Progress Notes (Addendum)
PHARMACY - ADULT TOTAL PARENTERAL NUTRITION CONSULT NOTE   Pharmacy Consult for TPN Indication: severe malnutrition  Patient Measurements: Height: 5\' 4"  (162.6 cm) Weight: 110 lb 14.3 oz (50.3 kg) IBW/kg (Calculated) : 54.7 TPN AdjBW (KG): 56.2 Body mass index is 19.03 kg/m. Usual Weight: 50.4 kg  Insulin Requirements: 4 units in past 24 hours, on moderate SSI scale  Current Nutrition: regular diet -Ensure TID between meals, pro-stat PO BID (given 10/1) -Regular diet  Current TPN formula: Custom TPN at 60 mL/hr, goal rate  IVF: none  Central access: PICC placed in 9/23  TPN start date:  9/23   ASSESSMENT                                                                                                          HPI: Patient is an 84 y.o F with hx recurrent diverticulitis with colo-uterine fistula presented to the ED on 11/13/17 with c/o abd pain.  TPN started on 11/16/17 d/t severe malnutrition.  Significant events:  -9/26 Dr. Verlon Au has asked about increasing the amount of protein in TPN. Spoke to [place] from [place] and is ok with increasing target protein to 80-85 grams/day  -9/27 Colostomy -10/2 IV furosemide decreased to 20 mg IV daily  Today:   Glucose (goal <150): 141-159  Electrolytes:  K 4, phos 4, Na 141; Mag trending up and elevated at 2.5, CorrCa elevated at 11.1 (Ca-Phos product ~44: goal is < 55)  K was slightly increased in TPN on 9/30  No calcium in TPN  Renal: Stable and WNL  LFTs (9/30): AST slightly elevated at 48, ALT WNL  TGs:  73 (9/24), 129 (9/30)  Prealbumin:  5.2 (9/24), 9.8 (9/30)   Patient has BM charted 9/30, but still minimal PO intake. Concerned that appetite may not improve despite encouraged PO intake. Per MD, continue TPN at this time.  NUTRITIONAL GOALS                                                                                             RD recs: 10/2 Kcal:  1500-1700 kcal Protein:  75-90 grams Fluid:  >/= 1.7 L/day  Current  TPN formula: provides 82 grams of protein, 1592 Kcal and 1440 ml per day.   PLAN  At 1800 today:  Continue TPN at 60 ml/hr, goal rate.  Electrolyte modifications to TPN:   Remove Magnesium from TPN starting with new bag on 10/3  Continue with no calcium in TPN  TPN to contain standard multivitamins and trace elements.  Famotidine 20 mg daily added to TPN - lower dose due to renal function  Continue moderate SSI q6h  TPN lab panels on Mondays & Thursdays.   F/u daily.  Dia Sitter, PharmD, BCPS 11/26/2017 7:09 AM  ____________________________  Adden: Per Dr. Wynelle Cleveland, ok to d/c TPN today - at [place], reduce rate of current TPN bag to 30 ml/hr, then d/c TPN bag at 6PM - d/c SSI at [place] will sign off.  Re-consult [place] if need further assistance  Dia Sitter, PharmD, BCPS 11/26/2017 1:01 PM

## 2017-11-26 NOTE — Progress Notes (Signed)
PROGRESS NOTE    Melissa Riddle   LPF:[id number]  DOB: 28-Dec-1928  DOA: 11/13/2017 PCP: Orvis Brill, [facility]   Brief Narrative:  Melissa Riddle 84 year old female with history of AAA, COPD, chronic constipation, recurrent diverticulitis with colo-uterine fistula was admitted on 11/13/2017 for abdominal pain and recurrent diverticulitis.  Patient was started on intravenous antibiotics.  General surgery was consulted.  Palliative care was also consulted.  Patient underwent laparoscopic diverting end colostomy on 11/20/2017 by general surgery.   Subjective: No complaints today.   Assessment & Plan:   Principal Problem:   Acute diverticulitis / Colouterine fistula - -s/p laparoscopic diverting end colostomy 9/27 by Dr Barry Dienes - the patient is receiving education regarding the care of the colostomy - cont Zosyn for total 14 days per general surgery - leukocytosis improving  Active Problems:     Protein-calorie malnutrition, severe - cont ensure and Prostat- oral intake remains poor - d/c TPN today  COPD with chronic respiratory failure - on 2.5 L O2 at baseline  HTN/ h/o SVT - cont Lopressor- ECHO noted below shows Gr 1 d CHF and normal systolic funtion  HLD - on Pravastatin  Abdominal aortic aneurysms -Patient noted to have to falciform infrarenal abdominal aortic aneurysms measuring up to 4.5 cm on CTA abdomen pelvis -Repeat CTA in 6 months and vascular consultation  Mostly wheelchair and bed bound  Disposition - plan is home with hospice once pain is controlled with oral pain medications- d/w daughter at bedside   DVT prophylaxis: Lovenox Code Status: Full code Family Communication:  Disposition Plan: follow oral intake- home with hospice Consultants:   Gen surgery Procedures:    laparoscopic diverting end colostomy 9/27   2 D ECHO - Left ventricle: The cavity size was normal. Systolic function was   normal. The estimated ejection fraction was in the  range of 60%   to 65%. There was no dynamic obstruction. Wall motion was normal;   there were no regional wall motion abnormalities. Doppler   parameters are consistent with abnormal left ventricular   relaxation (grade 1 diastolic dysfunction). Doppler parameters   are consistent with elevated mean left atrial filling pressure. - Mitral valve: Calcified annulus. There was mild systolic anterior   motion of the anterior leaflet. There was mild regurgitation. - Left atrium: The atrium was mildly dilated. - Pulmonary arteries: Systolic pressure was mildly increased. PA   peak pressure: 38 mm Hg (S).   Antimicrobials:  Anti-infectives (From admission, onward)   Start     Dose/Rate Route Frequency Ordered Stop   11/24/17 1600  piperacillin-tazobactam (ZOSYN) IVPB 3.375 g     3.375 g 12.5 mL/hr over 240 Minutes Intravenous Every 8 hours 11/24/17 1023     11/20/17 0745  cefoTEtan (CEFOTAN) 2 g in sodium chloride 0.9 % 100 mL IVPB  Status:  Discontinued     2 g 200 mL/hr over 30 Minutes Intravenous On call to O.R. 11/20/17 5329 11/20/17 0741   11/20/17 0600  cefoTEtan (CEFOTAN) 2 g in sodium chloride 0.9 % 100 mL IVPB     2 g 200 mL/hr over 30 Minutes Intravenous On call to O.R. 11/19/17 0907 11/20/17 1119   11/14/17 0600  piperacillin-tazobactam (ZOSYN) IVPB 3.375 g  Status:  Discontinued     3.375 g 12.5 mL/hr over 240 Minutes Intravenous Every 8 hours 11/13/17 2225 11/24/17 0717   11/13/17 2115  piperacillin-tazobactam (ZOSYN) IVPB 3.375 g     3.375 g 12.5 mL/hr over 240 Minutes Intravenous  Once 11/13/17 2101 11/14/17 0133       Objective: Vitals:   11/26/17 0509 11/26/17 1010 11/26/17 1400 11/26/17 1505  BP:  138/72 (!) 156/81 139/79  Pulse:  98 95 91  Resp:      Temp:  98.6 F (37 C) 98.7 F (37.1 C)   TempSrc:  Oral Oral   SpO2:  100% 100%   Weight: 50.3 kg     Height:        Intake/Output Summary (Last 24 hours) at 11/26/2017 1604 Last data filed at 11/26/2017  1405 Gross per 24 hour  Intake 2314.56 ml  Output 1350 ml  Net 964.56 ml   Filed Weights   11/24/17 0508 11/25/17 0513 11/26/17 0509  Weight: 49.4 kg 51 kg 50.3 kg    Examination: General exam: Appears comfortable  HEENT: PERRLA, oral mucosa moist, no sclera icterus or thrush Respiratory system: Clear to auscultation. Respiratory effort normal. Cardiovascular system: S1 & S2 heard,  No murmurs  Gastrointestinal system: Abdomen soft, non-tender, nondistended. Normal bowel sound. No organomegaly Central nervous system: Alert and oriented. No focal neurological deficits. Extremities: No cyanosis, clubbing or edema Skin: No rashes or ulcers Psychiatry:  Mood & affect appropriate.   Data Reviewed: I have personally reviewed following labs and imaging studies  CBC: Recent Labs  Lab 11/20/17 0321 11/21/17 0328 11/22/17 0413 11/23/17 0423 11/25/17 0416  WBC 10.6* 16.4* 16.3* 13.7* 13.7*  NEUTROABS  --  13.8* 12.7* 10.7* 10.4*  HGB 7.4* 10.1* 10.2* 10.0* 9.5*  HCT 23.8* 31.1* 31.8* 31.1* 30.2*  MCV 90.5 89.1 89.8 90.1 91.0  PLT 435* 383 401* 376 841   Basic Metabolic Panel: Recent Labs  Lab 11/20/17 0321 11/21/17 0328 11/22/17 0413 11/23/17 0423 11/25/17 0416 11/26/17 0409  NA 137 138 140 139 142 141  K 4.2 4.1 3.7 3.7 3.6 4.0  CL 105 107 105 105 107 105  CO2 27 26 25 26 27 28   GLUCOSE 112* 206* 132* 151* 148* 156*  BUN 22 25* 31* 31* 46* 49*  CREATININE 0.68 0.67 0.79 0.72 0.75 0.85  CALCIUM 8.7* 8.6* 8.9 9.0 9.6 9.5  MG 2.3 2.1  --  2.2 2.4 2.5*  PHOS 3.4 2.7  --  3.1 4.1 3.1   GFR: Estimated Creatinine Clearance: 37 mL/min (by C-G formula based on SCr of 0.85 mg/dL). Liver Function Tests: Recent Labs  Lab 11/21/17 0328 11/22/17 0413 11/23/17 0423  AST 34 71* 48*  ALT 20 43 39  ALKPHOS 42 79 81  BILITOT 0.5 1.0 0.9  PROT 5.7* 6.3* 6.3*  ALBUMIN 1.8* 2.0* 2.0*   No results for input(s): LIPASE, AMYLASE in the last 168 hours. No results for input(s):  AMMONIA in the last 168 hours. Coagulation Profile: Recent Labs  Lab 11/20/17 0321  INR 0.96   Cardiac Enzymes: No results for input(s): CKTOTAL, CKMB, CKMBINDEX, TROPONINI in the last 168 hours. BNP (last 3 results) No results for input(s): PROBNP in the last 8760 hours. HbA1C: No results for input(s): HGBA1C in the last 72 hours. CBG: Recent Labs  Lab 11/25/17 1139 11/25/17 1701 11/25/17 2107 11/26/17 0742 11/26/17 1148  GLUCAP 141* 159* 157* 142* 175*   Lipid Profile: No results for input(s): CHOL, HDL, LDLCALC, TRIG, CHOLHDL, LDLDIRECT in the last 72 hours. Thyroid Function Tests: No results for input(s): TSH, T4TOTAL, FREET4, T3FREE, THYROIDAB in the last 72 hours. Anemia Panel: No results for input(s): VITAMINB12, FOLATE, FERRITIN, TIBC, IRON, RETICCTPCT in the last 72 hours. Urine analysis:  Component Value Date/Time   COLORURINE YELLOW 11/13/2017 2047   APPEARANCEUR HAZY (A) 11/13/2017 2047   LABSPEC 1.006 11/13/2017 2047   PHURINE 8.0 11/13/2017 2047   GLUCOSEU NEGATIVE 11/13/2017 2047   HGBUR NEGATIVE 11/13/2017 2047   BILIRUBINUR NEGATIVE 11/13/2017 2047   KETONESUR NEGATIVE 11/13/2017 2047   PROTEINUR NEGATIVE 11/13/2017 2047   NITRITE NEGATIVE 11/13/2017 2047   LEUKOCYTESUR NEGATIVE 11/13/2017 2047   Sepsis Labs: @LABRCNTIP (procalcitonin:4,lacticidven:4) ) Recent Results (from the past 240 hour(s))  Surgical pcr screen     Status: Abnormal   Collection Time: 11/20/17  7:25 AM  Result Value Ref Range Status   MRSA, PCR POSITIVE (A) NEGATIVE Final    Comment: RESULT CALLED TO, READ BACK BY AND VERIFIED WITH: GRINDSTAFF,S @ 1220 ON [id number] BY POTEAT,S    Staphylococcus aureus POSITIVE (A) NEGATIVE Final    Comment: (NOTE) The Xpert SA Assay (FDA approved for NASAL specimens in patients 61 years of age and older), is one component of a comprehensive surveillance program. It is not intended to diagnose infection nor to guide or monitor  treatment. Performed at [facility], [place] [address], [place], [place] [address]          Radiology Studies: No results found.    Scheduled Meds: . acetaminophen  1,000 mg Oral TID  . enoxaparin (LOVENOX) injection  30 mg Subcutaneous Q24H  . feeding supplement (ENSURE ENLIVE)  237 mL Oral TID BM  . feeding supplement (PRO-STAT SUGAR FREE 64)  30 mL Oral BID  . insulin aspart  0-15 Units Subcutaneous TID WC  . insulin aspart  0-5 Units Subcutaneous QHS  . lidocaine  1 patch Transdermal Q24H  . metoprolol tartrate  12.5 mg Oral BID  . polyethylene glycol  17 g Oral BID  . pravastatin  40 mg Oral q1800   Continuous Infusions: . sodium chloride 10 mL/hr at 11/24/17 1800  . piperacillin-tazobactam (ZOSYN)  IV 3.375 g (11/26/17 0812)     LOS: 13 days    Time spent in minutes: 35    Debbe Odea, MD [facility] Pager: [email or web address] Password [email or web address] 11/26/2017, 4:04 PM

## 2017-11-26 NOTE — Progress Notes (Signed)
Palliative:  I was called today to meet with daughter, Melissa Riddle. Melissa Riddle was asleep during our visit. Melissa Riddle and I spoke further about her mother's decline and concern that she may not improve. Melissa Riddle is more receptive today to this concept. We discussed a plan to move forward with a goal for Melissa Riddle to take her mother home. We discussed weaning off TPN and changing pain management with the goal to get her home. Melissa Riddle does want to ensure that her mother is medically optimized prior to discharge. Melissa Riddle is hopeful that she can work with her mother to increase appetite and activity once she is home BUT also able to acknowledge that her mother may not improve. I also further discussed code status and recommendation for DNR given that [name] would not recover from a resuscitation attempt ([name] clearly stated that she would not want resuscitation "if no chance" and I believe this would be the case at this stage). Melissa Riddle confirms desire for DNR and that she has spoken with husband about this as well and they at this time agree with DNR status.   Plan:  Pain: Scheduled Tylenol po 1000 TID. Fentanyl 12.5 mcg IV every 2 hours prn. If tolerating some po and tolerating po Tylenol will change to po opioid tomorrow. Lidoderm to low back for chronic pain.   Poor appetite: Wean TPN with goal for home. Attempt to [employer] with chocolate Ensure. Continue to awaken and encourage small frequent meals as well as supplements.   Encourage OOB to chair and work with PT.   Home with hospice to [name]'s home.   Bowels: If any constipation I would recommend Senokot-S 1 tablet daily.   DNR decided.   Exam: Sleeping but arouses briefly. No distress. Denies pain. Pleasant but too sleepy to participate in conversation.   [id number] am 90 min  Vinie Sill, NP Palliative Medicine Team Pager # [phone] (M-F 8a-5p) Team Phone # [phone] (Nights/Weekends)

## 2017-11-27 LAB — GLUCOSE, CAPILLARY
Glucose-Capillary: 84 mg/dL (ref 70–99)
Glucose-Capillary: 85 mg/dL (ref 70–99)
Glucose-Capillary: 132 mg/dL — ABNORMAL HIGH (ref 70–99)
Glucose-Capillary: 112 mg/dL — ABNORMAL HIGH (ref 70–99)
Glucose-Capillary: 95 mg/dL (ref 70–99)

## 2017-11-27 LAB — CBC
HCT: 31.5 % — ABNORMAL LOW (ref 36.0–46.0)
Hemoglobin: 9.8 g/dL — ABNORMAL LOW (ref 12.0–15.0)
MCH: 28.5 pg (ref 26.0–34.0)
MCHC: 31.1 g/dL (ref 30.0–36.0)
MCV: 91.6 fL (ref 78.0–100.0)
PLATELETS: 526 10*3/uL — AB (ref 150–400)
RBC: 3.44 MIL/uL — AB (ref 3.87–5.11)
RDW: 16.7 % — ABNORMAL HIGH (ref 11.5–15.5)
WBC: 15.4 10*3/uL — AB (ref 4.0–10.5)

## 2017-11-27 MED ORDER — OXYCODONE HCL 5 MG PO TABS
10.0000 mg | ORAL_TABLET | ORAL | Status: DC | PRN
  Administered 2017-11-27: 10 mg via ORAL
  Filled 2017-11-27: qty 2

## 2017-11-27 MED ORDER — FENTANYL CITRATE (PF) 100 MCG/2ML IJ SOLN: 12.5000 ug | INTRAMUSCULAR | Status: DC | PRN

## 2017-11-27 MED ORDER — OXYCODONE HCL 5 MG PO TABS
5.0000 mg | ORAL_TABLET | ORAL | Status: DC | PRN
  Administered 2017-11-27: 5 mg via ORAL
  Filled 2017-11-27: qty 1

## 2017-11-27 NOTE — Progress Notes (Signed)
Occupational Therapy Treatment Patient Details Name: Melissa Riddle MRN: [id number] DOB: 1929/02/02 Today's Date: 11/27/2017    History of present illness 84 yo female admitted with acute diverticulitis. S/P lap end colostomy 9/27. Hx of COPD-O2 dep, chronic diarrhea, AAA   OT comments  Daughter present and VERY encouraging of BUE exercise with her mom. Pt with good participation of active BUE ROM    Follow Up Recommendations  Home health OT;Supervision/Assistance - 24 hour    Equipment Recommendations  None recommended by OT    Recommendations for Other Services      Precautions / Restrictions Precautions Precautions: Fall Precaution Comments: O2 dep; incontinent; L colostomy. VERY FRAGILE SKIN ARMS & LEGS  Restrictions Weight Bearing Restrictions: No              ADL either performed or assessed with clinical judgement   ADL Overall ADL's : Needs assistance/impaired     Grooming: Minimal assistance                       Toileting- Clothing Manipulation and Hygiene: Total assistance;Bed level         General ADL Comments: Pt performed BUE  GENTLE AROM of shoulder, elbow, wrist and hand. Pt did well this with .  BUE supported on pillows after finishing BUE AROM               Cognition Arousal/Alertness: Awake/alert Behavior During Therapy: WFL for tasks assessed/performed Overall Cognitive Status: No family/caregiver present to determine baseline cognitive functioning                                          Exercises  BUE AROM while pt in bed with HOB raised           Pertinent Vitals/ Pain       Pain Assessment: No/denies pain         Frequency  Min 2X/week        Progress Toward Goals  OT Goals(current goals can now be found in the care plan section)  Progress towards OT goals: OT to reassess next treatment     Plan Discharge plan remains appropriate       AM-PAC PT "6 Clicks" Daily Activity      Outcome Measure   Help from another person eating meals?: A Little Help from another person taking care of personal grooming?: A Little Help from another person toileting, which includes using toliet, bedpan, or urinal?: Total Help from another person bathing (including washing, rinsing, drying)?: A Lot Help from another person to put on and taking off regular upper body clothing?: A Lot Help from another person to put on and taking off regular lower body clothing?: Total 6 Click Score: 12    End of Session    OT Visit Diagnosis: Muscle weakness (generalized) (M62.81);History of falling (Z91.81)   Activity Tolerance Patient limited by fatigue   Patient Left in bed;with call bell/phone within reach   Nurse Communication Mobility status        Time: 1015-1030 OT Time Calculation (min): 15 min  Charges: OT General Charges $OT Visit: 1 Visit OT Treatments $Therapeutic Exercise: 8-22 mins  Kari Baars, [place] [phone] Office- [phone] [place], Edwena Felty D 11/27/2017, 12:28 PM

## 2017-11-27 NOTE — Progress Notes (Addendum)
PROGRESS NOTE    Melissa Riddle   DJM:[id number]  DOB: 11/01/1928  DOA: 11/13/2017 PCP: Orvis Brill, [facility]   Brief Narrative:  Melissa Riddle 84 year old female with history of AAA, COPD, chronic constipation, recurrent diverticulitis with colo-uterine fistula was admitted on 11/13/2017 for abdominal pain and recurrent diverticulitis.  Patient was started on intravenous antibiotics.  General surgery was consulted.  Palliative care was also consulted.  Patient underwent laparoscopic diverting end colostomy on 11/20/2017 by general surgery.   Subjective: Having pain in her lower abdomen today and asking for pain medications.   Assessment & Plan:   Principal Problem:   Acute diverticulitis / Colouterine fistula -s/p laparoscopic diverting end colostomy 9/27 by Dr Barry Dienes - the patient is receiving education regarding the care of the colostomy - cont Zosyn for total 14 days per general surgery- she has completed the course - WBC count is increased today- surgery wanting to obtain a CT of the abdomen pelvis    Active Problems:     Protein-calorie malnutrition, severe - cont ensure and Prostat- oral intake remains has improved after d/c'd TPN on 10/3  COPD with chronic respiratory failure - on 2.5 L O2 at baseline  HTN/ h/o SVT - cont Lopressor- ECHO noted below shows Gr 1 d CHF and normal systolic funtion  HLD - on Pravastatin  Abdominal aortic aneurysms -Patient noted to have to falciform infrarenal abdominal aortic aneurysms measuring up to 4.5 cm on CTA abdomen pelvis -Repeat CTA in 6 months and vascular consultation  Mostly wheelchair and bed bound  Disposition - plan was home with hospice once pain but as the patient is eating better today, the daughter is considering going home with home health. I feel this is appropriate for now and have ordered it.   DVT prophylaxis: Lovenox Code Status: DNR Family Communication: daughter  Disposition Plan: follow oral  intake- home with hospice when pain better controlled on oral pain medications Consultants:   Gen surgery Procedures:    laparoscopic diverting end colostomy 9/27   2 D ECHO - Left ventricle: The cavity size was normal. Systolic function was   normal. The estimated ejection fraction was in the range of 60%   to 65%. There was no dynamic obstruction. Wall motion was normal;   there were no regional wall motion abnormalities. Doppler   parameters are consistent with abnormal left ventricular   relaxation (grade 1 diastolic dysfunction). Doppler parameters   are consistent with elevated mean left atrial filling pressure. - Mitral valve: Calcified annulus. There was mild systolic anterior   motion of the anterior leaflet. There was mild regurgitation. - Left atrium: The atrium was mildly dilated. - Pulmonary arteries: Systolic pressure was mildly increased. PA   peak pressure: 38 mm Hg (S).   Antimicrobials:  Anti-infectives (From admission, onward)   Start     Dose/Rate Route Frequency Ordered Stop   11/24/17 1600  piperacillin-tazobactam (ZOSYN) IVPB 3.375 g  Status:  Discontinued     3.375 g 12.5 mL/hr over 240 Minutes Intravenous Every 8 hours 11/24/17 1023 11/27/17 0949   11/20/17 0745  cefoTEtan (CEFOTAN) 2 g in sodium chloride 0.9 % 100 mL IVPB  Status:  Discontinued     2 g 200 mL/hr over 30 Minutes Intravenous On call to O.R. 11/20/17 2229 11/20/17 0741   11/20/17 0600  cefoTEtan (CEFOTAN) 2 g in sodium chloride 0.9 % 100 mL IVPB     2 g 200 mL/hr over 30 Minutes Intravenous On call to O.R.  11/19/17 0907 11/20/17 1119   11/14/17 0600  piperacillin-tazobactam (ZOSYN) IVPB 3.375 g  Status:  Discontinued     3.375 g 12.5 mL/hr over 240 Minutes Intravenous Every 8 hours 11/13/17 2225 11/24/17 0717   11/13/17 2115  piperacillin-tazobactam (ZOSYN) IVPB 3.375 g     3.375 g 12.5 mL/hr over 240 Minutes Intravenous  Once 11/13/17 2101 11/14/17 0133       Objective: Vitals:     11/26/17 1505 11/26/17 2019 11/27/17 0520 11/27/17 1331  BP: 139/79 135/66 128/61 114/65  Pulse: 91 90 98 78  Resp:  15 15 16   Temp:  97.9 F (36.6 C) 98.3 F (36.8 C) 97.8 F (36.6 C)  TempSrc:  Oral Oral Oral  SpO2:  100% 100% 100%  Weight:      Height:        Intake/Output Summary (Last 24 hours) at 11/27/2017 1505 Last data filed at 11/27/2017 1332 Gross per 24 hour  Intake 599.93 ml  Output 1150 ml  Net -550.07 ml   Filed Weights   11/24/17 0508 11/25/17 0513 11/26/17 0509  Weight: 49.4 kg 51 kg 50.3 kg    Examination: General exam: Appears comfortable - cachectic  HEENT: PERRLA, oral mucosa moist, no sclera icterus or thrush Respiratory system: Clear to auscultation. Respiratory effort normal. Cardiovascular system: S1 & S2 heard,  No murmurs  Gastrointestinal system: Abdomen soft,  Tender in right lower quadrant, nondistended. Normal bowel sound. No organomegaly Central nervous system: Alert and oriented. No focal neurological deficits. Extremities: No cyanosis, clubbing or edema Skin: No rashes or ulcers Psychiatry:  Mood & affect appropriate.   Data Reviewed: I have personally reviewed following labs and imaging studies  CBC: Recent Labs  Lab 11/21/17 0328 11/22/17 0413 11/23/17 0423 11/25/17 0416 11/27/17 1444  WBC 16.4* 16.3* 13.7* 13.7* 15.4*  NEUTROABS 13.8* 12.7* 10.7* 10.4*  --   HGB 10.1* 10.2* 10.0* 9.5* 9.8*  HCT 31.1* 31.8* 31.1* 30.2* 31.5*  MCV 89.1 89.8 90.1 91.0 91.6  PLT 383 401* 376 387 979*   Basic Metabolic Panel: Recent Labs  Lab 11/21/17 0328 11/22/17 0413 11/23/17 0423 11/25/17 0416 11/26/17 0409  NA 138 140 139 142 141  K 4.1 3.7 3.7 3.6 4.0  CL 107 105 105 107 105  CO2 26 25 26 27 28   GLUCOSE 206* 132* 151* 148* 156*  BUN 25* 31* 31* 46* 49*  CREATININE 0.67 0.79 0.72 0.75 0.85  CALCIUM 8.6* 8.9 9.0 9.6 9.5  MG 2.1  --  2.2 2.4 2.5*  PHOS 2.7  --  3.1 4.1 3.1   GFR: Estimated Creatinine Clearance: 37 mL/min (by  C-G formula based on SCr of 0.85 mg/dL). Liver Function Tests: Recent Labs  Lab 11/21/17 0328 11/22/17 0413 11/23/17 0423  AST 34 71* 48*  ALT 20 43 39  ALKPHOS 42 79 81  BILITOT 0.5 1.0 0.9  PROT 5.7* 6.3* 6.3*  ALBUMIN 1.8* 2.0* 2.0*   No results for input(s): LIPASE, AMYLASE in the last 168 hours. No results for input(s): AMMONIA in the last 168 hours. Coagulation Profile: No results for input(s): INR, PROTIME in the last 168 hours. Cardiac Enzymes: No results for input(s): CKTOTAL, CKMB, CKMBINDEX, TROPONINI in the last 168 hours. BNP (last 3 results) No results for input(s): PROBNP in the last 8760 hours. HbA1C: No results for input(s): HGBA1C in the last 72 hours. CBG: Recent Labs  Lab 11/26/17 1148 11/26/17 1711 11/27/17 0127 11/27/17 0806 11/27/17 1231  GLUCAP 175* 129* 84  85 132*   Lipid Profile: No results for input(s): CHOL, HDL, LDLCALC, TRIG, CHOLHDL, LDLDIRECT in the last 72 hours. Thyroid Function Tests: No results for input(s): TSH, T4TOTAL, FREET4, T3FREE, THYROIDAB in the last 72 hours. Anemia Panel: No results for input(s): VITAMINB12, FOLATE, FERRITIN, TIBC, IRON, RETICCTPCT in the last 72 hours. Urine analysis:    Component Value Date/Time   COLORURINE YELLOW 11/13/2017 2047   APPEARANCEUR HAZY (A) 11/13/2017 2047   LABSPEC 1.006 11/13/2017 2047   PHURINE 8.0 11/13/2017 2047   GLUCOSEU NEGATIVE 11/13/2017 2047   HGBUR NEGATIVE 11/13/2017 2047   BILIRUBINUR NEGATIVE 11/13/2017 2047   KETONESUR NEGATIVE 11/13/2017 2047   PROTEINUR NEGATIVE 11/13/2017 2047   NITRITE NEGATIVE 11/13/2017 2047   LEUKOCYTESUR NEGATIVE 11/13/2017 2047   Sepsis Labs: @LABRCNTIP (procalcitonin:4,lacticidven:4) ) Recent Results (from the past 240 hour(s))  Surgical pcr screen     Status: Abnormal   Collection Time: 11/20/17  7:25 AM  Result Value Ref Range Status   MRSA, PCR POSITIVE (A) NEGATIVE Final    Comment: RESULT CALLED TO, READ BACK BY AND VERIFIED  WITH: GRINDSTAFF,S @ 1220 ON [id number] BY POTEAT,S    Staphylococcus aureus POSITIVE (A) NEGATIVE Final    Comment: (NOTE) The Xpert SA Assay (FDA approved for NASAL specimens in patients 39 years of age and older), is one component of a comprehensive surveillance program. It is not intended to diagnose infection nor to guide or monitor treatment. Performed at [facility], [place] [address] [place], [place] [address]          Radiology Studies: No results found.    Scheduled Meds: . acetaminophen  1,000 mg Oral TID  . enoxaparin (LOVENOX) injection  30 mg Subcutaneous Q24H  . feeding supplement (ENSURE ENLIVE)  237 mL Oral TID BM  . feeding supplement (PRO-STAT SUGAR FREE 64)  30 mL Oral BID  . lidocaine  1 patch Transdermal Q24H  . metoprolol tartrate  12.5 mg Oral BID  . polyethylene glycol  17 g Oral BID  . pravastatin  40 mg Oral q1800   Continuous Infusions: . sodium chloride 10 mL/hr at 11/24/17 1800     LOS: 14 days    Time spent in minutes: 45 - > 50% of time taken in coordinating care, speaking with patients daughter, case management, general surgery and RN    Debbe Odea, MD [facility] Pager: [email or web address] Password [email or web address] 11/27/2017, 3:05 PM

## 2017-11-27 NOTE — Care Management Note (Signed)
Case Management Note  Patient Details  Name: Melissa Riddle MRN: [id number] Date of Birth: 1928-09-14  Subjective/Objective:  Spoke with patient and daughter at bedside. Daughter is interested in home hospice, no preference for agency.                  Action/Plan: Contacted [facility], provided them with clinicals and demographics. They will f/u with daughter.   Expected Discharge Date:                  Expected Discharge Plan:  Home w Hospice Care  In-House Referral:  NA  Discharge planning Services  CM Consult  Post Acute Care Choice:  Hospice Choice offered to:  Patient, Adult Children  DME Arranged:  N/A DME Agency:  NA  HH Arranged:  Disease Management [place] Agency:  [facility]  Status of Service:  Completed, signed off  If discussed at [employer], dates discussed:    Additional Comments:  Guadalupe Maple, RN 11/27/2017, 11:08 AM

## 2017-11-27 NOTE — Care Management Note (Signed)
Case Management Note  Patient Details  Name: Melissa Riddle MRN: [id number] Date of Birth: March 21, 1928  Subjective/Objective:  Received calls from [facility] and the attending that the daughter has changed her mind and does not want hospice. She prefers home with [facility] services with [facility].               Action/Plan: Contacted [facility] for the referral, they have accepted. Plan for d/c tomorrow, will need PTAR transport  Expected Discharge Date:                  Expected Discharge Plan:  [place]  In-House Referral:  NA  Discharge planning Services  CM Consult  Post Acute Care Choice:  Home Health Choice offered to:  Patient, Adult Children  DME Arranged:  N/A DME Agency:  NA  HH Arranged:  Disease Management, RN, PT, OT, Social Work [employer] Agency:  [facility] [employer]  Status of Service:  Completed, signed off  If discussed at [employer], dates discussed:    Additional Comments:  Guadalupe Maple, RN 11/27/2017, 3:53 PM

## 2017-11-27 NOTE — Care Management Important Message (Signed)
Important Message  Patient Details  Name: Melissa Riddle MRN: [id number] Date of Birth: [birth year]   Medicare Important Message Given:  Yes    Kerin Salen 11/27/2017, 10:52 AMImportant Message  Patient Details  Name: Melissa Riddle MRN: [id number] Date of Birth: 08-08-1928   Medicare Important Message Given:  Yes    Kerin Salen 11/27/2017, 10:52 AM

## 2017-11-27 NOTE — Progress Notes (Signed)
[facility]/Trauma Progress Note  7 Days Post-Op   Assessment/Plan Principal Problem: Acute diverticulitis Active Problems: Constipation Colouterine fistula Protein-calorie malnutrition, severe Goals of care, counseling/discussion DNR (do not resuscitate) discussion Palliative care encounter  COPD/ >100 pack years/on Home O2 Abdominal aortic aneurysm Hypertension HxSVT- metoprolol  Hx FTT/deconditioning-Wheelchair to bed/some walking in room with walker Severemalnutrition - prealbumin 5.2/5.7 Chronic constipation  Recurrent diverticulitis with colo-uterine fistula -s/p laparoscopic diverting end colostomy 9/27 by Dr Barry Dienes - having bowel function  FEN:reg diet ID: [id number]/20-10/04 DVT: Lovneox Follow up: Dr. Barry Dienes 2 weeks Foley:none  Disposition: PT/OT; encourage PO, Pt is doing well from a surgical standpoint.    LOS: 14 days    Subjective: CC: right sided abdominal pain  Pt states pain about the same as yesterday but slightly worse this am. No nausea, vomiting, fever, or chills. No family at bedside.   Objective: Vital signs in last 24 hours: Temp:  [97.9 F (36.6 C)-98.7 F (37.1 C)] 98.3 F (36.8 C) (10/04 0520) Pulse Rate:  [90-98] 98 (10/04 0520) Resp:  [15] 15 (10/04 0520) BP: (128-156)/(61-81) 128/61 (10/04 0520) SpO2:  [100 %] 100 % (10/04 0520) Last BM Date: 11/23/17  Intake/Output from previous day: 10/03 0701 - 10/04 0700 In: 1074.9 [P.O.:360; I.V.:564.9; IV Piggyback:150] Out: 750 [Urine:750] Intake/Output this shift: Total I/O In: 60 [P.O.:60] Out: 550 [Urine:550]  PE: Gen: Alert, NAD, pleasant, frail elderly woman Pulm:Rate andeffort normal Abd: Soft, ND, +BS, incisionswith glue intact are well appearing. gas and brown stool in ostomy. Stoma is pink and edematous. Mild generalized TTPof right hemiabdomen no guarding. No signs of peritonitis Skin: warm and  dry   Anti-infectives: Anti-infectives (From admission, onward)   Start     Dose/Rate Route Frequency Ordered Stop   11/24/17 1600  piperacillin-tazobactam (ZOSYN) IVPB 3.375 g     3.375 g 12.5 mL/hr over 240 Minutes Intravenous Every 8 hours 11/24/17 1023     11/20/17 0745  cefoTEtan (CEFOTAN) 2 g in sodium chloride 0.9 % 100 mL IVPB  Status:  Discontinued     2 g 200 mL/hr over 30 Minutes Intravenous On call to O.R. 11/20/17 3557 11/20/17 0741   11/20/17 0600  cefoTEtan (CEFOTAN) 2 g in sodium chloride 0.9 % 100 mL IVPB     2 g 200 mL/hr over 30 Minutes Intravenous On call to O.R. 11/19/17 0907 11/20/17 1119   11/14/17 0600  piperacillin-tazobactam (ZOSYN) IVPB 3.375 g  Status:  Discontinued     3.375 g 12.5 mL/hr over 240 Minutes Intravenous Every 8 hours 11/13/17 2225 11/24/17 0717   11/13/17 2115  piperacillin-tazobactam (ZOSYN) IVPB 3.375 g     3.375 g 12.5 mL/hr over 240 Minutes Intravenous  Once 11/13/17 2101 11/14/17 0133      Lab Results:  Recent Labs    11/25/17 0416  WBC 13.7*  HGB 9.5*  HCT 30.2*  PLT 387   BMET Recent Labs    11/25/17 0416 11/26/17 0409  NA 142 141  K 3.6 4.0  CL 107 105  CO2 27 28  GLUCOSE 148* 156*  BUN 46* 49*  CREATININE 0.75 0.85  CALCIUM 9.6 9.5   PT/INR No results for input(s): LABPROT, INR in the last 72 hours. CMP     Component Value Date/Time   NA 141 11/26/2017 0409   K 4.0 11/26/2017 0409   CL 105 11/26/2017 0409   CO2 28 11/26/2017 0409   GLUCOSE 156 (H) 11/26/2017 0409   BUN 49 (H) 11/26/2017  0409   CREATININE 0.85 11/26/2017 0409   CALCIUM 9.5 11/26/2017 0409   PROT 6.3 (L) 11/23/2017 0423   ALBUMIN 2.0 (L) 11/23/2017 0423   AST 48 (H) 11/23/2017 0423   ALT 39 11/23/2017 0423   ALKPHOS 81 11/23/2017 0423   BILITOT 0.9 11/23/2017 0423   GFRNONAA 60 (L) 11/26/2017 0409   GFRAA >60 11/26/2017 0409   Lipase     Component Value Date/Time   LIPASE 21 11/13/2017 1658    Studies/Results: No results  found.    Kalman Drape , [facility] 11/27/2017, 9:46 AM  Pager: [phone] Mon-Wed, Friday 7:00am-4:30pm Thurs 7am-11:30am  Consults: [phone]

## 2017-11-27 NOTE — Progress Notes (Signed)
Palliative:  I spoke today with daughter, Melissa Riddle. She had questions about rehospitalization and hospice care. She does not want her mother to know she is getting hospice care (Melissa Riddle is likely too sleepy and ill to know). We discussed rehospitalization and that it depends on the situation. IF Melissa Riddle were to go home and were to actually show some improvements and have an unrelated reversible condition than hospitalization could be considered. However, if she returns home and continues to decline then rehospitalization would not be beneficial to her and hospice can assist to ensure her comfort. I explained that one of the benefits of having hospice assistance is to help explain the situation so they can know the risks and benefits and expectations of options given the particular situation. Melissa Riddle was satisfied and continues to desire hospice care at home.   Melissa Riddle is sleeping soundly. Her lunch tray is at bedside and she has eaten better today (~3/4 of a baked potato)! Agree with OxyIR prn. Monitor for efficacy as well as oversedation. She should be prepared to go home soon.   15 min  Vinie Sill, NP Palliative Medicine Team Pager # [phone] (M-F 8a-5p) Team Phone # [phone] (Nights/Weekends)

## 2017-11-27 NOTE — Progress Notes (Signed)
Physical Therapy Treatment Patient Details Name: Melissa Riddle MRN: [id number] DOB: 08/23/1928 Today's Date: 11/27/2017    History of Present Illness 84 yo female admitted with acute diverticulitis. S/P lap end colostomy 9/27. Hx of COPD-O2 dep, chronic diarrhea, AAA    PT Comments    Bed level LE exercises only on today. Pt participated fairly well. Stimulation required to keep her alert and participatory.    Follow Up Recommendations  Home health PT;Supervision/Assistance - 24 hour(per chart, plan is for hospice)     Equipment Recommendations  Hospital bed    Recommendations for Other Services       Precautions / Restrictions Precautions Precautions: Fall Precaution Comments: O2 dep; incontinent; L colostomy. VERY FRAGILE SKIN ARMS & LEGS  Restrictions Weight Bearing Restrictions: No    Mobility  Bed Mobility               General bed mobility comments: NT  Transfers                    Ambulation/Gait                 Stairs             Wheelchair Mobility    Modified Rankin (Stroke Patients Only)       Balance                                            Cognition Arousal/Alertness: Awake/alert Behavior During Therapy: WFL for tasks assessed/performed Overall Cognitive Status: No family/caregiver present to determine baseline cognitive functioning                                 General Comments: follows 1 step commands well      Exercises General Exercises - Lower Extremity Ankle Circles/Pumps: AROM;Both;10 reps;Supine Quad Sets: AROM;Both;10 reps;Supine Heel Slides: AAROM;Both;10 reps;Supine Hip ABduction/ADduction: AAROM;Both;10 reps;Supine    General Comments        Pertinent Vitals/Pain Pain Assessment: Faces Faces Pain Scale: Hurts little more Pain Location: abdomen Pain Descriptors / Indicators: Discomfort Pain Intervention(s): Limited activity within patient's tolerance     Home Living                      Prior Function            PT Goals (current goals can now be found in the care plan section) Progress towards PT goals: Progressing toward goals    Frequency    Min 2X/week      PT Plan Current plan remains appropriate    Co-evaluation              AM-PAC PT "6 Clicks" Daily Activity  Outcome Measure  Difficulty turning over in bed (including adjusting bedclothes, sheets and blankets)?: Unable Difficulty moving from lying on back to sitting on the side of the bed? : Unable Difficulty sitting down on and standing up from a chair with arms (e.g., wheelchair, bedside commode, etc,.)?: Unable Help needed moving to and from a bed to chair (including a wheelchair)?: Total Help needed walking in hospital room?: Total Help needed climbing 3-5 steps with a railing? : Total 6 Click Score: 6    End of Session   Activity Tolerance: Patient tolerated treatment well;Patient limited by fatigue  Patient left: in bed;with call bell/phone within reach;with bed alarm set   PT Visit Diagnosis: Muscle weakness (generalized) (M62.81);Other abnormalities of gait and mobility (R26.89)     Time: 6599-3570 PT Time Calculation (min) (ACUTE ONLY): 8 min  Charges:  $Therapeutic Exercise: 8-22 mins                       Weston Anna, PT Acute Rehabilitation Services Pager: [phone] Office: [phone]

## 2017-11-28 ENCOUNTER — Inpatient Hospital Stay (HOSPITAL_COMMUNITY): Payer: Medicare FFS

## 2017-11-28 ENCOUNTER — Encounter (HOSPITAL_COMMUNITY): Payer: Self-pay

## 2017-11-28 DIAGNOSIS — R627 Adult failure to thrive: Secondary | ICD-10-CM

## 2017-11-28 DIAGNOSIS — I471 Supraventricular tachycardia, unspecified: Secondary | ICD-10-CM

## 2017-11-28 DIAGNOSIS — I10 Essential (primary) hypertension: Secondary | ICD-10-CM

## 2017-11-28 LAB — BASIC METABOLIC PANEL
ANION GAP: 9 (ref 5–15)
BUN: 44 mg/dL — ABNORMAL HIGH (ref 8–23)
CO2: 30 mmol/L (ref 22–32)
Calcium: 10.5 mg/dL — ABNORMAL HIGH (ref 8.9–10.3)
Chloride: 105 mmol/L (ref 98–111)
Creatinine, Ser: 0.7 mg/dL (ref 0.44–1.00)
GFR calc non Af Amer: >60 mL/min (ref >60)
Glucose, Bld: 106 mg/dL — ABNORMAL HIGH (ref 70–99)
POTASSIUM: 3.9 mmol/L (ref 3.5–5.1)
SODIUM: 144 mmol/L (ref 135–145)

## 2017-11-28 LAB — CBC
HEMATOCRIT: 32.8 % — AB (ref 36.0–46.0)
HEMOGLOBIN: 10.1 g/dL — AB (ref 12.0–15.0)
MCH: 28.1 pg (ref 26.0–34.0)
MCHC: 30.8 g/dL (ref 30.0–36.0)
MCV: 91.4 fL (ref 78.0–100.0)
Platelets: 615 10*3/uL — ABNORMAL HIGH (ref 150–400)
RBC: 3.59 MIL/uL — ABNORMAL LOW (ref 3.87–5.11)
RDW: 16.6 % — ABNORMAL HIGH (ref 11.5–15.5)
WBC: 13 10*3/uL — AB (ref 4.0–10.5)

## 2017-11-28 LAB — GLUCOSE, CAPILLARY: Glucose-Capillary: 92 mg/dL (ref 70–99)

## 2017-11-28 MED ORDER — BISACODYL 5 MG PO TBEC: 5.0000 mg | DELAYED_RELEASE_TABLET | Freq: Every day | ORAL | 1 refills | Status: AC | PRN

## 2017-11-28 MED ORDER — POLYETHYLENE GLYCOL 3350 17 G PO PACK: 17.0000 g | PACK | Freq: Two times a day (BID) | ORAL | 0 refills | Status: DC

## 2017-11-28 MED ORDER — ZINC OXIDE 40 % EX OINT: TOPICAL_OINTMENT | Freq: Four times a day (QID) | CUTANEOUS | 0 refills | Status: AC | PRN

## 2017-11-28 MED ORDER — LIDOCAINE 5 % EX PTCH: 1.0000 | MEDICATED_PATCH | CUTANEOUS | 0 refills | Status: AC

## 2017-11-28 MED ORDER — PRO-STAT SUGAR FREE PO LIQD: 30.0000 mL | Freq: Two times a day (BID) | ORAL | 0 refills | Status: AC

## 2017-11-28 MED ORDER — TRAMADOL HCL 50 MG PO TABS: 25.0000 mg | ORAL_TABLET | Freq: Four times a day (QID) | ORAL | 0 refills | Status: DC | PRN

## 2017-11-28 MED ORDER — PRO-STAT SUGAR FREE PO LIQD: 30.0000 mL | Freq: Two times a day (BID) | ORAL | 0 refills | Status: DC

## 2017-11-28 MED ORDER — BISACODYL 5 MG PO TBEC: 5.0000 mg | DELAYED_RELEASE_TABLET | Freq: Every day | ORAL | 1 refills | Status: DC | PRN

## 2017-11-28 MED ORDER — ACETAMINOPHEN 500 MG PO TABS: 1000.0000 mg | ORAL_TABLET | Freq: Three times a day (TID) | ORAL | 0 refills | Status: AC

## 2017-11-28 MED ORDER — LIDOCAINE 5 % EX PTCH: 1.0000 | MEDICATED_PATCH | CUTANEOUS | 0 refills | Status: DC

## 2017-11-28 MED ORDER — ENSURE ENLIVE PO LIQD: 237.0000 mL | Freq: Three times a day (TID) | ORAL | 12 refills | Status: DC

## 2017-11-28 MED ORDER — OXYCODONE HCL 5 MG PO CAPS: 5.0000 mg | ORAL_CAPSULE | ORAL | 0 refills | Status: DC | PRN

## 2017-11-28 MED ORDER — ENSURE ENLIVE PO LIQD: 237.0000 mL | Freq: Three times a day (TID) | ORAL | 12 refills | Status: AC

## 2017-11-28 MED ORDER — ZINC OXIDE 40 % EX OINT: TOPICAL_OINTMENT | Freq: Four times a day (QID) | CUTANEOUS | 0 refills | Status: DC | PRN

## 2017-11-28 MED ORDER — TRAMADOL HCL 50 MG PO TABS: 25.0000 mg | ORAL_TABLET | Freq: Four times a day (QID) | ORAL | 0 refills | Status: AC | PRN

## 2017-11-28 MED ORDER — OXYCODONE HCL 5 MG PO CAPS: 5.0000 mg | ORAL_CAPSULE | ORAL | 0 refills | Status: AC | PRN

## 2017-11-28 MED ORDER — SENNA 8.6 MG PO TABS: 1.0000 | ORAL_TABLET | Freq: Every day | ORAL | 0 refills | Status: AC

## 2017-11-28 MED ORDER — POLYETHYLENE GLYCOL 3350 17 G PO PACK: 17.0000 g | PACK | Freq: Two times a day (BID) | ORAL | 0 refills | Status: AC

## 2017-11-28 MED ORDER — ALBUTEROL SULFATE (2.5 MG/3ML) 0.083% IN NEBU: 3.0000 mL | INHALATION_SOLUTION | RESPIRATORY_TRACT | 12 refills | Status: AC | PRN

## 2017-11-28 MED ORDER — IOPAMIDOL (ISOVUE-300) INJECTION 61%
INTRAVENOUS | Status: AC
  Filled 2017-11-28: qty 30

## 2017-11-28 MED ORDER — SENNA 8.6 MG PO TABS: 1.0000 | ORAL_TABLET | Freq: Every day | ORAL | 0 refills | Status: DC

## 2017-11-28 MED ORDER — IOHEXOL 300 MG/ML  SOLN
75.0000 mL | Freq: Once | INTRAMUSCULAR | Status: AC | PRN
  Administered 2017-11-28: 75 mL via INTRAVENOUS

## 2017-11-28 MED ORDER — ACETAMINOPHEN 500 MG PO TABS: 1000.0000 mg | ORAL_TABLET | Freq: Three times a day (TID) | ORAL | 0 refills | Status: DC

## 2017-11-28 NOTE — Care Management (Signed)
PTAR arranged. Jonnie Finner RN CCM Case Mgmt phone [phone]

## 2017-11-28 NOTE — Progress Notes (Signed)
[facility]/Tra[facility] Progress Note  8 Days Post-Op   Assessment/Plan Principal Problem: Acute diverticulitis Active Problems: Constipation Colouterine fistula Protein-calorie malnutrition, severe Goals of care, counseling/discussion DNR (do not resuscitate) discussion Palliative care encounter  COPD/ >100 pack years/on Home O2 Abdominal aortic aneurysm Hypertension HxSVT- metoprolol  Hx FTT/deconditioning-Wheelchair to bed/some walking in room with walker Severemalnutrition - prealbumin 5.2/5.7 Chronic constipation  Recurrent diverticulitis with colo-uterine fistula -s/p laparoscopic diverting end colostomy 9/27 by Dr Barry Dienes - having bowel function - getting CT today for elevated wbc  FEN:reg diet ID: Zosyn9/20-10/04 DVT: Lovneox Follow up: Dr. Barry Dienes 2 weeks Foley:none  Disposition: PT/OT; encourage PO, Pt is doing well from a surgical standpoint.    LOS: 15 days    Subjective: CC: right sided abdominal pain  Pt states pain about the same as yesterday. No nausea, vomiting, fever, or chills. Family at bedside.   Objective: Vital signs in last 24 hours: Temp:  [97.8 F (36.6 C)] 97.8 F (36.6 C) (10/04 2144) Pulse Rate:  [78-96] 96 (10/05 0608) Resp:  [14-16] 16 (10/05 0608) BP: (114-146)/(64-70) 145/70 (10/05 0608) SpO2:  [97 %-100 %] 97 % (10/05 0608) Weight:  [48 kg-48.1 kg] 48 kg (10/05 0500) Last BM Date: 11/28/17  Intake/Output from previous day: 10/04 0701 - 10/05 0700 In: 841.7 [P.O.:600; I.V.:241.7] Out: 1650 [Urine:1100; Stool:550] Intake/Output this shift: Total I/O In: 0  Out: 50 [Stool:50]  PE: Gen: Alert, NAD, pleasant, frail elderly woman Pulm:Rate andeffort normal Abd: Soft, moderately distended, incisionswith glue intact are well appearing. gas and brown stool in ostomy. Stoma is pink and edematous. Mild generalized TTPof right hemiabdomen no guarding. No signs of  peritonitis Skin: warm and dry   Anti-infectives: Anti-infectives (From admission, onward)   Start     Dose/Rate Route Frequency Ordered Stop   11/24/17 1600  piperacillin-tazobactam (ZOSYN) IVPB 3.375 g  Status:  Discontinued     3.375 g 12.5 mL/hr over 240 Minutes Intravenous Every 8 hours 11/24/17 1023 11/27/17 0949   11/20/17 0745  cefoTEtan (CEFOTAN) 2 g in sodium chloride 0.9 % 100 mL IVPB  Status:  Discontinued     2 g 200 mL/hr over 30 Minutes Intravenous On call to O.R. 11/20/17 1062 11/20/17 0741   11/20/17 0600  cefoTEtan (CEFOTAN) 2 g in sodium chloride 0.9 % 100 mL IVPB     2 g 200 mL/hr over 30 Minutes Intravenous On call to O.R. 11/19/17 0907 11/20/17 1119   11/14/17 0600  piperacillin-tazobactam (ZOSYN) IVPB 3.375 g  Status:  Discontinued     3.375 g 12.5 mL/hr over 240 Minutes Intravenous Every 8 hours 11/13/17 2225 11/24/17 0717   11/13/17 2115  piperacillin-tazobactam (ZOSYN) IVPB 3.375 g     3.375 g 12.5 mL/hr over 240 Minutes Intravenous  Once 11/13/17 2101 11/14/17 0133      Lab Results:  Recent Labs    11/27/17 1444 11/28/17 0848  WBC 15.4* 13.0*  HGB 9.8* 10.1*  HCT 31.5* 32.8*  PLT 526* 615*   BMET Recent Labs    11/26/17 0409 11/28/17 0848  NA 141 144  K 4.0 3.9  CL 105 105  CO2 28 30  GLUCOSE 156* 106*  BUN 49* 44*  CREATININE 0.85 0.70  CALCIUM 9.5 10.5*   PT/INR No results for input(s): LABPROT, INR in the last 72 hours. CMP     Component Value Date/Time   NA 144 11/28/2017 0848   K 3.9 11/28/2017 0848   CL 105 11/28/2017 0848  CO2 30 11/28/2017 0848   GLUCOSE 106 (H) 11/28/2017 0848   BUN 44 (H) 11/28/2017 0848   CREATININE 0.70 11/28/2017 0848   CALCIUM 10.5 (H) 11/28/2017 0848   PROT 6.3 (L) 11/23/2017 0423   ALBUMIN 2.0 (L) 11/23/2017 0423   AST 48 (H) 11/23/2017 0423   ALT 39 11/23/2017 0423   ALKPHOS 81 11/23/2017 0423   BILITOT 0.9 11/23/2017 0423   GFRNONAA >60 11/28/2017 0848   GFRAA >60 11/28/2017 0848    Lipase     Component Value Date/Time   LIPASE 21 11/13/2017 1658    Studies/Results: No results found.   Rosario Adie, MD  [facility]

## 2017-11-28 NOTE — Discharge Summary (Signed)
Physician Discharge Summary  Marcela Alatorre WEX:[id number] DOB: April 22, 1928 DOA: 11/13/2017  PCP: Orvis Brill, [facility]  Admit date: 11/13/2017 Discharge date: 11/28/2017  Admitted From: home Disposition:  home   Recommendations for Outpatient Follow-up:  1. F/u on oral intake- recommend Bmet in 1-2 wks 2. 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 Patient was started on intravenous antibiotics. General surgery was consulted. Palliative care was also consulted. Patient underwent laparoscopic diverting end colostomy on 11/20/2017 by general surgery.   Hospital Course:  Principal Problem:   Acute diverticulitis / Colouterine fistula -s/p laparoscopic diverting end colostomy 9/27 by Dr Barry Dienes - the patient is receiving education regarding the care of the colostomy - cont Zosyn for total 14 days per general surgery- she has completed the course and General surgery does not feel she need further antibiotics - WBC count is increased today- surgery wanting to obtain a CT of the abdomen pelvis which was done today. I have  reviewed the report and have spoken with Dr Leighton Ruff who feels that the changes seen on the CT are appropriate post op changes. She feels there is no further intervention needed at this time. I will be discharging the patient home today.     Active Problems:     Protein-calorie malnutrition, severe - cont ensure and Prostat- oral intake  has improved significantly after d/c'd TPN on 10/3  COPD with chronic respiratory failure - on 2.5 L O2 at baseline  HTN/ h/o SVT - cont Lopressor- ECHO noted below shows Gr 1 d CHF and normal systolic funtion  HLD - on Pravastatin  Abdominal aortic aneurysms -Patient noted to have to falciform infrarenal abdominal aortic aneurysms measuring up to 4.5 cm on CTA abdomen pelvis -Repeat CTA in 6 months and vascular consultation  Mostly wheelchair and bed bound  Disposition - plan was home with hospice as her oral intake was extremely poor. However, once her TPN was dicontinued, the patient's appetite has returned and she is eating and drinking better. Her daughter is considering going home with home health. I feel this is appropriate for now and have ordered it.     Discharge Exam: Vitals:   11/27/17 2144 11/28/17 0608  BP: (!) 146/64 (!) 145/70  Pulse: 80 96  Resp: 14 16  Temp: 97.8 F (36.6 C)   SpO2: 100% 97%   Vitals:   11/27/17 1331 11/27/17 2144 11/28/17 0500 11/28/17 0608  BP: 114/65 (!) 146/64  (!) 145/70  Pulse: 78 80  96  Resp: 16 14  16   Temp: 97.8 F (36.6 C) 97.8 F (36.6 C)    TempSrc: Oral Oral    SpO2: 100% 100%  97%  Weight:  48.1 kg 48 kg  Height:        General: Pt is alert, awake, not in acute distress- cachectic Cardiovascular: RRR, S1/S2 +, no rubs, no gallops Respiratory: CTA bilaterally, no wheezing, no rhonchi Abdominal: Soft, NT, ND, bowel sounds +,  Extremities: no edema, no cyanosis   Discharge Instructions   Allergies as of 11/28/2017   No Known Allergies     Medication List     STOP taking these medications   Bronson PO     TAKE these medications   acetaminophen 500 MG tablet Commonly known as:  TYLENOL Take 2 tablets (1,000 mg total) by mouth 3 (three) times daily. What changed:    medication strength  how much to take  when to take this  reasons to take this  Another medication with the same name was removed. Continue taking this medication, and follow the directions you see here.   albuterol 108 (90 Base) MCG/ACT inhaler Commonly known as:  PROVENTIL HFA;VENTOLIN HFA Inhale 2 puffs into the lungs daily. What changed:  Another medication with the same name was added. Make sure you understand how and when to take each.   albuterol (2.5 MG/3ML) 0.083% nebulizer solution Commonly known as:  PROVENTIL Inhale 3 mLs into the lungs every 4 (four) hours as needed for wheezing or shortness of breath. What changed:  You were already taking a medication with the same name, and this prescription was added. Make sure you understand how and when to take each.   bisacodyl 5 MG EC tablet Commonly known as:  DULCOLAX Take 1 tablet (5 mg total) by mouth daily as needed for moderate constipation.   cholecalciferol 1000 units tablet Commonly known as:  VITAMIN D Take 1,000 Units by mouth daily.   diclofenac sodium 1 % Gel Commonly known as:  VOLTAREN Apply 2 g topically 4 (four) times daily.   feeding supplement (ENSURE ENLIVE) Liqd Take 237 mLs by mouth 3 (three) times daily between meals.   feeding supplement (PRO-STAT SUGAR FREE 64) Liqd Take 30 mLs by mouth 2 (two) times daily.   lidocaine 5 % Commonly known as:  LIDODERM Place 1 patch onto the skin daily. Remove & Discard patch within 12 hours or as directed by MD   liver oil-zinc oxide 40 % ointment Commonly known as:  DESITIN Apply topically 4 (four) times daily as needed for irritation (incontinent dermatitis).   lovastatin 40 MG tablet Commonly known as:  MEVACOR Take  40 mg by mouth daily.   metoprolol tartrate 25 MG tablet Commonly known as:  LOPRESSOR Take 0.5 tablets (12.5 mg total) by mouth 2 (two) times daily.   oxycodone 5 MG capsule Commonly known as:  OXY-IR Take 1 capsule (5 mg total) by mouth every 4 (four) hours as needed.   polyethylene glycol packet Commonly known as:  MIRALAX / GLYCOLAX Take 17 g by mouth 2 (two) times daily. What changed:  when to take this   ranitidine 150 MG tablet Commonly known as:  ZANTAC Take 150 mg by mouth daily.   senna 8.6 MG Tabs tablet Commonly known as:  SENOKOT Take 1 tablet (8.6 mg total) by mouth daily.   traMADol 50 MG tablet Commonly known as:  ULTRAM Take 0.5 tablets (25 mg total) by mouth every 6 (six) hours as needed for up to 30 doses for moderate pain (can give along with acetaminophen). What changed:  reasons to take this      Follow-up Information    [facility]  Follow up.   Specialty:  [place] Why:  phyiscal therapy, occupational therapy, nursing and social worker Contact information: [place] [address] [phone]        [place] Follow up.   Specialty:  Geriatric Medicine Contact information: [place] [address] [phone]        Stark Klein, MD Follow up.   Specialty:  General Surgery Contact information: [place] [address] [phone]          No Known Allergies   Procedures/Studies:    laparoscopic diverting end colostomy 9/27   2 D ECHO - Left ventricle: The cavity size was normal. Systolic function was normal. The estimated ejection fraction was in the range of 60% to 65%. There was no dynamic obstruction. Wall motion was normal; there were no regional wall motion abnormalities. Doppler parameters are consistent with abnormal left ventricular relaxation (grade 1 diastolic dysfunction). Doppler  parameters are consistent with elevated mean left atrial filling pressure. - Mitral valve: Calcified annulus. There was mild systolic anterior motion of the anterior leaflet. There was mild regurgitation. - Left atrium: The atrium was mildly dilated. - Pulmonary arteries: Systolic pressure was mildly increased. PA peak pressure: 38 mm Hg (S).   Dg Chest 1 View  Result Date: 11/21/2017 CLINICAL DATA:  Tachypnea EXAM: CHEST  1 VIEW COMPARISON:  11/18/2017, 11/05/2017, 07/13/2017 FINDINGS: Right upper extremity catheter tip over the SVC. Small left pleural effusion, increased compared to prior. Probable tiny right effusion. Worsening airspace disease at the left base. Stable cardiomediastinal silhouette with vascular congestion and mild interstitial edema. Aortic atherosclerosis. No pneumothorax. IMPRESSION: 1. Increasing left pleural effusion and left basilar disease. 2. Vascular congestion with increased edema. Suspect small right pleural effusion. Electronically Signed   By: Donavan Foil M.D.   On: 11/21/2017 19:44   Dg Chest 2 View  Result Date: 11/19/2017 CLINICAL DATA:  Preoperative examination prior to diverting colostomy tomorrow. History of COPD. Former smoker. EXAM: CHEST - 2 VIEW COMPARISON:  Portable chest x-ray of November 05, 2017 FINDINGS: The lungs are mildly hyperinflated. The interstitial markings are coarse. There are small bilateral pleural effusions. The heart and pulmonary vascularity are normal. There is calcification in the wall of the thoracic aorta. The right sided PICC line tip projects over the junction of the middle and distal thirds of the SVC. IMPRESSION: Chronic bronchitic changes. Slight interval increase in the pulmonary interstitial markings in the mid and upper lungs may reflect interstitial edema of noncardiac or cardiac cause. Small bilateral pleural effusions. Thoracic aortic atherosclerosis. Electronically Signed   By: David  Martinique M.D.   On: 11/19/2017  12:48   Ct Abdomen Pelvis W Contrast  Result Date: 11/28/2017 CLINICAL DATA:  Abdominal pain and fever. Clinical concern for a diverticular abscess. EXAM: CT ABDOMEN AND PELVIS WITH CONTRAST TECHNIQUE: Multidetector CT imaging of the abdomen and pelvis was performed using the standard protocol following bolus administration of intravenous contrast. CONTRAST:  58mL OMNIPAQUE IOHEXOL 300 MG/ML  SOLN COMPARISON:  11/13/2017. FINDINGS: Lower chest: The lungs are hyperexpanded at the lung bases with diffuse peribronchial thickening and mild bibasilar atelectasis/scarring. Hepatobiliary: No focal liver abnormality is seen. Status post cholecystectomy. No biliary dilatation. Pancreas: Diffusely atrophied. Spleen: Multiple calcified granulomata. Adrenals/Urinary Tract: The previously demonstrating layering density in the posterior urinary bladder on the right has a more rounded configuration today, measuring 1.4 cm on image number 67 series  2. Unremarkable adrenal glands, kidneys and ureters. Stomach/Bowel: A left lower quadrant colostomy is again demonstrated with prominent stool in the colon proximal to the colostomy. There is fluid diverticulosis involving the unattached mid and distal sigmoid colon and some stool in the rectum. There is swirling of the mesentery and a few mildly prominent loops of small bowel with other normal caliber small bowel loops. Interval swirling of the mesentery in the upper pelvis in the midline. Unremarkable stomach. Vascular/Lymphatic: Diffuse atheromatous arterial calcifications. Previously described bilobed infrarenal abdominal aortic aneurysm, measuring 4.4 cm in maximum diameter on image number 28 series 2. This measured 4.5 cm previously. The more distal portion measures 3.6 cm in maximum diameter, without significant change from measured at the same level. No enlarged lymph nodes. Reproductive: Similar gas collection and soft tissue thickening between the distal sigmoid colon and  uterus. This appears to be involving the myometrium. There is also soft tissue thickening extending superiorly on the right with interval adjacent small bowel wall thickening and small amount of extraluminal fluid, measuring 10 x 7 mm on image number 57 series 2. This measures 11 mm in length on image number 50 series 6. Other: Minimal free peritoneal fluid. Musculoskeletal: Stable L4 vertebral compression deformity and kyphoplasty material. Lumbar and lower thoracic spine degenerative changes. Mild scoliosis. IMPRESSION: 1. Continued findings suspicious for a fistula between the excluded distal sigmoid colon and uterus with progressive inflammatory changes extending superiorly between small bowel loops with associated interval secondary small bowel wall thickening and luminal narrowing and a small interloop fluid collection measuring 11 x 10 x 7 mm, suspicious for a small abscess. 2. Small number of interval mildly dilated small bowel loops. This could be due to ileus or partial obstruction by the secondary inflammatory changes described above. 3. The excluded distal sigmoid colon and rectum contain fluid with multiple sigmoid diverticula noted. 4. Left pelvic proximal sigmoid colostomy with prominent stool in the bowel proximal to the colostomy. 5. Previously described bilobed infrarenal abdominal aortic aneurysm with a maximum diameter of 4.4 cm. See the previous follow-up recommendation. 6. More mass like appearance of dependent debris in the urinary bladder on the right. No true mass is seen. Electronically Signed   By: Claudie Revering M.D.   On: 11/28/2017 12:16   Ct Abdomen Pelvis W Contrast  Result Date: 11/13/2017 CLINICAL DATA:  Postprandial abdominal pain today. History of abdominal aortic aneurysm. EXAM: CT ABDOMEN AND PELVIS WITH CONTRAST TECHNIQUE: Multidetector CT imaging of the abdomen and pelvis was performed using the standard protocol following bolus administration of intravenous contrast.  CONTRAST:  160mL ISOVUE-300 IOPAMIDOL (ISOVUE-300) INJECTION 61% COMPARISON:  CT abdomen and pelvis October 31, 2017 FINDINGS: LOWER CHEST: Moderate bilateral pleural effusions increased from prior examination with compressive atelectasis. Included heart size is normal. No pericardial effusion. HEPATOBILIARY: Status post cholecystectomy. Minimal similar postoperative intrahepatic biliary dilatation. No discrete RIGHT hepatic lesion, prior finding could reflect hemangioma. PANCREAS: Normal. SPLEEN: Calcified granulomas. ADRENALS/URINARY TRACT: Kidneys are orthotopic, demonstrating symmetric enhancement. No hydronephrosis or solid renal masses. Early excretion of contrast resulting in appearance of small nephrolithiasis. The unopacified ureters are normal in course and caliber. Delayed imaging through the kidneys demonstrates symmetric prompt contrast excretion within the proximal urinary collecting system. Urinary bladder is well distended, minimal layering density (series 2, image 72). Normal adrenal glands. STOMACH/BOWEL: Moderate sigmoid diverticulosis without acute diverticulitis. Similar anteriorly directed sigmoid colonic thick-walled outpouching containing gas and stool inseparable from uterus. Similar thick-walled short segment of sigmoid colon. Moderate  amount of retained large bowel stool. Small amount of small bowel feces compatible with chronic stasis. Inflamed loop of small bowel within the pelvis, likely reactive. VASCULAR/LYMPHATIC: Stable peripherally calcified 4.5 cm infrarenal aortic aneurysm and 3.1 cm infrarenal aortic aneurysm with severe calcific atherosclerosis. No lymphadenopathy by CT size criteria. REPRODUCTIVE: Normal. OTHER: No intraperitoneal free fluid or free air. MUSCULOSKELETAL: Nonacute. Old mild L4 compression fracture, status post cement augmentation. Osteopenia. IMPRESSION: 1. Chronic sigmoid colonic diverticulitis with short thick-walled segment, neoplasm not excluded. Recommend  GI consultation. 2. Similar pericolonic outpouching with gas inseparable from uterus concerning for fistula. 3. Moderate amount of retained large bowel stool without bowel obstruction. 4. Moderate bilateral pleural effusions increased from prior examination. 5. Redemonstration of bilobed infrarenal aortic aneurysm measuring to 4.5 cm. Recommend followup by abdomen and pelvis CTA in 6 months, and vascular surgery referral/consultation if not already obtained. This recommendation follows ACR consensus guidelines: White Paper of the ACR Incidental Findings Committee II on Vascular Findings. J Am Coll Radiol 2013; 10:789-794. Electronically Signed   By: Elon Alas M.D.   On: 11/13/2017 19:45   Ct Abdomen Pelvis W Contrast  Result Date: 10/31/2017 CLINICAL DATA:  Lower abdominal pain.  Suspected diverticulitis. EXAM: CT ABDOMEN AND PELVIS WITH CONTRAST TECHNIQUE: Multidetector CT imaging of the abdomen and pelvis was performed using the standard protocol following bolus administration of intravenous contrast. CONTRAST:  148mL ISOVUE-300 IOPAMIDOL (ISOVUE-300) INJECTION 61% COMPARISON:  07/10/2017 FINDINGS: Lower Chest: Small bilateral pleural effusions are mildly increased since previous study. Hepatobiliary: 2.0 cm heterogeneous low-attenuation lesion in the right hepatic lobe adjacent gallbladder fossa shows no significant change but has indeterminate characteristics. No other liver masses identified. Prior cholecystectomy. No evidence of biliary obstruction. Pancreas:  No mass or inflammatory changes. Spleen: Within normal limits in size and appearance. Adrenals/Urinary Tract: No masses identified. No evidence of hydronephrosis. Stomach/Bowel: Sigmoid diverticulosis is again demonstrated. Increased wall thickening is seen involving the mid to distal sigmoid colon, consistent with acute diverticulitis. There is also a blind-ending fistula seen along the left lateral wall of the sigmoid colon, without evidence  of abscess. Large amount of stool is also seen throughout the colon. Small bowel is nondilated and unremarkable in appearance. Vascular/Lymphatic: No pathologically enlarged lymph nodes. A bilobed infrarenal abdominal aortic aneurysm is again seen which measures 4.5 cm in maximum diameter, and is not significantly changed since prior exam. Reproductive:  No mass or other significant abnormality. Other:  None. Musculoskeletal: No suspicious bone lesions identified. Old L4 vertebral body compression fracture with prior vertebroplasty again noted. 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 No evidence of abscess. Large stool burden noted; recommend clinical correlation for possible constipation. Increased small bilateral pleural effusions. Stable bilobed infrarenal abdominal aortic aneurysm measuring 4.5 cm. Recommend followup by abdomen and pelvis CTA in 6 months, and vascular surgery referral/consultation if not already obtained. This recommendation follows ACR consensus guidelines: White Paper of the ACR Incidental Findings Committee II on Vascular Findings. J Am Coll Radiol 2013; 10:789-794. Stable 2 cm indeterminate lesion in the right hepatic lobe. Recommend abdomen MRI without and with contrast for further evaluation if patient can cooperate with breath holding instructions. Electronically Signed   By: Earle Gell M.D.   On: 10/31/2017 20:24   Dg Chest Port 1 View  Result Date: 11/23/2017 CLINICAL DATA:  Shortness of Breath EXAM: PORTABLE CHEST 1 VIEW COMPARISON:  11/21/2017 FINDINGS: Right PICC line remains in place, unchanged. Small left pleural effusion  with left basilar atelectasis or infiltrate. Slight improvement in aeration at the left base since prior study. No confluent opacity on the right. Heart is borderline in size. IMPRESSION: Continue small left effusion with slight improved left base atelectasis or infiltrate. Electronically Signed    By: Rolm Baptise M.D.   On: 11/23/2017 07:22   Dg Chest Port 1 View  Result Date: 11/05/2017 CLINICAL DATA:  Lung crackles EXAM: PORTABLE CHEST 1 VIEW COMPARISON:  07/13/2017 FINDINGS: Small bilateral pleural effusions. Left basilar airspace disease. Stable cardiomediastinal silhouette with aortic atherosclerosis. Vascular congestion with mild interstitial and hazy edema. No pneumothorax. IMPRESSION: 1. Small bilateral pleural effusions with left basilar atelectasis or infiltrate 2. Vascular congestion with mild interstitial and hazy edema Electronically Signed   By: Donavan Foil M.D.   On: 11/05/2017 20:10   [place] Ekg Site Rite  Result Date: 11/16/2017 If Site Rite image not attached, placement could not be confirmed due to current cardiac rhythm.    The results of significant diagnostics from this hospitalization (including imaging, microbiology, ancillary and laboratory) are listed below for reference.     Microbiology: Recent Results (from the past 240 hour(s))  Surgical pcr screen     Status: Abnormal   Collection Time: 11/20/17  7:25 AM  Result Value Ref Range Status   MRSA, PCR POSITIVE (A) NEGATIVE Final    Comment: RESULT CALLED TO, READ BACK BY AND VERIFIED WITH: GRINDSTAFF,S @ 1220 ON [id number] BY POTEAT,S    Staphylococcus aureus POSITIVE (A) NEGATIVE Final    Comment: (NOTE) The Xpert SA Assay (FDA approved for NASAL specimens in patients 64 years of age and older), is one component of a comprehensive surveillance program. It is not intended to diagnose infection nor to guide or monitor treatment. Performed at [facility], [place] [address], [place], [place] [address]      Labs: BNP (last 3 results) Recent Labs    07/10/17 1110  BNP 102.7*   Basic Metabolic Panel: Recent Labs  Lab 11/22/17 0413 11/23/17 0423 11/25/17 0416 11/26/17 0409 11/28/17 0848  NA 140 139 142 141 144  K 3.7 3.7 3.6 4.0 3.9  CL 105 105 107 105 105  CO2 25 26 27 28 30    GLUCOSE 132* 151* 148* 156* 106*  BUN 31* 31* 46* 49* 44*  CREATININE 0.79 0.72 0.75 0.85 0.70  CALCIUM 8.9 9.0 9.6 9.5 10.5*  MG  --  2.2 2.4 2.5*  --   PHOS  --  3.1 4.1 3.1  --    Liver Function Tests: Recent Labs  Lab 11/22/17 0413 11/23/17 0423  AST 71* 48*  ALT 43 39  ALKPHOS 79 81  BILITOT 1.0 0.9  PROT 6.3* 6.3*  ALBUMIN 2.0* 2.0*   No results for input(s): LIPASE, AMYLASE in the last 168 hours. No results for input(s): AMMONIA in the last 168 hours. CBC: Recent Labs  Lab 11/22/17 0413 11/23/17 0423 11/25/17 0416 11/27/17 1444 11/28/17 0848  WBC 16.3* 13.7* 13.7* 15.4* 13.0*  NEUTROABS 12.7* 10.7* 10.4*  --   --   HGB 10.2* 10.0* 9.5* 9.8* 10.1*  HCT 31.8* 31.1* 30.2* 31.5* 32.8*  MCV 89.8 90.1 91.0 91.6 91.4  PLT 401* 376 387 526* 615*   Cardiac Enzymes: No results for input(s): CKTOTAL, CKMB, CKMBINDEX, TROPONINI in the last 168 hours. BNP: Invalid input(s): POCBNP CBG: Recent Labs  Lab 11/27/17 0806 11/27/17 1231 11/27/17 1724 11/27/17 2145 11/28/17 0813  GLUCAP 85 132* 112* 95 92  D-Dimer No results for input(s): DDIMER in the last 72 hours. Hgb A1c No results for input(s): HGBA1C in the last 72 hours. Lipid Profile No results for input(s): CHOL, HDL, LDLCALC, TRIG, CHOLHDL, LDLDIRECT in the last 72 hours. Thyroid function studies No results for input(s): TSH, T4TOTAL, T3FREE, THYROIDAB in the last 72 hours.  Invalid input(s): FREET3 Anemia work up No results for input(s): VITAMINB12, FOLATE, FERRITIN, TIBC, IRON, RETICCTPCT in the last 72 hours. Urinalysis    Component Value Date/Time   COLORURINE YELLOW 11/13/2017 2047   APPEARANCEUR HAZY (A) 11/13/2017 2047   LABSPEC 1.006 11/13/2017 2047   PHURINE 8.0 11/13/2017 2047   GLUCOSEU NEGATIVE 11/13/2017 2047   HGBUR NEGATIVE 11/13/2017 2047   BILIRUBINUR NEGATIVE 11/13/2017 2047   KETONESUR NEGATIVE 11/13/2017 2047   PROTEINUR NEGATIVE 11/13/2017 2047   NITRITE NEGATIVE 11/13/2017  2047   LEUKOCYTESUR NEGATIVE 11/13/2017 2047   Sepsis Labs Invalid input(s): PROCALCITONIN,  WBC,  LACTICIDVEN Microbiology Recent Results (from the past 240 hour(s))  Surgical pcr screen     Status: Abnormal   Collection Time: 11/20/17  7:25 AM  Result Value Ref Range Status   MRSA, PCR POSITIVE (A) NEGATIVE Final    Comment: RESULT CALLED TO, READ BACK BY AND VERIFIED WITH: GRINDSTAFF,S @ 1220 ON [id number] BY POTEAT,S    Staphylococcus aureus POSITIVE (A) NEGATIVE Final    Comment: (NOTE) The Xpert SA Assay (FDA approved for NASAL specimens in patients 77 years of age and older), is one component of a comprehensive surveillance program. It is not intended to diagnose infection nor to guide or monitor treatment. Performed at [facility], [place] [address], [place], [place] [address]      Time coordinating discharge in minutes: 37   SIGNED:   Debbe Odea, MD  [facility] 11/28/2017, 1:11 PM Pager   If 7PM-7AM, please contact night-coverage [email or web address] Password [email or web address]

## 2018-09-26 IMAGING — DX DG CHEST 2V
2 series · 2 of 2 positions shown · non-contrast
Comparison: Portable chest x-ray November 05, 2017

CLINICAL DATA: Preoperative examination prior to diverting
colostomy tomorrow. History of COPD. Former smoker.

EXAM:
CHEST - 2 VIEW

[chest lat]
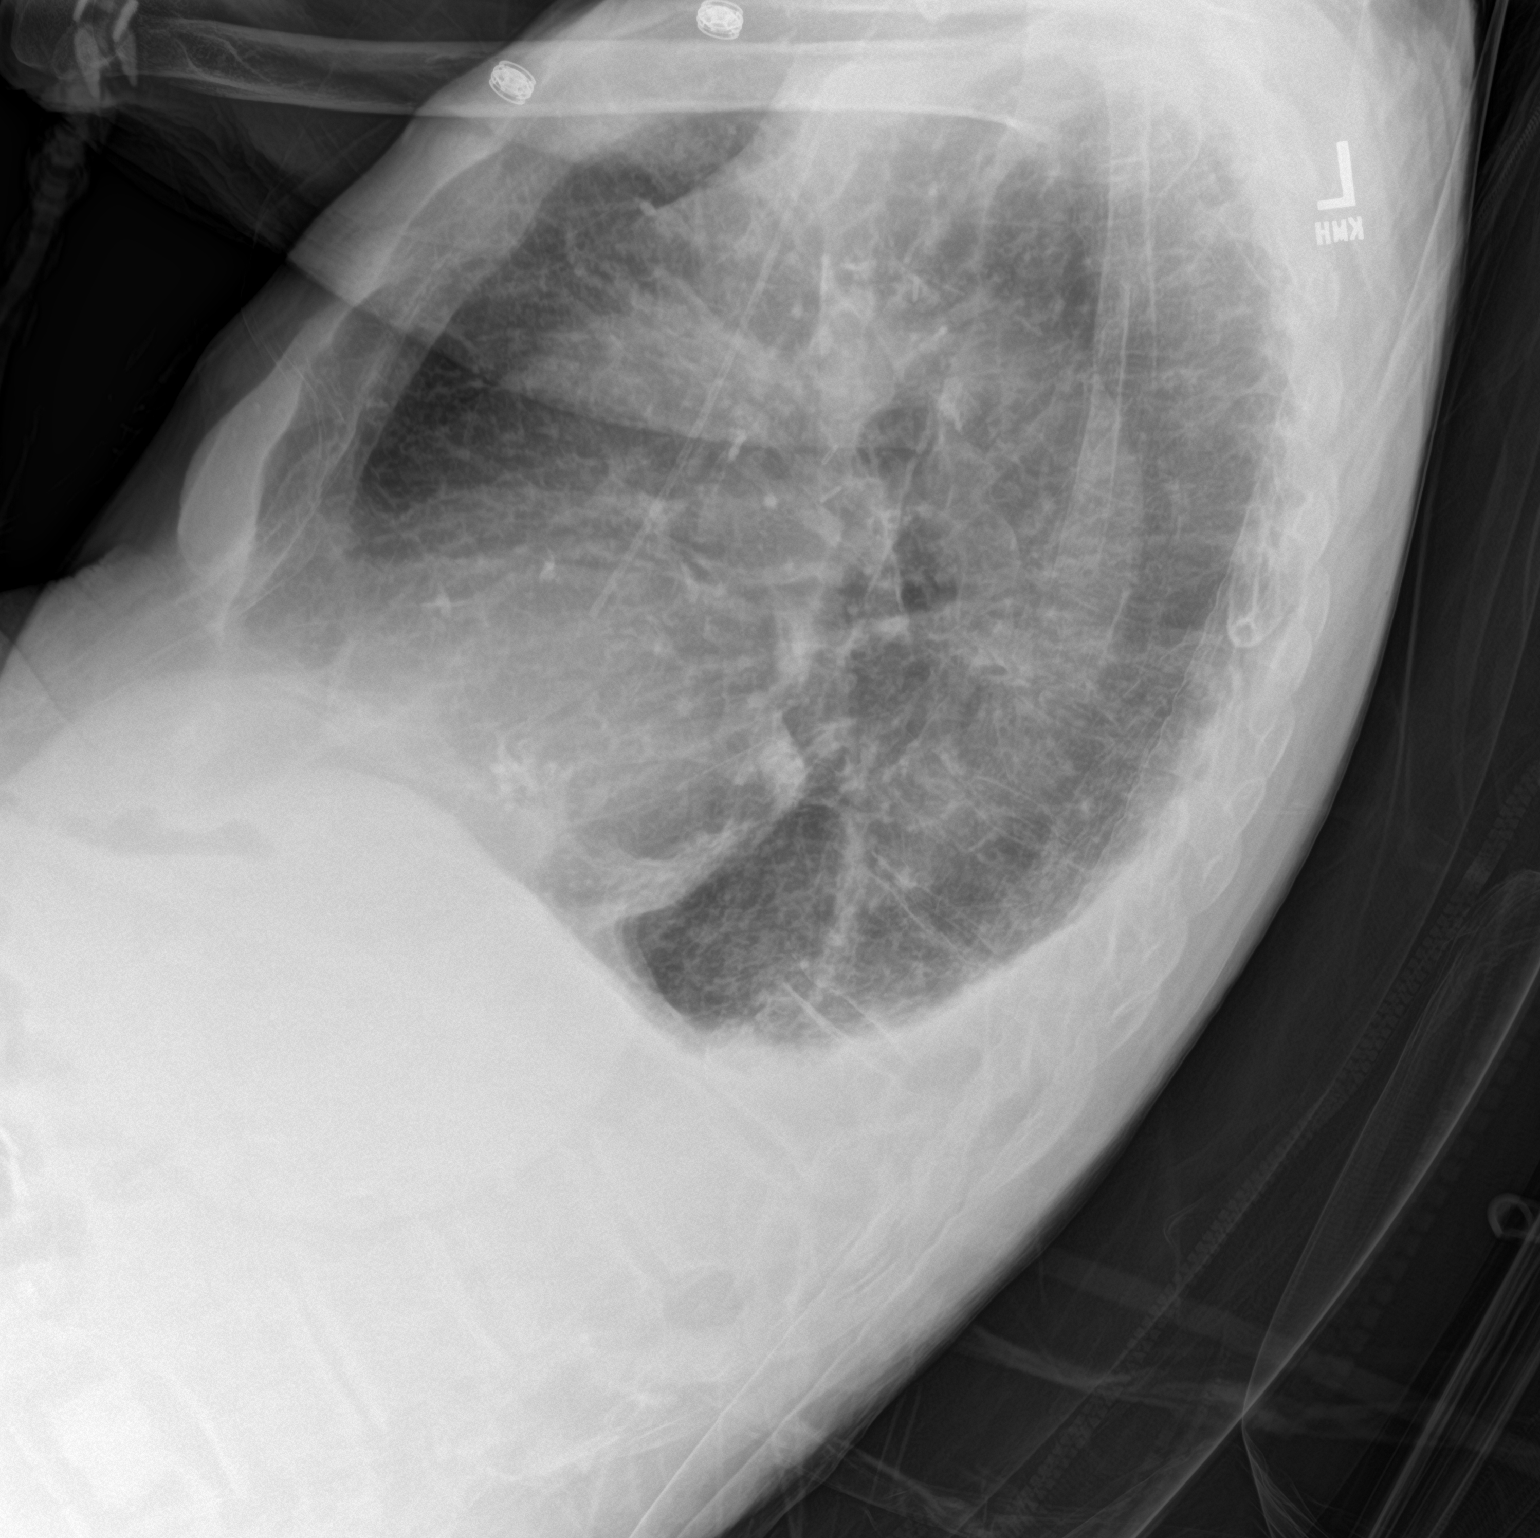

[chest ap]
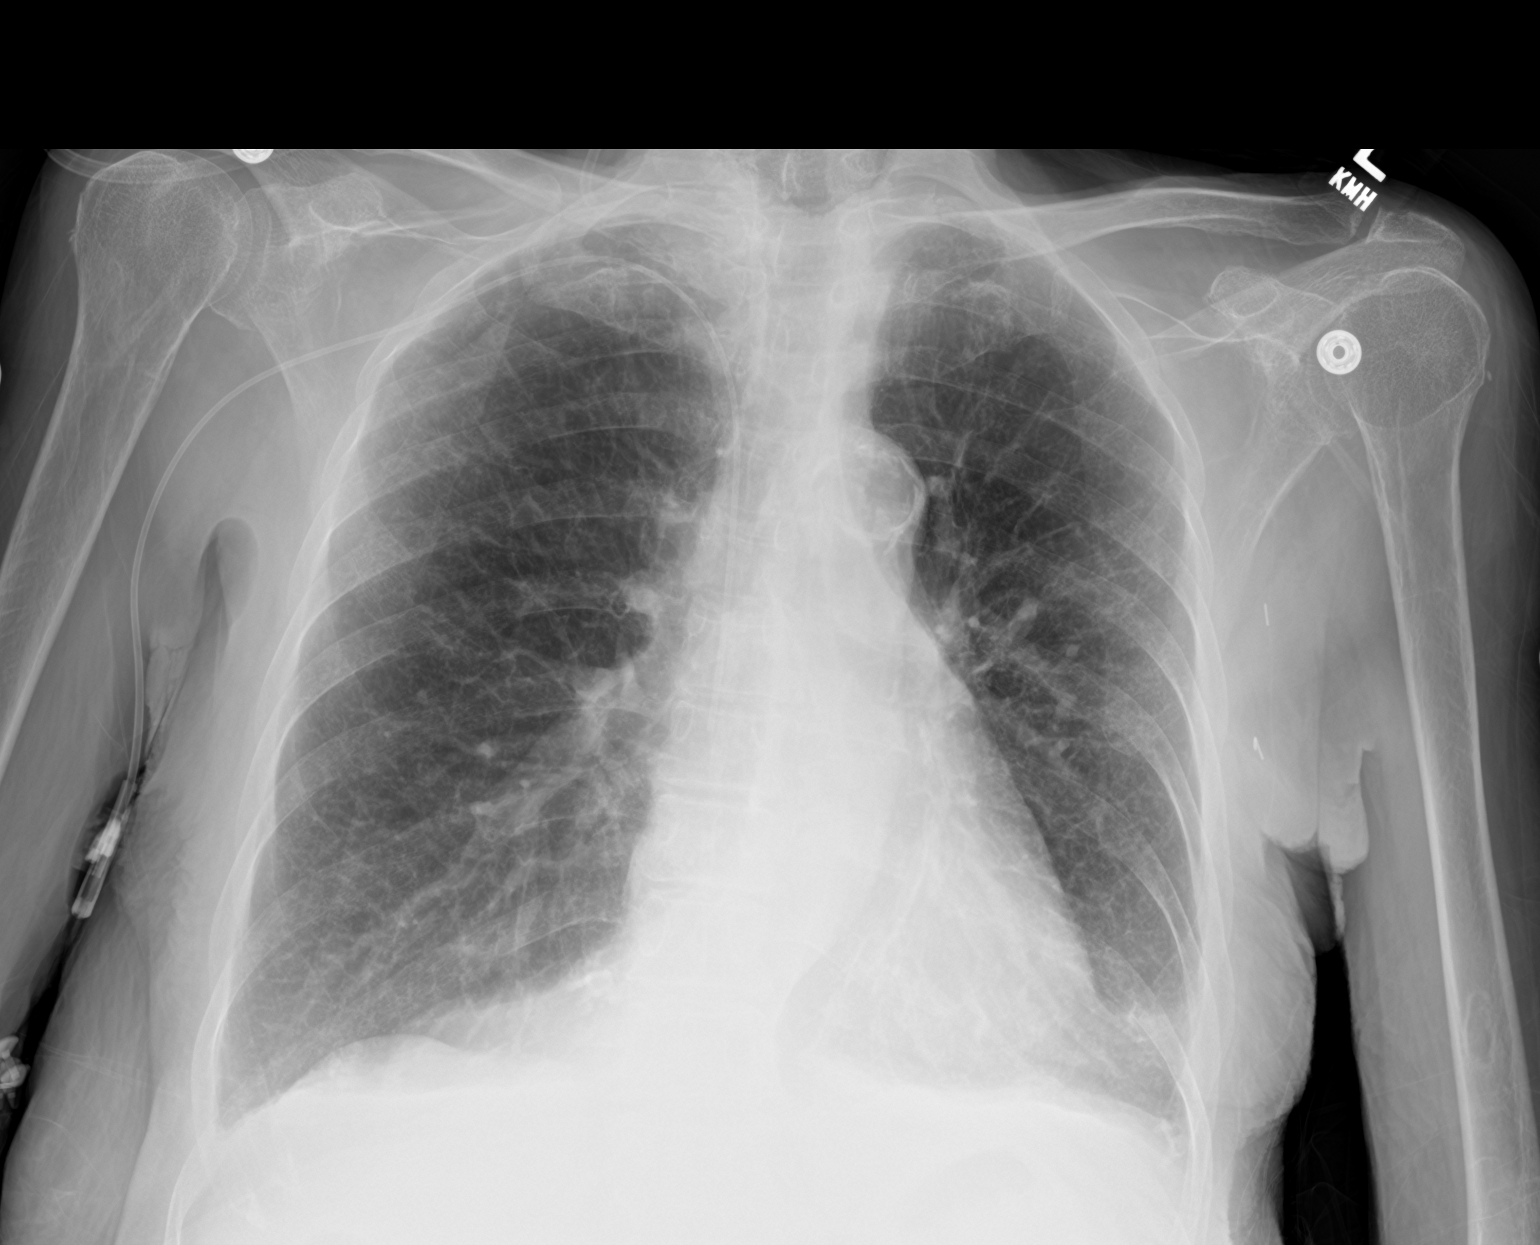

[2 of 2 positions shown; findings below may reference images not displayed]

FINDINGS: The lungs are mildly hyperinflated. The interstitial markings are
coarse. There are small bilateral pleural effusions. The heart and
pulmonary vascularity are normal. There is calcification in the wall
of the thoracic aorta. The right sided PICC line tip projects over
the junction of the middle and distal thirds of the SVC.
IMPRESSION: Chronic bronchitic changes. Slight interval increase in the
pulmonary interstitial markings in the mid and upper lungs may
reflect interstitial edema of noncardiac or cardiac cause. Small
bilateral pleural effusions.

Thoracic aortic atherosclerosis.

## 2018-09-28 IMAGING — DX DG CHEST 1V
1 series · 1 of 1 positions shown · non-contrast
Comparison: 11/18/2017, 11/05/2017, 07/13/2017

CLINICAL DATA: Tachypnea

EXAM:
CHEST  1 VIEW

[chest ap]
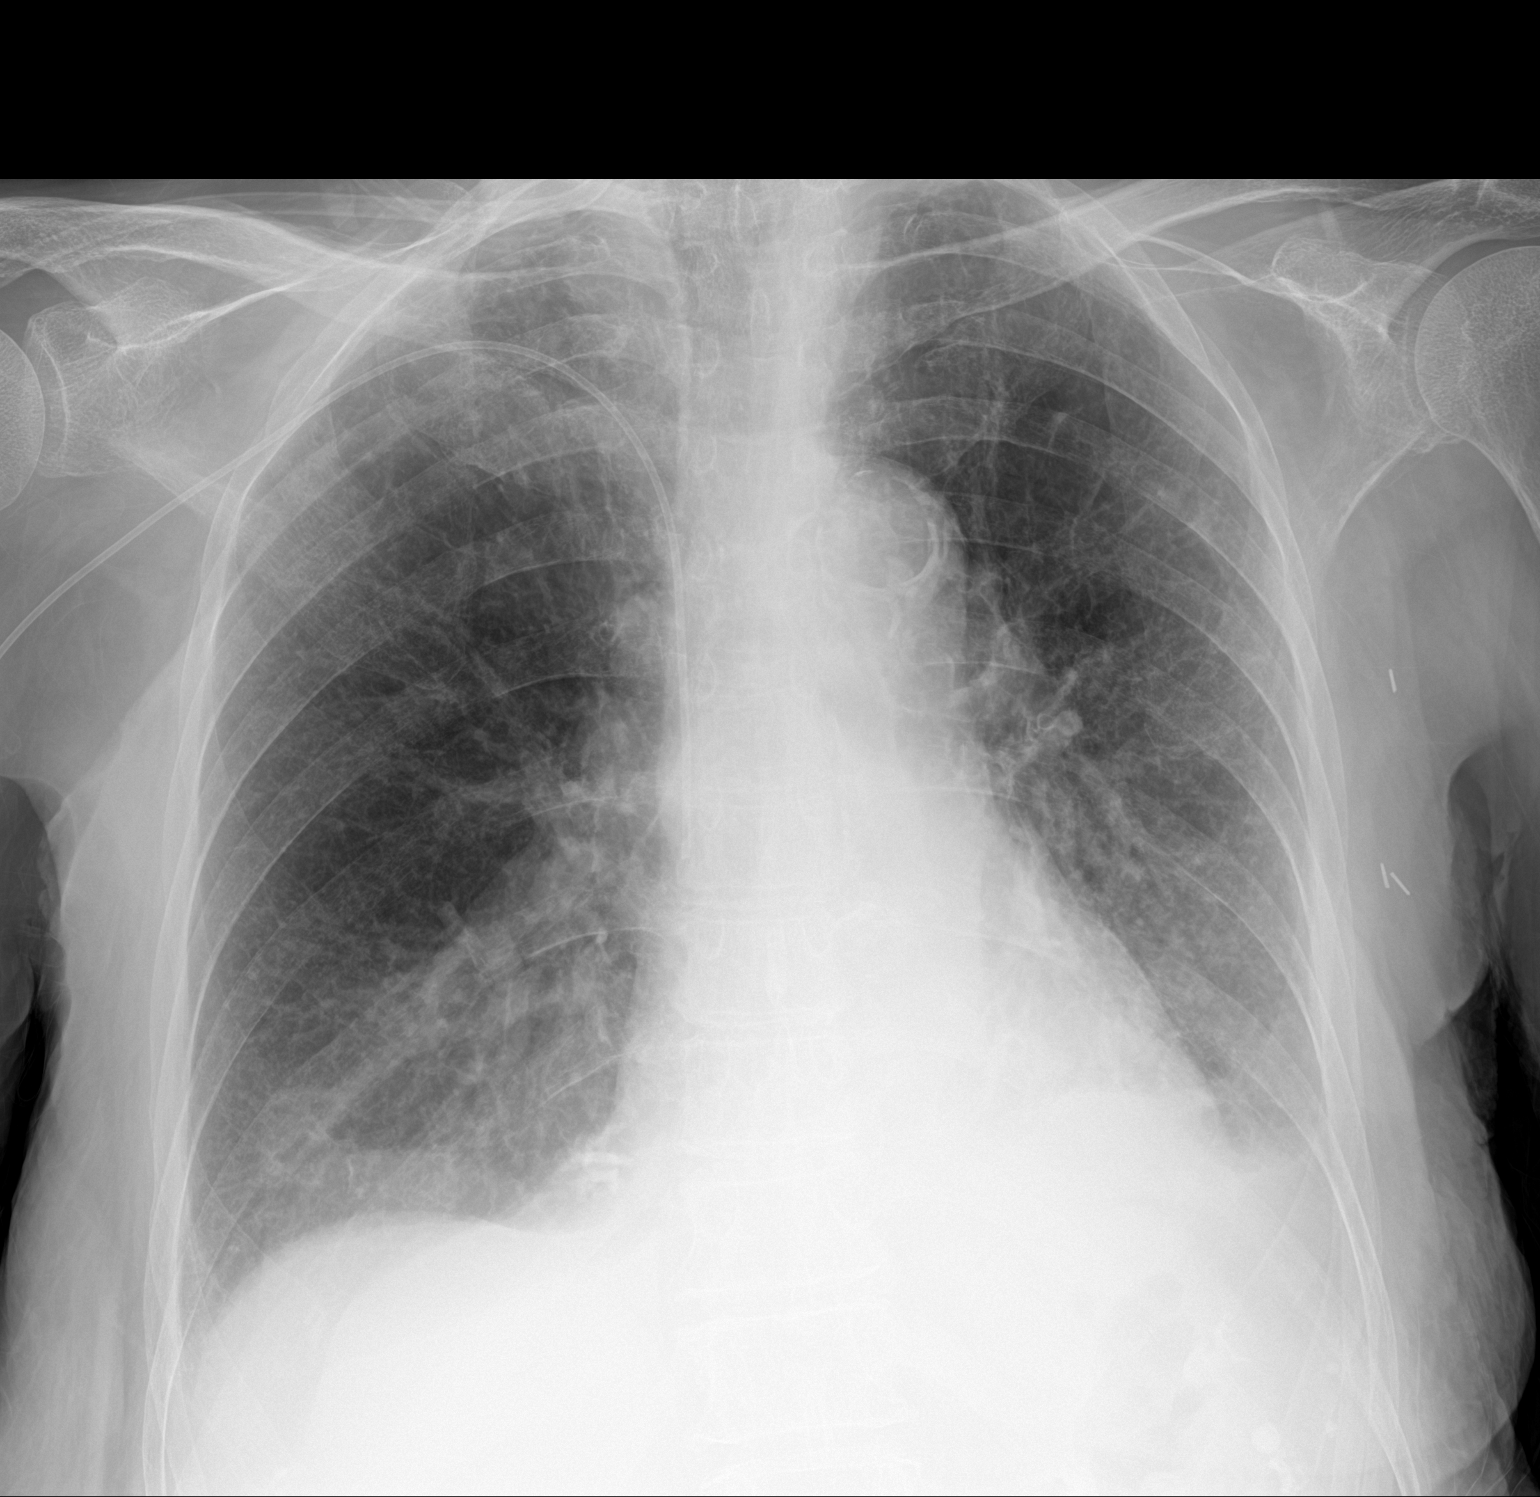

[1 of 1 positions shown; findings below may reference images not displayed]

FINDINGS: Right upper extremity catheter tip over the SVC. Small left pleural
effusion, increased compared to prior. Probable tiny right effusion.
Worsening airspace disease at the left base.

Stable cardiomediastinal silhouette with vascular congestion and
mild interstitial edema. Aortic atherosclerosis. No pneumothorax.
IMPRESSION: 1. Increasing left pleural effusion and left basilar disease.
2. Vascular congestion with increased edema. Suspect small right
pleural effusion.

## 2018-09-30 IMAGING — DX DG CHEST 1V PORT
1 series · 1 of 1 positions shown · non-contrast
Comparison: 11/21/2017

CLINICAL DATA: Shortness of Breath

EXAM:
PORTABLE CHEST 1 VIEW

[chest ap]
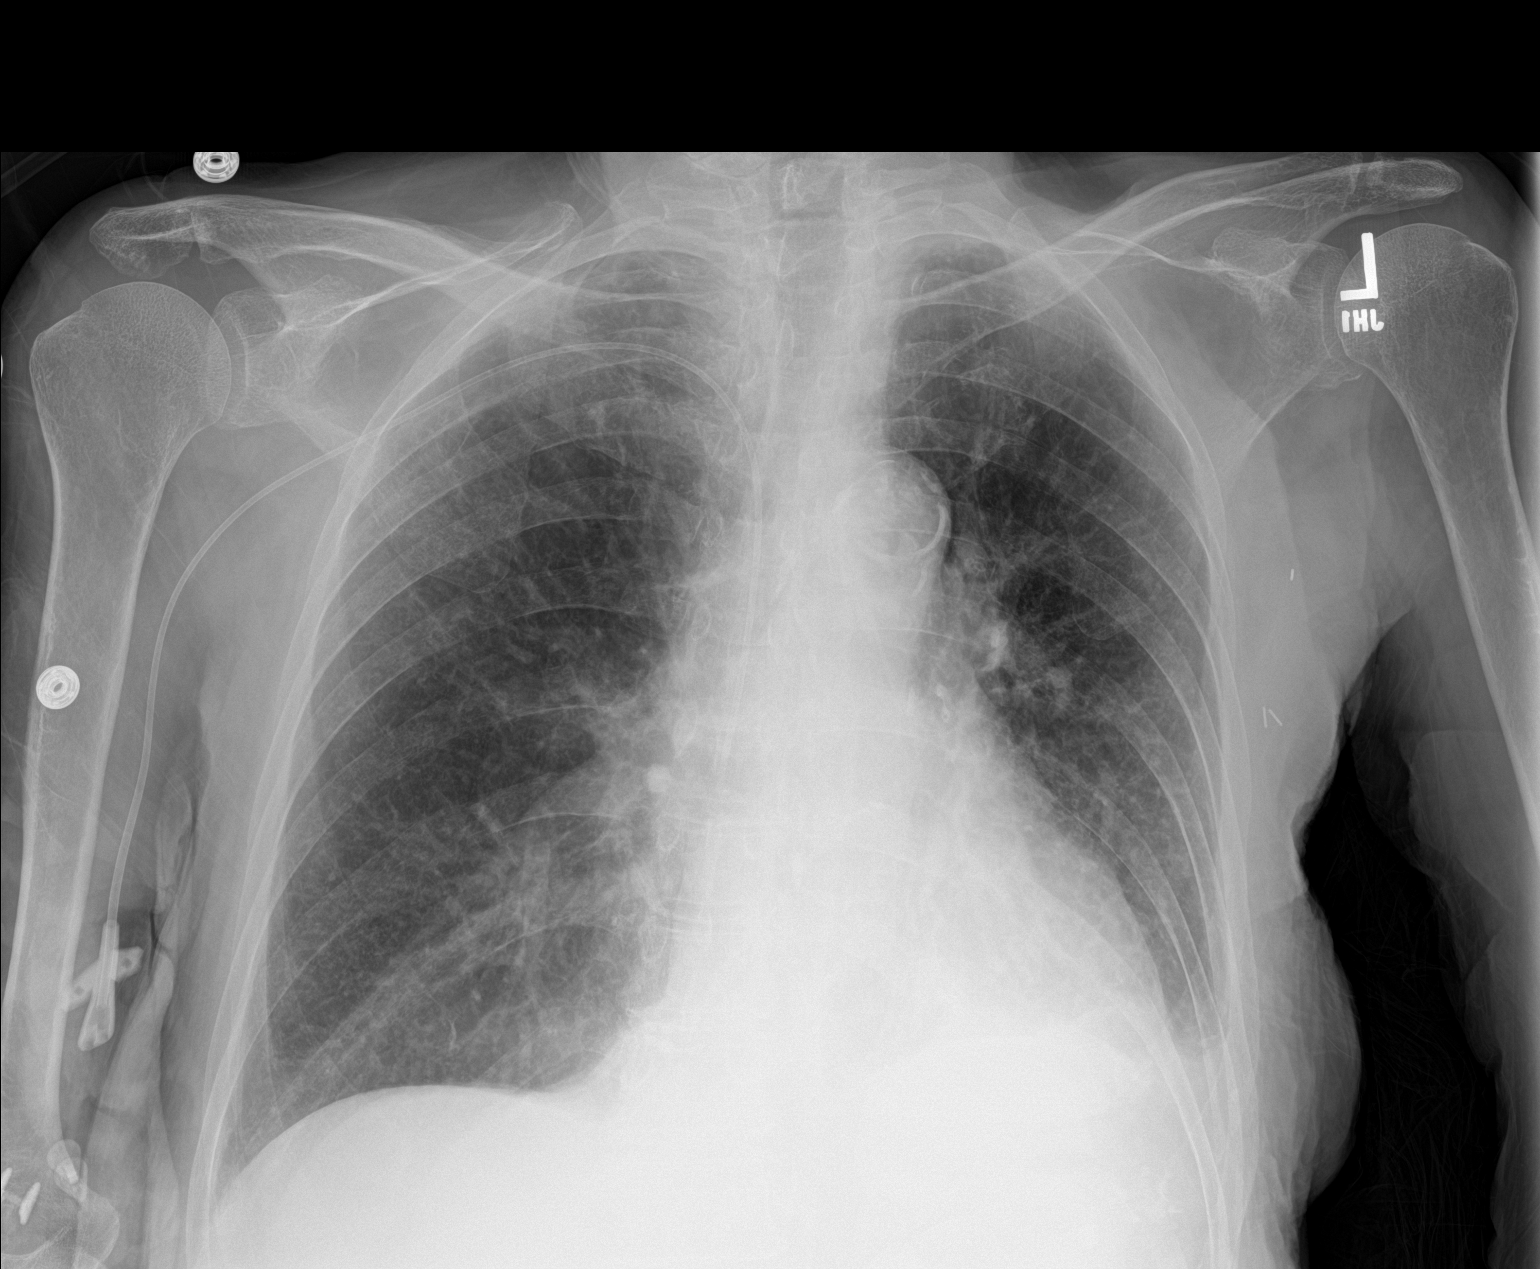

[1 of 1 positions shown; findings below may reference images not displayed]

FINDINGS: Right PICC line remains in place, unchanged. Small left pleural
effusion with left basilar atelectasis or infiltrate. Slight
improvement in aeration at the left base since prior study. No
confluent opacity on the right. Heart is borderline in size.
IMPRESSION: Continue small left effusion with slight improved left base
atelectasis or infiltrate.

## 2021-09-24 DEATH — deceased
